# Patient Record
Sex: Female | Born: 1975 | Race: White | Hispanic: Refuse to answer | Marital: Single | Smoking: Former smoker
Health system: Southern US, Community
[De-identification: ages and names within clinical notes are randomized; demographics above are authoritative.]

## PROBLEM LIST (undated history)

## (undated) DIAGNOSIS — Z9109 Other allergy status, other than to drugs and biological substances: Secondary | ICD-10-CM

## (undated) DIAGNOSIS — E7800 Pure hypercholesterolemia, unspecified: Secondary | ICD-10-CM

## (undated) DIAGNOSIS — R002 Palpitations: Secondary | ICD-10-CM

## (undated) DIAGNOSIS — C801 Malignant (primary) neoplasm, unspecified: Secondary | ICD-10-CM

## (undated) DIAGNOSIS — F411 Generalized anxiety disorder: Secondary | ICD-10-CM

## (undated) DIAGNOSIS — N926 Irregular menstruation, unspecified: Secondary | ICD-10-CM

## (undated) DIAGNOSIS — D72829 Elevated white blood cell count, unspecified: Secondary | ICD-10-CM

## (undated) DIAGNOSIS — I10 Essential (primary) hypertension: Secondary | ICD-10-CM

## (undated) DIAGNOSIS — N2889 Other specified disorders of kidney and ureter: Secondary | ICD-10-CM

## (undated) DIAGNOSIS — E119 Type 2 diabetes mellitus without complications: Secondary | ICD-10-CM

## (undated) DIAGNOSIS — D75839 Thrombocytosis, unspecified: Secondary | ICD-10-CM

## (undated) DIAGNOSIS — K219 Gastro-esophageal reflux disease without esophagitis: Secondary | ICD-10-CM

## (undated) DIAGNOSIS — N8501 Benign endometrial hyperplasia: Secondary | ICD-10-CM

## (undated) DIAGNOSIS — D689 Coagulation defect, unspecified: Secondary | ICD-10-CM

## (undated) DIAGNOSIS — G4700 Insomnia, unspecified: Secondary | ICD-10-CM

## (undated) DIAGNOSIS — T7840XA Allergy, unspecified, initial encounter: Secondary | ICD-10-CM

## (undated) DIAGNOSIS — R197 Diarrhea, unspecified: Secondary | ICD-10-CM

## (undated) DIAGNOSIS — F329 Major depressive disorder, single episode, unspecified: Secondary | ICD-10-CM

## (undated) DIAGNOSIS — E559 Vitamin D deficiency, unspecified: Secondary | ICD-10-CM

## (undated) DIAGNOSIS — G709 Myoneural disorder, unspecified: Secondary | ICD-10-CM

## (undated) DIAGNOSIS — D509 Iron deficiency anemia, unspecified: Secondary | ICD-10-CM

## (undated) DIAGNOSIS — R200 Anesthesia of skin: Secondary | ICD-10-CM

## (undated) DIAGNOSIS — C649 Malignant neoplasm of unspecified kidney, except renal pelvis: Secondary | ICD-10-CM

## (undated) DIAGNOSIS — D649 Anemia, unspecified: Secondary | ICD-10-CM

## (undated) DIAGNOSIS — N8502 Endometrial intraepithelial neoplasia [EIN]: Secondary | ICD-10-CM

## (undated) DIAGNOSIS — R202 Paresthesia of skin: Secondary | ICD-10-CM

## (undated) HISTORY — DX: Allergy, unspecified, initial encounter: T78.40XA

## (undated) HISTORY — DX: Endometrial intraepithelial neoplasia (EIN): N85.02

## (undated) HISTORY — DX: Myoneural disorder, unspecified: G70.9

## (undated) HISTORY — PX: COLONOSCOPY: SHX174

## (undated) HISTORY — DX: Coagulation defect, unspecified: D68.9

## (undated) HISTORY — DX: Generalized anxiety disorder: F41.1

## (undated) HISTORY — DX: Type 2 diabetes mellitus without complications: E11.9

## (undated) HISTORY — DX: Essential (primary) hypertension: I10

## (undated) HISTORY — DX: Gastro-esophageal reflux disease without esophagitis: K21.9

## (undated) HISTORY — DX: Pure hypercholesterolemia, unspecified: E78.00

## (undated) HISTORY — PX: DILATION AND CURETTAGE OF UTERUS: SHX78

## (undated) HISTORY — DX: Other allergy status, other than to drugs and biological substances: Z91.09

## (undated) HISTORY — DX: Major depressive disorder, single episode, unspecified: F32.9

## (undated) HISTORY — DX: Insomnia, unspecified: G47.00

## (undated) HISTORY — PX: HYSTEROSCOPY WITH D & C: SHX1775

---

## 2005-03-16 ENCOUNTER — Emergency Department: Payer: Self-pay | Admitting: Emergency Medicine

## 2005-03-16 ENCOUNTER — Other Ambulatory Visit: Payer: Self-pay

## 2005-08-25 LAB — HM COLONOSCOPY

## 2006-01-30 ENCOUNTER — Ambulatory Visit: Payer: Self-pay | Admitting: Emergency Medicine

## 2006-01-30 ENCOUNTER — Emergency Department: Payer: Self-pay | Admitting: Emergency Medicine

## 2006-02-04 ENCOUNTER — Ambulatory Visit: Payer: Self-pay | Admitting: Urology

## 2006-03-21 ENCOUNTER — Ambulatory Visit: Payer: Self-pay | Admitting: Gastroenterology

## 2007-01-21 IMAGING — CT CT ABD-PELV W/O CM
1 of 2 series · 14 of 32 positions shown, 18 images · non-contrast
Comparison: none

REASON FOR EXAM: kidney stone
COMMENTS:

[Series 2: soft tissue · axial · 0.79mm/px · z∈[-503,-68]mm · 14 of 165 slices shown, 18 images]
[im 13/165  soft-tissue]
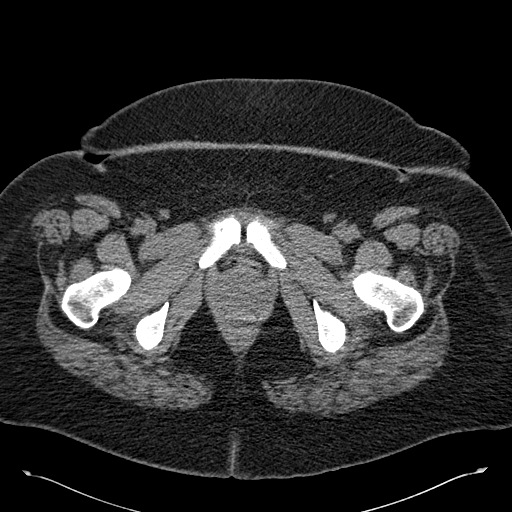
[im 13/165  bone]
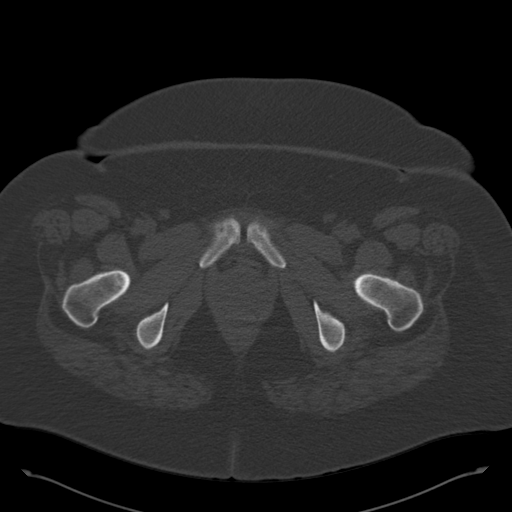
[im 25/165  soft-tissue]
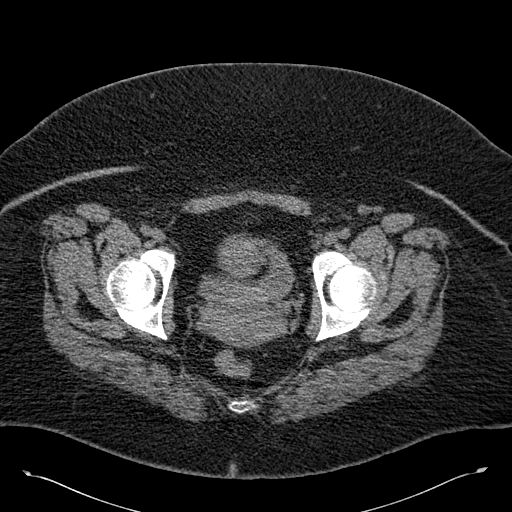
[im 37/165  soft-tissue]
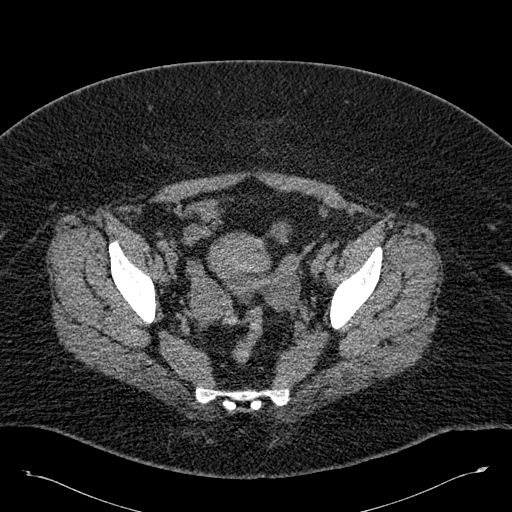
[im 49/165  soft-tissue]
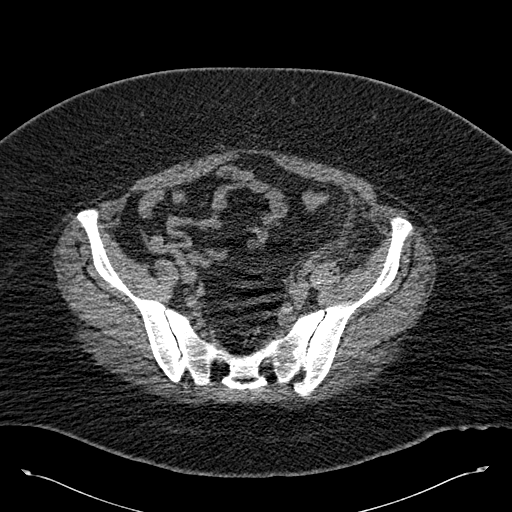
[im 61/165  soft-tissue]
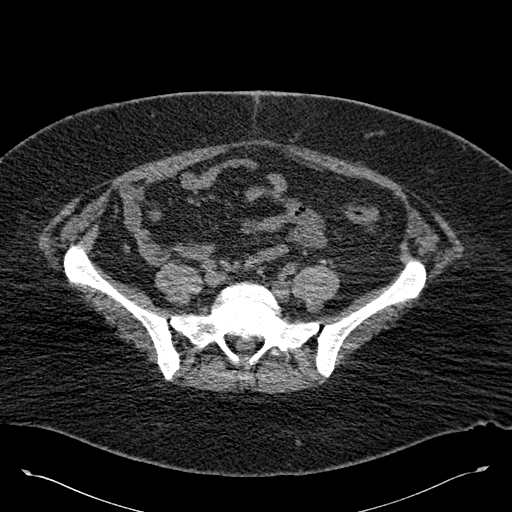
[im 73/165  soft-tissue]
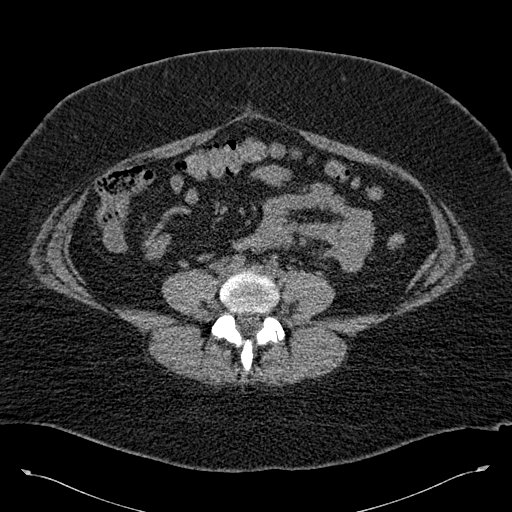
[im 92/165  soft-tissue]
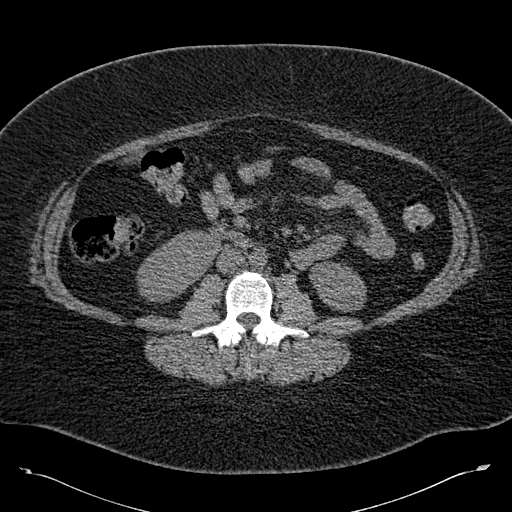
[im 104/165  soft-tissue]
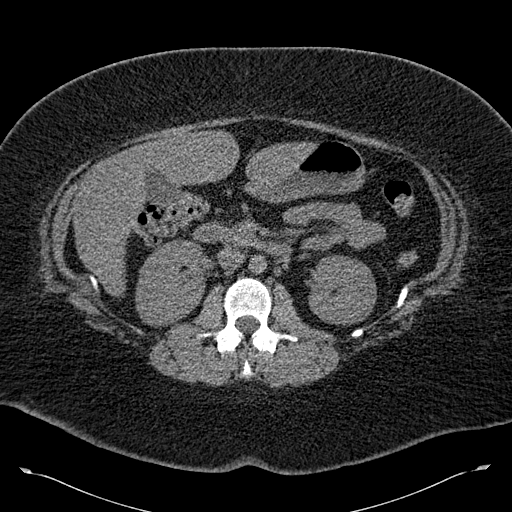
[im 116/165  soft-tissue]
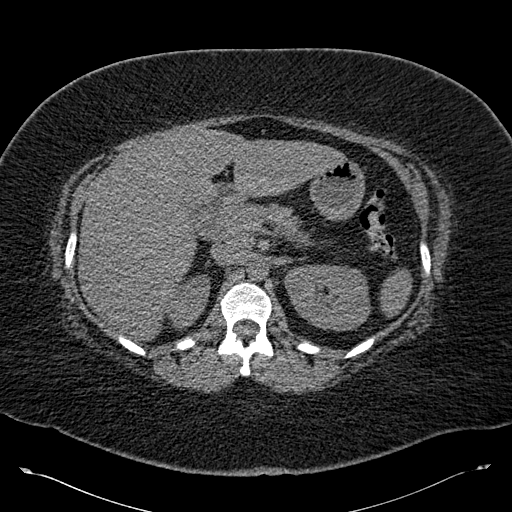
[im 116/165  bone]
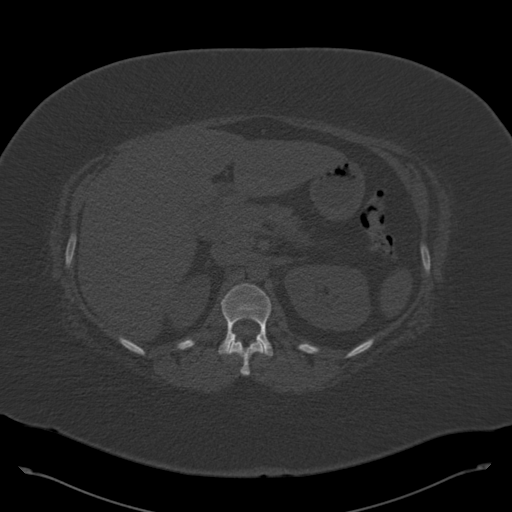
[im 128/165  soft-tissue]
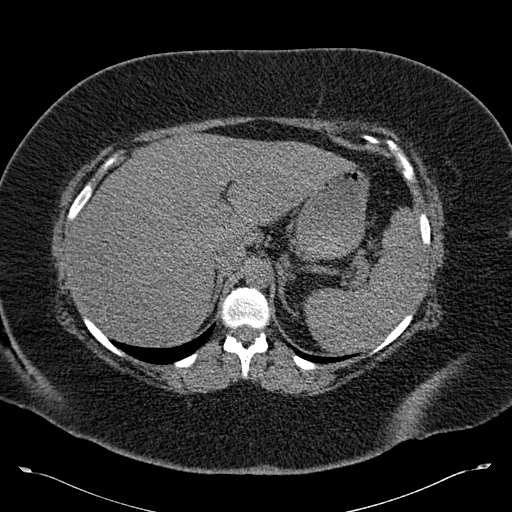
[im 140/165  soft-tissue]
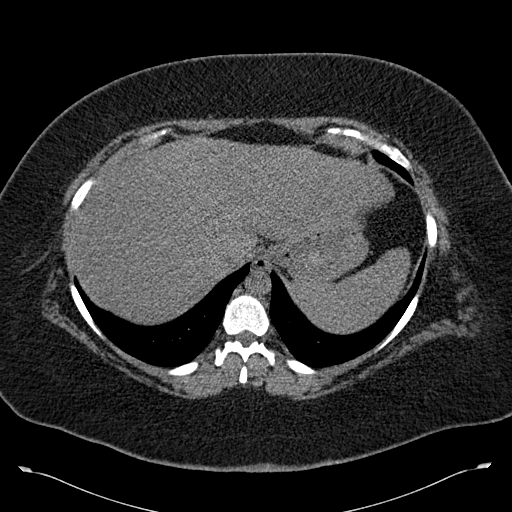
[im 140/165  lung]
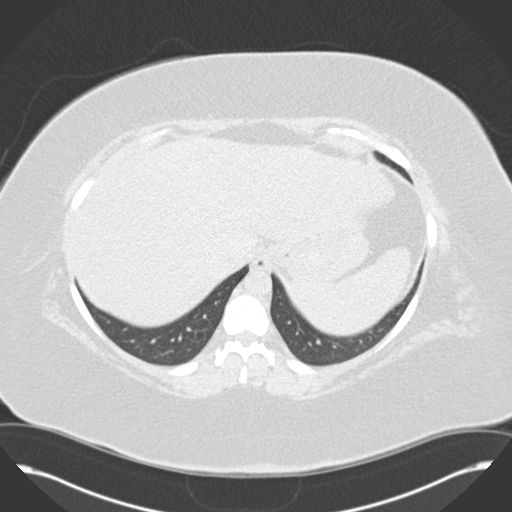
[im 146/165  lung]
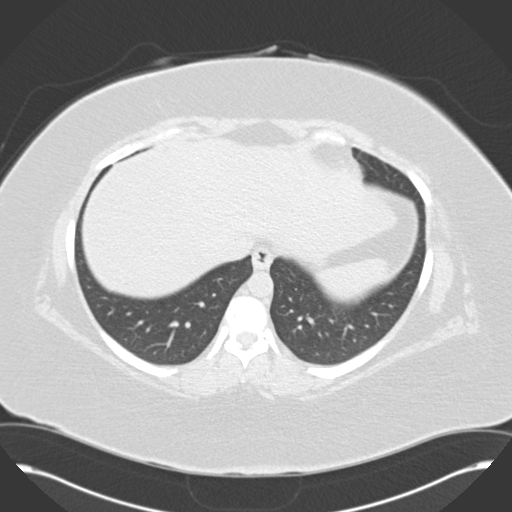
[im 152/165  soft-tissue]
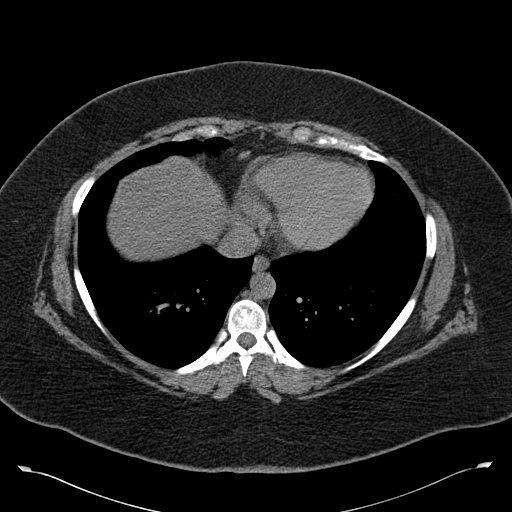
[im 152/165  lung]
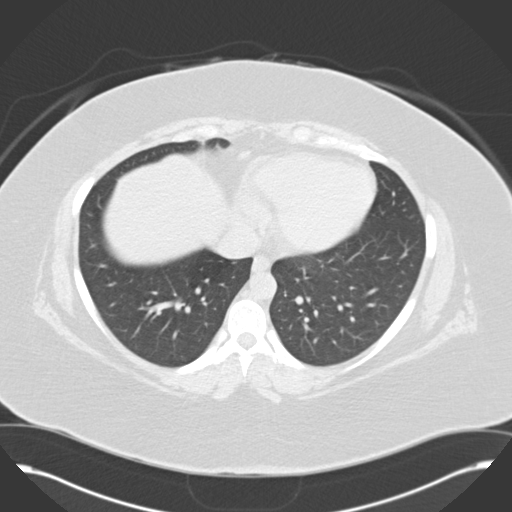
[im 158/165  lung]
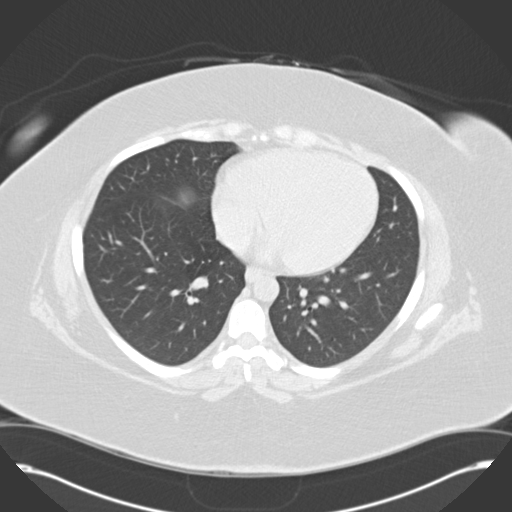

[14 of 32 positions shown; findings below may reference images not displayed]

PROCEDURE:     CT  - CT ABDOMEN AND PELVIS W[DATE] [DATE]

RESULT:     Spiral 3 mm sections were obtained from the lung bases through
the pubic symphysis without the administration of oral or intravenous
contrast.

This study is degraded secondary to the patient's body habitus. The patient
is morbidly obese.

Evaluation of the lung bases demonstrates no gross abnormalities.

Evaluation of the LEFT kidney demonstrates no evidence of hydronephrosis nor
gross evidence of renal masses nor calculi.  The ureter is fully identified
secondary to the decreased spatial resolution of this study due to the
patient's morbid obesity.  Within the region of the distal LEFT ureter at
the level of the pelvic inlet a small focal area of high-density material
projects measuring approximately .4 mm in diameter. The findings are
suspicious for a non-obstructing distal LEFT ureteral calculus.  There is no
evidence of hydroureter nor further regions of LEFT ureterolithiasis.
Evaluation of the RIGHT kidney demonstrates no evidence of hydronephrosis,
masses or calculi.  There is no evidence of RIGHT ureterolithiasis nor
hydronephrosis.

Non-contrast evaluation of the liver, spleen, adrenals, pancreas is
unremarkable.  The LEFT lower quadrant/upper pelvis region demonstrates
minimal diverticulosis involving the distal descending/upper sigmoid colon.
There is minimal inflammatory change within the surrounding mesenteric fat
as well as a small amount of fluid.  This finding may represent the sequela
of early or mild diverticulitis.  No drainable loculated fluid collections
are identified and clinical correlation is recommended.
IMPRESSION: 1)Findings suspicious for a distal LEFT ureteral calculus as described
above. There is no evidence of significant obstructive associated
obstructive uropathy.

2)Findings which raise the suspicion of early or mild diverticulitis
involving the proximal sigmoid colon.  As noted above this study is degraded
secondary to body habitus.  No drainable loculated fluid collections are
associated with the region of mild/early changes within the LEFT lower
quadrant. Alternatively, this region may represent an area of resolving
diverticulitis particularly if the patient has received antibiotic therapy.

## 2012-06-06 ENCOUNTER — Telehealth: Payer: Self-pay | Admitting: Internal Medicine

## 2012-06-06 NOTE — Telephone Encounter (Signed)
Ok x 2  

## 2012-06-06 NOTE — Telephone Encounter (Signed)
Patient needing refills until she can be seen. Losartan 50 mg 1 time a day.

## 2012-06-12 ENCOUNTER — Other Ambulatory Visit: Payer: Self-pay | Admitting: *Deleted

## 2012-06-12 NOTE — Telephone Encounter (Signed)
Left message with patient requesting pharmacy name

## 2012-06-16 NOTE — Telephone Encounter (Signed)
Dr. Lorin Picket took care of this per note she gave me.

## 2012-06-18 ENCOUNTER — Telehealth: Payer: Self-pay | Admitting: *Deleted

## 2012-06-18 ENCOUNTER — Encounter: Payer: Self-pay | Admitting: *Deleted

## 2012-06-18 NOTE — Telephone Encounter (Signed)
No answer from patient home phone in regards to pharmacy name for Losartan script

## 2012-06-26 ENCOUNTER — Other Ambulatory Visit: Payer: Self-pay | Admitting: Internal Medicine

## 2012-06-26 NOTE — Telephone Encounter (Signed)
Pt is needing a refill on Metformin. She uses [address]

## 2012-06-26 NOTE — Telephone Encounter (Signed)
Ok to refill x 3.  Will need to clarify the directions with the pt or [facility].

## 2012-06-27 ENCOUNTER — Telehealth: Payer: Self-pay | Admitting: Internal Medicine

## 2012-06-27 NOTE — Telephone Encounter (Signed)
Pt called back and says if we could check with the pharmacy on the directions to clarify and she will be out of pills tonight.

## 2012-06-27 NOTE — Telephone Encounter (Signed)
Please call [facility] and confirm dose of med she is taking and ok to refill x 3.  Pt will be out tonight.

## 2012-06-27 NOTE — Telephone Encounter (Signed)
Called into pharmacy

## 2012-06-27 NOTE — Telephone Encounter (Signed)
error 

## 2012-08-05 ENCOUNTER — Other Ambulatory Visit: Payer: Self-pay | Admitting: Internal Medicine

## 2012-08-05 ENCOUNTER — Other Ambulatory Visit: Payer: Self-pay | Admitting: *Deleted

## 2012-08-05 MED ORDER — AMLODIPINE BESYLATE 5 MG PO TABS: 5.0000 mg | ORAL_TABLET | Freq: Two times a day (BID) | ORAL | Status: DC

## 2012-08-05 NOTE — Telephone Encounter (Signed)
Filled script for med until appoint.with Dr. Lorin Picket

## 2012-08-05 NOTE — Telephone Encounter (Signed)
Ok to refill until her appt.  Will need to clarify with pharmacy how she takes - I assume she is taking 5mg  q day, but need to confirm

## 2012-08-05 NOTE — Telephone Encounter (Signed)
Pt is needing refill on Amlodapine 5 mg. She uses [facility] on [address]

## 2012-08-22 ENCOUNTER — Encounter: Payer: Self-pay | Admitting: Internal Medicine

## 2012-08-25 ENCOUNTER — Encounter: Payer: Self-pay | Admitting: Internal Medicine

## 2012-08-25 ENCOUNTER — Ambulatory Visit (INDEPENDENT_AMBULATORY_CARE_PROVIDER_SITE_OTHER): Payer: BC Managed Care – PPO | Admitting: Internal Medicine

## 2012-08-25 VITALS — BP 140/70 | HR 105 | Temp 99.0°F | Ht 67.5 in | Wt 301.8 lb

## 2012-08-25 DIAGNOSIS — I10 Essential (primary) hypertension: Secondary | ICD-10-CM | POA: Insufficient documentation

## 2012-08-25 DIAGNOSIS — K219 Gastro-esophageal reflux disease without esophagitis: Secondary | ICD-10-CM | POA: Insufficient documentation

## 2012-08-25 DIAGNOSIS — R5383 Other fatigue: Secondary | ICD-10-CM

## 2012-08-25 DIAGNOSIS — E119 Type 2 diabetes mellitus without complications: Secondary | ICD-10-CM

## 2012-08-25 DIAGNOSIS — E7800 Pure hypercholesterolemia, unspecified: Secondary | ICD-10-CM | POA: Insufficient documentation

## 2012-08-25 MED ORDER — FLUTICASONE PROPIONATE 50 MCG/ACT NA SUSP: 2.0000 | Freq: Every day | NASAL | Status: DC

## 2012-08-25 MED ORDER — AMLODIPINE BESYLATE 5 MG PO TABS: 5.0000 mg | ORAL_TABLET | Freq: Two times a day (BID) | ORAL | Status: DC

## 2012-08-26 ENCOUNTER — Telehealth: Payer: Self-pay | Admitting: Internal Medicine

## 2012-08-26 MED ORDER — METOPROLOL SUCCINATE ER 100 MG PO TB24: 100.0000 mg | ORAL_TABLET | Freq: Every day | ORAL | Status: DC

## 2012-08-26 NOTE — Telephone Encounter (Signed)
Sent in to pharmacy.  

## 2012-08-26 NOTE — Telephone Encounter (Signed)
metoprolol succinate (TOPROL-XL) 100 MG 24 hr tablet   Patient is out of he medication needing refill called in today.

## 2012-09-04 ENCOUNTER — Encounter: Payer: Self-pay | Admitting: Internal Medicine

## 2012-09-04 NOTE — Assessment & Plan Note (Signed)
Low cholesterol diet and exercise.  Check lipid panel.   

## 2012-09-04 NOTE — Progress Notes (Signed)
Subjective:    Patient ID: Lindsey Strickland, female    DOB: October 25, 1975, 37 y.o.   MRN: [id number]  HPI 37 year old female with past history of hypertension, hypercholesterolemia, GERD, diabetes and reoccurring allergy and sinus issues.  She comes in today for a scheduled follow up.  Declined to have her physical today.  States she will do her physical - next visit.  Does report some increased drainage.  No significant sinus pressure.  Request refill on her Flonase.  No chest congestion.  No increased cough or sob.  No significant acid reflux reported.  On Zantac.  Bowels stable.  No watching what she eats.  Has gained some of her weight back.  No urine change.  She does report some tingling in her toes.  Also will notice a sharp pain in the bottom of her feet.  Just occurs intermittently.  No precipitating factors.  Some fatigue, but overall she feels she is doing relatively well.  Plans to get more serious about her diet and exercise.    Past Medical History  Diagnosis Date  . Hypertension   . Hypercholesterolemia   . GERD (gastroesophageal reflux disease)   . Environmental allergies     Current Outpatient Prescriptions on File Prior to Visit  Medication Sig Dispense Refill  . amLODipine (NORVASC) 5 MG tablet Take 1 tablet (5 mg total) by mouth 2 (two) times daily.  60 tablet  4  . glucose blood test strip 1 each by Other route 2 (two) times daily. Use as instructed, use one strip twice a day      . Lancets MISC by Does not apply route. Use 1 lancets three times a day      . losartan (COZAAR) 50 MG tablet Take 50 mg by mouth daily.      . metFORMIN (GLUCOPHAGE) 500 MG tablet Take 500 mg by mouth 2 (two) times daily with a meal.       . ranitidine (ZANTAC) 150 MG tablet Take 150 mg by mouth once. Take 1 tablet by mouth once a day      . fluticasone (FLONASE) 50 MCG/ACT nasal spray Place 2 sprays into the nose daily.  16 g  3  . metoprolol succinate (TOPROL-XL) 100 MG 24 hr tablet 1/2 tablet  bid        Review of Systems Patient denies any headache, lightheadedness or dizziness. Some postnasal drainage.  No sinus pressure of fever/chills.  No chest pain, tightness or palpitations.  No increased shortness of breath, cough or congestion.  No nausea or vomiting.  No abdominal pain or cramping.  No bowel change, such as diarrhea, constipation, BRBPR or melana.  No urine change.        Objective:   Physical Exam Filed Vitals:   08/25/12 0809  BP: 140/70  Pulse: 105  Temp: 99 F (37.2 C)   Blood pressure recheck:  140/82, pulse 49  37 year old female in no acute distress.   HEENT:  Nares- clear.  Oropharynx - without lesions. NECK:  Supple.  Nontender.  No audible bruit.  HEART:  Appears to be regular. LUNGS:  No crackles or wheezing audible.  Respirations even and unlabored.  RADIAL PULSE:  Equal bilaterally.   ABDOMEN:  Soft, nontender.  Bowel sounds present and normal.  No audible abdominal bruit.    EXTREMITIES:  No increased edema present.  DP pulses palpable and equal bilaterally.  No lesions.  Non tender to palpation.  NEURO:  Sensation  intact to pinprick and light touch.            Assessment & Plan:  ALLERGIES.  Flonase as directed.  Follow.    GI.  Stable.  FATIGUE.  Check cbc, met c and tsh.     INCREASED PSYCHOSOCIAL STRESSORS.  Doing well.  Feels she is handling things relatively well.  Follow.    GYN.  Schedule pelvic and pap next visit.    FOOT PAIN.  No pain on exam today.  Some tingling.  Exam as outlined.  Support shoes.  Follow.    HEALTH MAINTENANCE.  Schedule her for a physical next visit.

## 2012-09-04 NOTE — Assessment & Plan Note (Signed)
Blood pressure a little elevated today.  Have her spot check her pressure.  Check metabolic panel.  Get her back in soon to reassess.  Make adjustments in her medication regimen - if needed.

## 2012-09-04 NOTE — Assessment & Plan Note (Signed)
Symptoms controlled.  On zantac.  Follow.    

## 2012-09-04 NOTE — Assessment & Plan Note (Signed)
On metformin.  Brought in no recorded sugar readings.  No watching her diet and not exercising.  Discussed diet, exercise and weight loss.  Check sugars.  Will check metabolic panel and a1c.

## 2012-09-05 ENCOUNTER — Telehealth: Payer: Self-pay | Admitting: Internal Medicine

## 2012-09-05 MED ORDER — LOSARTAN POTASSIUM 50 MG PO TABS: 50.0000 mg | ORAL_TABLET | Freq: Every day | ORAL | Status: DC

## 2012-09-05 NOTE — Telephone Encounter (Signed)
Msg. left on patient's voicemail that script has been called in per Dr. Lorin Picket to [facility]

## 2012-09-05 NOTE — Telephone Encounter (Signed)
Pt needing refill on Losartin 50 mg and she uses [facility] on [address] Pt on last pill

## 2012-09-05 NOTE — Telephone Encounter (Signed)
rx sent [facility] to [facility].  Can notify pt.

## 2012-09-29 ENCOUNTER — Ambulatory Visit: Payer: BC Managed Care – PPO | Admitting: Adult Health

## 2012-09-29 ENCOUNTER — Ambulatory Visit (INDEPENDENT_AMBULATORY_CARE_PROVIDER_SITE_OTHER): Payer: BC Managed Care – PPO | Admitting: Adult Health

## 2012-09-29 ENCOUNTER — Telehealth: Payer: Self-pay | Admitting: Internal Medicine

## 2012-09-29 ENCOUNTER — Encounter: Payer: Self-pay | Admitting: Adult Health

## 2012-09-29 VITALS — BP 154/78 | HR 92 | Temp 99.8°F | Resp 16 | Wt 299.0 lb

## 2012-09-29 DIAGNOSIS — J22 Unspecified acute lower respiratory infection: Secondary | ICD-10-CM

## 2012-09-29 MED ORDER — ALBUTEROL SULFATE HFA 108 (90 BASE) MCG/ACT IN AERS: 2.0000 | INHALATION_SPRAY | Freq: Four times a day (QID) | RESPIRATORY_TRACT | Status: DC | PRN

## 2012-09-29 MED ORDER — LEVOFLOXACIN 750 MG PO TABS: 750.0000 mg | ORAL_TABLET | Freq: Every day | ORAL | Status: DC

## 2012-09-29 NOTE — Patient Instructions (Addendum)
  Please stop the omnicef and start Levaquin. You will take this daily for 5 days.  Use the albuterol inhaler for the next 3 days. Use every 6 hours. Then use inhaler as needed for shortness of breath or wheezing.  Continue to drink fluids.  Please stay out of work until Thursday. If you are still running a fever, stay home.  If you are not better by Thursday, please call the office.

## 2012-09-29 NOTE — Telephone Encounter (Signed)
Patient Information:  Caller Name: Ashlen  Phone: [phone]  Patient: Lindsey Strickland, Lindsey Strickland  Gender: Female  DOB: Oct 22, 1975  Age: 37 Years  PCP: Dale Duncombe  Pregnant: No  Office Follow Up:  Does the office need to follow up with this patient?: No  Instructions For The Office: N/A  RN Note:  Pt diagnosed w/ Bronchitis on 2-21, Pt feels she is having SOB and Wheezing.  No audible wheezing heard over the phone, Pt speaking full sentences, seem anxious.  Pt has appt at 1545 on 2-24, Pt would like to come to office now.  No same day appts remaining.  Advised Pt to have herbal tea w/ honey and to keep 1545 appt on 2-24.  Pt verbalized understanding.   Symptoms  Reason For Call & Symptoms: ER CALL. Bronchitis follow up and Wheezing  Reviewed Health History In EMR: N/A  Reviewed Medications In EMR: N/A  Reviewed Allergies In EMR: N/A  Reviewed Surgeries / Procedures: N/A  Date of Onset of Symptoms: 09/26/2012  Any Fever: Yes  Fever Taken: Oral  Fever Time Of Reading: 14:00:08  Fever Last Reading: 100.5 OB / GYN:  LMP: Unknown  Guideline(s) Used:  Breathing Difficulty  Disposition Per Guideline:   Go to Office Now  Reason For Disposition Reached:   Mild difficulty breathing (e.g., minimal/no SOB at rest, SOB with walking, pulse < 100) of new onset or worse than normal  Advice Given:  N/A

## 2012-09-29 NOTE — Progress Notes (Signed)
  Subjective:    Patient ID: Lindsey Strickland, female    DOB: 06-Jul-1976, 37 y.o.   MRN: [id number]  HPI  Patient is a 37 y/o female with hx of DM, HTN, HLD who presents to office today following a visit to urgent care for cough, congestion, fever, chills this past Friday. She was diagnosed with bronchitis and was started on Omnicef 300 mg bid. She reports that her fever has actually been higher over the weekend (101.0). She has been using flonase and taking mucinex. Her sputum is green. She is not feeling any better. She is very concerned so she is here for f/u evaluation.   Current Outpatient Prescriptions on File Prior to Visit  Medication Sig Dispense Refill  . amLODipine (NORVASC) 5 MG tablet Take 1 tablet (5 mg total) by mouth 2 (two) times daily.  60 tablet  4  . fluticasone (FLONASE) 50 MCG/ACT nasal spray Place 2 sprays into the nose daily.  16 g  3  . glucose blood test strip 1 each by Other route 2 (two) times daily. Use as instructed, use one strip twice a day      . Lancets MISC by Does not apply route. Use 1 lancets three times a day      . losartan (COZAAR) 50 MG tablet Take 1 tablet (50 mg total) by mouth daily.  30 tablet  5  . metFORMIN (GLUCOPHAGE) 500 MG tablet Take 500 mg by mouth 2 (two) times daily with a meal.       . metoprolol succinate (TOPROL-XL) 100 MG 24 hr tablet 1/2 tablet bid      . ranitidine (ZANTAC) 150 MG tablet Take 150 mg by mouth once. Take 1 tablet by mouth once a day      . fluticasone (FLONASE) 50 MCG/ACT nasal spray Place 2 sprays into the nose daily.       No current facility-administered medications on file prior to visit.     Review of Systems  Constitutional: Positive for fever, chills and fatigue.  HENT: Positive for congestion. Negative for sore throat, rhinorrhea and sinus pressure.   Respiratory: Positive for cough, chest tightness and shortness of breath. Negative for wheezing.   Gastrointestinal: Negative for nausea and vomiting.     BP 154/78  Pulse 92  Temp(Src) 99.8 F (37.7 C) (Oral)  Resp 16  Wt 299 lb (135.626 kg)  BMI 46.11 kg/m2  SpO2 96%  LMP 09/25/2012     Objective:   Physical Exam  Constitutional: She is oriented to person, place, and time. No distress.  Overweight. Pleasant  Cardiovascular: Normal rate, regular rhythm and normal heart sounds.   Pulmonary/Chest: Effort normal. No respiratory distress.  Rhonchi RLL posteriorly. Does not clear with coughing.  Neurological: She is alert and oriented to person, place, and time.  Skin: Skin is warm and dry.  Psychiatric: She has a normal mood and affect. Her behavior is normal. Judgment and thought content normal.        Assessment & Plan:

## 2012-09-29 NOTE — Assessment & Plan Note (Addendum)
Rhonchi right lower lobe. Does not clear with coughing. Ordered albuterol nebulizer treatment while in clinic today. Stop Omnicef. Start Levaquin 500 mg x5 days. If no improvement by Thursday will send for chest x-ray.

## 2012-11-03 ENCOUNTER — Encounter: Payer: Self-pay | Admitting: Internal Medicine

## 2012-11-03 ENCOUNTER — Ambulatory Visit (INDEPENDENT_AMBULATORY_CARE_PROVIDER_SITE_OTHER): Payer: BC Managed Care – PPO | Admitting: Internal Medicine

## 2012-11-03 ENCOUNTER — Telehealth: Payer: Self-pay | Admitting: *Deleted

## 2012-11-03 ENCOUNTER — Other Ambulatory Visit (HOSPITAL_COMMUNITY)
Admission: RE | Admit: 2012-11-03 | Discharge: 2012-11-03 | Disposition: A | Discharge status: Home / Self Care | Payer: BC Managed Care – PPO | Source: Ambulatory Visit | Attending: Internal Medicine | Admitting: Internal Medicine

## 2012-11-03 VITALS — BP 140/80 | HR 94 | Temp 98.1°F | Ht 67.5 in | Wt 298.0 lb

## 2012-11-03 DIAGNOSIS — Z01419 Encounter for gynecological examination (general) (routine) without abnormal findings: Secondary | ICD-10-CM | POA: Insufficient documentation

## 2012-11-03 DIAGNOSIS — I10 Essential (primary) hypertension: Secondary | ICD-10-CM

## 2012-11-03 DIAGNOSIS — R079 Chest pain, unspecified: Secondary | ICD-10-CM

## 2012-11-03 DIAGNOSIS — K219 Gastro-esophageal reflux disease without esophagitis: Secondary | ICD-10-CM

## 2012-11-03 DIAGNOSIS — Z124 Encounter for screening for malignant neoplasm of cervix: Secondary | ICD-10-CM

## 2012-11-03 DIAGNOSIS — R5381 Other malaise: Secondary | ICD-10-CM

## 2012-11-03 DIAGNOSIS — E119 Type 2 diabetes mellitus without complications: Secondary | ICD-10-CM

## 2012-11-03 DIAGNOSIS — E7800 Pure hypercholesterolemia, unspecified: Secondary | ICD-10-CM

## 2012-11-03 DIAGNOSIS — R5383 Other fatigue: Secondary | ICD-10-CM

## 2012-11-03 DIAGNOSIS — Z1151 Encounter for screening for human papillomavirus (HPV): Secondary | ICD-10-CM | POA: Insufficient documentation

## 2012-11-03 LAB — CBC WITH DIFFERENTIAL/PLATELET
Basophils Relative: 0.3 % (ref 0.0–3.0)
Eosinophils Relative: 0.7 % (ref 0.0–5.0)
HCT: 39.3 % (ref 36.0–46.0)
Hemoglobin: 13.1 g/dL (ref 12.0–15.0)
Lymphs Abs: 2.5 10*3/uL (ref 0.7–4.0)
MCV: 81.1 fl (ref 78.0–100.0)
Monocytes Absolute: 0.4 10*3/uL (ref 0.1–1.0)
Monocytes Relative: 4 % (ref 3.0–12.0)
Neutro Abs: 8 10*3/uL — ABNORMAL HIGH (ref 1.4–7.7)
Platelets: 431 10*3/uL — ABNORMAL HIGH (ref 150.0–400.0)
WBC: 11 10*3/uL — ABNORMAL HIGH (ref 4.5–10.5)

## 2012-11-03 LAB — COMPREHENSIVE METABOLIC PANEL
ALT: 19 U/L (ref 0–35)
AST: 21 U/L (ref 0–37)
Albumin: 4.2 g/dL (ref 3.5–5.2)
Calcium: 9.6 mg/dL (ref 8.4–10.5)
Chloride: 98 mEq/L (ref 96–112)
Potassium: 4.5 mEq/L (ref 3.5–5.1)
Sodium: 136 mEq/L (ref 135–145)
Total Protein: 8.2 g/dL (ref 6.0–8.3)

## 2012-11-03 LAB — LDL CHOLESTEROL, DIRECT: Direct LDL: 183.1 mg/dL

## 2012-11-03 LAB — URINALYSIS, ROUTINE W REFLEX MICROSCOPIC
Ketones, ur: NEGATIVE
Leukocytes, UA: NEGATIVE
Specific Gravity, Urine: 1.02 (ref 1.000–1.030)
Urobilinogen, UA: 0.2 (ref 0.0–1.0)

## 2012-11-03 LAB — LIPID PANEL
Total CHOL/HDL Ratio: 7
Triglycerides: 296 mg/dL — ABNORMAL HIGH (ref 0.0–149.0)

## 2012-11-03 LAB — HEMOGLOBIN A1C: Hgb A1c MFr Bld: 6.7 % — ABNORMAL HIGH (ref 4.6–6.5)

## 2012-11-03 MED ORDER — LOSARTAN POTASSIUM 100 MG PO TABS: 100.0000 mg | ORAL_TABLET | Freq: Every day | ORAL | Status: DC

## 2012-11-03 NOTE — Telephone Encounter (Signed)
error 

## 2012-11-04 ENCOUNTER — Encounter: Payer: Self-pay | Admitting: Internal Medicine

## 2012-11-04 ENCOUNTER — Telehealth: Payer: Self-pay | Admitting: Internal Medicine

## 2012-11-04 NOTE — Assessment & Plan Note (Signed)
Blood pressure a little elevated today.  Will increase Losartan to 100mg  q day.  Check metabolic panel.  Get her back in soon to reassess.  Have her spot check her pressures.

## 2012-11-04 NOTE — Telephone Encounter (Signed)
Pt notified of lab results via my chart messaging.

## 2012-11-04 NOTE — Assessment & Plan Note (Signed)
On metformin.  Brought in no recorded sugar readings.  Not watching her diet.  Has started exercising.   Discussed diet, exercise and weight loss.  Check sugars.  Will check metabolic panel and a1c.

## 2012-11-04 NOTE — Assessment & Plan Note (Signed)
Low cholesterol diet and exercise.  Check lipid panel.   

## 2012-11-04 NOTE — Assessment & Plan Note (Signed)
Symptoms controlled.  On zantac.  Follow.    

## 2012-11-04 NOTE — Progress Notes (Signed)
Subjective:    Patient ID: Lindsey Strickland, female    DOB: [birth year], 37 y.o.   MRN: [id number]  HPI 37 year old female with past history of hypertension, hypercholesterolemia, GERD, diabetes and reoccurring allergy and sinus issues.  She comes in today to follow up on these issues as well as for a complete physical exam.  No chest congestion.  No increased cough or sob.  Recently saw Ra[facility]. Treated for a respiratory infection.  Symptoms have improved.  Still uses her inhaler occasionally.  No significant acid reflux reported.  On Zantac.  Bowels stable.  Not watching what she eats.  Plans to get more serious about her diet.  Has joined a gym.  Exercising now. No urine change.  She has noticed some chest pain and soreness.  Started a few days ago.  States she did do a lot of heavy lifting, etc.   Movement - does appear to aggravate the pain.  No pain with deep breathing.     Past Medical History  Diagnosis Date  . Hypertension   . Hypercholesterolemia   . GERD (gastroesophageal reflux disease)   . Environmental allergies     Current Outpatient Prescriptions on File Prior to Visit  Medication Sig Dispense Refill  . albuterol (PROVENTIL HFA;VENTOLIN HFA) 108 (90 BASE) MCG/ACT inhaler Inhale 2 puffs into the lungs every 6 (six) hours as needed for wheezing.  1 Inhaler  2  . amLODipine (NORVASC) 5 MG tablet Take 1 tablet (5 mg total) by mouth 2 (two) times daily.  60 tablet  4  . fluticasone (FLONASE) 50 MCG/ACT nasal spray Place 2 sprays into the nose daily.      . fluticasone (FLONASE) 50 MCG/ACT nasal spray Place 2 sprays into the nose daily.  16 g  3  . glucose blood test strip 1 each by Other route 2 (two) times daily. Use as instructed, use one strip twice a day      . Lancets MISC by Does not apply route. Use 1 lancets three times a day      . metFORMIN (GLUCOPHAGE) 500 MG tablet Take 500 mg by mouth 2 (two) times daily with a meal.       . metoprolol succinate (TOPROL-XL) 100 MG 24 hr  tablet 1/2 tablet bid      . ranitidine (ZANTAC) 150 MG tablet Take 150 mg by mouth once. Take 1 tablet by mouth once a day      . guaiFENesin (MUCINEX) 600 MG 12 hr tablet Take 1,200 mg by mouth 2 (two) times daily.      Marland Kitchen levofloxacin (LEVAQUIN) 750 MG tablet Take 1 tablet (750 mg total) by mouth daily.  5 tablet  0   No current facility-administered medications on file prior to visit.    Review of Systems Patient denies any headache, lightheadedness or dizziness.  No sinus pressure or fever/chills.  No  tightness or palpitations.  Does report the chest discomfort as outlined.  No increased shortness of breath, cough or congestion. Does still use the inhaler prn.  No nausea or vomiting.  No increased acid reflux.  No abdominal pain or cramping.  No bowel change, such as diarrhea, constipation, BRBPR or melana.  No urine change.        Objective:   Physical Exam  Filed Vitals:   11/03/12 0807  BP: 140/80  Pulse: 94  Temp: 98.1 F (36.7 C)   Blood pressure recheck:  146/82, pulse 40  37 year old female  in no acute distress.   HEENT:  Nares- clear.  Oropharynx - without lesions. NECK:  Supple.  Nontender.  No audible bruit.  HEART:  Appears to be regular. LUNGS:  No crackles or wheezing audible.  Respirations even and unlabored.  CHEST WALL:  No significant tenderness to palpation.  Some reproducible soreness with rotation of her upper body.   RADIAL PULSE:  Equal bilaterally.    BREASTS:  No nipple discharge or nipple retraction present.  Could not appreciate any distinct nodules or axillary adenopathy.  ABDOMEN:  Soft, nontender.  Bowel sounds present and normal.  No audible abdominal bruit.  GU:  Normal external genitalia.  Vaginal vault without lesions.  Cervix identified.  Pap performed. Could not appreciate any adnexal masses or tenderness.   RECTAL:  No performed.     EXTREMITIES:  No increased edema present.  DP pulses palpable and equal bilaterally.           Assessment  & Plan:  ALLERGIES.  Flonase as directed.  Follow.  Previous congestion - resolved.    PULMONARY.  Uses the inhaler prn.  Follow.  If persistent issues, may need PFTs.  Appears to be doing well now.  Follow.    GI.  Stable.  INCREASED PSYCHOSOCIAL STRESSORS.  Doing well.  Feels she is handling things relatively well.  Follow.    GYN.  Pelvic/pap today.   HEALTH MAINTENANCE.  Physical today.

## 2012-11-07 ENCOUNTER — Telehealth: Payer: Self-pay | Admitting: Internal Medicine

## 2012-11-07 NOTE — Telephone Encounter (Signed)
Notified of normal pap via my chart message.

## 2012-11-11 ENCOUNTER — Telehealth: Payer: Self-pay | Admitting: Internal Medicine

## 2012-11-11 NOTE — Telephone Encounter (Signed)
I reviewed my chart message from pt.  She had questions regarding starting a cholesterol medication.  Need to know what questions she has.  I can also see her 11/12/12 at 12:00 to discuss cholesterol and starting medication.

## 2012-11-11 NOTE — Telephone Encounter (Signed)
Left message on patient cell phone voicemail for the patient to return phone call.

## 2012-11-14 ENCOUNTER — Telehealth: Payer: Self-pay | Admitting: General Practice

## 2012-11-14 DIAGNOSIS — E7800 Pure hypercholesterolemia, unspecified: Secondary | ICD-10-CM

## 2012-11-14 MED ORDER — PRAVASTATIN SODIUM 10 MG PO TABS: 10.0000 mg | ORAL_TABLET | Freq: Every day | ORAL | Status: DC

## 2012-11-14 NOTE — Telephone Encounter (Signed)
Noted.  If she does not call back then I would send her a letter to call office.

## 2012-11-14 NOTE — Telephone Encounter (Signed)
I sent a prescription for pravastatin 10mg  q day.  She will need to come in for a blood test in 6 weeks.  This is a non fasting lab.  Please schedule lab appt.

## 2012-11-14 NOTE — Addendum Note (Signed)
Addended by: Charm Barges on: 11/14/2012 01:07 PM   Modules accepted: Orders

## 2012-11-14 NOTE — Telephone Encounter (Signed)
Called pt back to inform her that she will need her non fasting lab in 6 weeks. She did not want me to set up the appointment she said she would schedule it herself.

## 2012-11-14 NOTE — Telephone Encounter (Signed)
Left a voicemail message for the 2nd time for patient to return call to office about her question about cholesterol medication.

## 2012-11-14 NOTE — Telephone Encounter (Signed)
Pt called back had been concerned. That if she started a cholesterol medication that she would be on it for life. Pt was advised that with proper diet and exercise she may be able to come off the medication. Stated she was ok to begin the cholesterol medication.

## 2012-11-24 ENCOUNTER — Telehealth: Payer: Self-pay | Admitting: Internal Medicine

## 2012-11-24 MED ORDER — METOPROLOL SUCCINATE ER 100 MG PO TB24: 100.0000 mg | ORAL_TABLET | Freq: Two times a day (BID) | ORAL | Status: DC

## 2012-11-24 NOTE — Telephone Encounter (Signed)
Metoprolol.  Pt is completely out of refills and needs this called in today.  [facility] [address]

## 2012-11-24 NOTE — Telephone Encounter (Signed)
Refilled pt medication [facility] to [facility] [address]

## 2012-11-26 ENCOUNTER — Telehealth: Payer: Self-pay | Admitting: *Deleted

## 2012-11-26 NOTE — Telephone Encounter (Signed)
Phone call from pt, stating [facility] has not filled her Metoprolol due to a question about the directions. No calls have come in from [place]. Called and spoke with [facility], and verified the directions Metoprolol 100 mg 1/2 tab BID. Advised pt that pharmacy was called and directions were clarified.

## 2012-12-19 ENCOUNTER — Ambulatory Visit: Payer: BC Managed Care – PPO | Admitting: Internal Medicine

## 2013-01-02 ENCOUNTER — Encounter: Payer: Self-pay | Admitting: Internal Medicine

## 2013-01-02 ENCOUNTER — Ambulatory Visit (INDEPENDENT_AMBULATORY_CARE_PROVIDER_SITE_OTHER): Payer: BC Managed Care – PPO | Admitting: Internal Medicine

## 2013-01-02 VITALS — BP 122/82 | HR 94 | Temp 98.6°F | Ht 67.5 in | Wt 301.5 lb

## 2013-01-02 DIAGNOSIS — D72829 Elevated white blood cell count, unspecified: Secondary | ICD-10-CM

## 2013-01-02 DIAGNOSIS — D473 Essential (hemorrhagic) thrombocythemia: Secondary | ICD-10-CM

## 2013-01-02 DIAGNOSIS — J22 Unspecified acute lower respiratory infection: Secondary | ICD-10-CM

## 2013-01-02 DIAGNOSIS — D75839 Thrombocytosis, unspecified: Secondary | ICD-10-CM

## 2013-01-02 DIAGNOSIS — J988 Other specified respiratory disorders: Secondary | ICD-10-CM

## 2013-01-02 DIAGNOSIS — I10 Essential (primary) hypertension: Secondary | ICD-10-CM

## 2013-01-02 DIAGNOSIS — E119 Type 2 diabetes mellitus without complications: Secondary | ICD-10-CM

## 2013-01-02 DIAGNOSIS — K219 Gastro-esophageal reflux disease without esophagitis: Secondary | ICD-10-CM

## 2013-01-02 DIAGNOSIS — E7800 Pure hypercholesterolemia, unspecified: Secondary | ICD-10-CM

## 2013-01-02 LAB — CBC WITH DIFFERENTIAL/PLATELET
Eosinophils Relative: 0.9 % (ref 0.0–5.0)
HCT: 37.4 % (ref 36.0–46.0)
Lymphocytes Relative: 28.2 % (ref 12.0–46.0)
Lymphs Abs: 3.5 10*3/uL (ref 0.7–4.0)
Monocytes Relative: 5.4 % (ref 3.0–12.0)
Platelets: 429 10*3/uL — ABNORMAL HIGH (ref 150.0–400.0)
WBC: 12.5 10*3/uL — ABNORMAL HIGH (ref 4.5–10.5)

## 2013-01-04 ENCOUNTER — Encounter: Payer: Self-pay | Admitting: Internal Medicine

## 2013-01-04 ENCOUNTER — Telehealth: Payer: Self-pay | Admitting: Internal Medicine

## 2013-01-04 DIAGNOSIS — D72829 Elevated white blood cell count, unspecified: Secondary | ICD-10-CM

## 2013-01-04 DIAGNOSIS — D473 Essential (hemorrhagic) thrombocythemia: Secondary | ICD-10-CM | POA: Insufficient documentation

## 2013-01-04 NOTE — Assessment & Plan Note (Signed)
Symptoms controlled.  On zantac.  Follow.    

## 2013-01-04 NOTE — Progress Notes (Signed)
Subjective:    Patient ID: Lindsey Strickland, female    DOB: Oct 27, 1975, 37 y.o.   MRN: [id number]  HPI 37 year old female with past history of hypertension, hypercholesterolemia, GERD, diabetes and reoccurring allergy and sinus issues.  She comes in today for a scheduled follow up.  No chest congestion.  No increased cough or sob.  No significant acid reflux reported.  On Zantac.  Bowels stable.  Doing some better - watching what she eats.  Plans to get more serious about her diet.  Has joined a gym.  Exercising now.  Plans to get more regular with this.  No urine change.  Bowels stable.  Taking the 100mg  of the Losartan.  Tolerating.  Has not started the pravastatin.      Past Medical History  Diagnosis Date  . Hypertension   . Hypercholesterolemia   . GERD (gastroesophageal reflux disease)   . Environmental allergies     Current Outpatient Prescriptions on File Prior to Visit  Medication Sig Dispense Refill  . albuterol (PROVENTIL HFA;VENTOLIN HFA) 108 (90 BASE) MCG/ACT inhaler Inhale 2 puffs into the lungs every 6 (six) hours as needed for wheezing.  1 Inhaler  2  . amLODipine (NORVASC) 5 MG tablet Take 1 tablet (5 mg total) by mouth 2 (two) times daily.  60 tablet  4  . fluticasone (FLONASE) 50 MCG/ACT nasal spray Place 2 sprays into the nose daily.  16 g  3  . glucose blood test strip 1 each by Other route 2 (two) times daily. Use as instructed, use one strip twice a day      . guaiFENesin (MUCINEX) 600 MG 12 hr tablet Take 1,200 mg by mouth 2 (two) times daily as needed.       . Lancets MISC by Does not apply route. Use 1 lancets three times a day      . losartan (COZAAR) 100 MG tablet Take 1 tablet (100 mg total) by mouth daily.  30 tablet  3  . metFORMIN (GLUCOPHAGE) 500 MG tablet Take 500 mg by mouth 2 (two) times daily with a meal.       . metoprolol succinate (TOPROL-XL) 100 MG 24 hr tablet Take 50 mg by mouth 2 (two) times daily.      . pravastatin (PRAVACHOL) 10 MG tablet Take  1 tablet (10 mg total) by mouth daily.  30 tablet  1  . ranitidine (ZANTAC) 150 MG tablet Take 150 mg by mouth once. Take 1 tablet by mouth once a day       No current facility-administered medications on file prior to visit.    Review of Systems Patient denies any headache, lightheadedness or dizziness.  No sinus pressure or fever/chills.  No  tightness or palpitations.  No chest pain.   No increased shortness of breath, cough or congestion.  No nausea or vomiting.  No increased acid reflux.  No abdominal pain or cramping.  No bowel change, such as diarrhea, constipation, BRBPR or melana.  No urine change.  Not checking her sugars.  Taking her medication.  Not taking the pravastatin.  Has not started.        Objective:   Physical Exam  Filed Vitals:   01/02/13 1100  BP: 122/82  Pulse: 94  Temp: 98.6 F (37 C)   37 year old female in no acute distress.   HEENT:  Nares- clear.  Oropharynx - without lesions. NECK:  Supple.  Nontender.  No audible bruit.  HEART:  Appears to be regular. LUNGS:  No crackles or wheezing audible.  Respirations even and unlabored.   RADIAL PULSE:  Equal bilaterally.  ABDOMEN:  Soft, nontender.  Bowel sounds present and normal.  No audible abdominal bruit.    EXTREMITIES:  No increased edema present.  DP pulses palpable and equal bilaterally.           Assessment & Plan:  ALLERGIES.  Flonase as directed.  Follow.  Previous congestion - resolved.    PULMONARY. Breathing is doing well.  Follow.    GI.  Stable.  INCREASED PSYCHOSOCIAL STRESSORS.  Doing well.  Feels she is handling things relatively well.  Follow.   HEALTH MAINTENANCE.  Physical 11/03/12.

## 2013-01-04 NOTE — Assessment & Plan Note (Signed)
Platelet count slightly elevated.  Will recheck today.

## 2013-01-04 NOTE — Assessment & Plan Note (Signed)
On metformin.  Brought in no recorded sugar readings.  Doing some  Better -  watching her diet.  Has started exercising some. Plans to get more in a routine.   Discussed diet, exercise and weight loss.  Check sugars.

## 2013-01-04 NOTE — Assessment & Plan Note (Signed)
Low cholesterol diet and exercise.  Have instructed her to start pravastatin.  She has not started at this point.  Plans to start.  Follow.

## 2013-01-04 NOTE — Assessment & Plan Note (Signed)
Resolved

## 2013-01-04 NOTE — Assessment & Plan Note (Signed)
On Losartan 100mg q day.  Follow metabolic panel.  Blood pressure better.   

## 2013-01-04 NOTE — Telephone Encounter (Signed)
Pt notified of labs via my chart.   Needs a f/u cbc in 3 weeks.  Please schedule her for a non fasting lab appt in 3 weeks and call her with appt date and time.  Thanks.

## 2013-01-04 NOTE — Assessment & Plan Note (Signed)
No evidence of infection.  Recheck cbc today.

## 2013-01-05 ENCOUNTER — Encounter: Payer: Self-pay | Admitting: Internal Medicine

## 2013-01-05 NOTE — Telephone Encounter (Signed)
Sent my chart message letting pt know about appointment °

## 2013-01-26 ENCOUNTER — Encounter: Payer: Self-pay | Admitting: Internal Medicine

## 2013-01-26 ENCOUNTER — Other Ambulatory Visit (INDEPENDENT_AMBULATORY_CARE_PROVIDER_SITE_OTHER): Payer: BC Managed Care – PPO

## 2013-01-26 ENCOUNTER — Other Ambulatory Visit: Payer: BC Managed Care – PPO

## 2013-01-26 DIAGNOSIS — E7800 Pure hypercholesterolemia, unspecified: Secondary | ICD-10-CM

## 2013-01-26 DIAGNOSIS — D72829 Elevated white blood cell count, unspecified: Secondary | ICD-10-CM

## 2013-01-26 LAB — CBC WITH DIFFERENTIAL/PLATELET
Basophils Absolute: 0 10*3/uL (ref 0.0–0.1)
Eosinophils Absolute: 0.1 10*3/uL (ref 0.0–0.7)
Lymphs Abs: 2.3 10*3/uL (ref 0.7–4.0)
MCHC: 33 g/dL (ref 30.0–36.0)
MCV: 82.2 fl (ref 78.0–100.0)
Monocytes Absolute: 0.5 10*3/uL (ref 0.1–1.0)
Neutrophils Relative %: 68.1 % (ref 43.0–77.0)
Platelets: 423 10*3/uL — ABNORMAL HIGH (ref 150.0–400.0)
RDW: 14.2 % (ref 11.5–14.6)
WBC: 9.6 10*3/uL (ref 4.5–10.5)

## 2013-01-26 LAB — HEPATIC FUNCTION PANEL
ALT: 16 U/L (ref 0–35)
AST: 16 U/L (ref 0–37)
Alkaline Phosphatase: 62 U/L (ref 39–117)
Bilirubin, Direct: 0 mg/dL (ref 0.0–0.3)
Total Bilirubin: 0.3 mg/dL (ref 0.3–1.2)

## 2013-01-27 ENCOUNTER — Other Ambulatory Visit: Payer: Self-pay | Admitting: Internal Medicine

## 2013-01-27 ENCOUNTER — Other Ambulatory Visit: Payer: BC Managed Care – PPO

## 2013-01-27 ENCOUNTER — Encounter: Payer: Self-pay | Admitting: Internal Medicine

## 2013-01-27 DIAGNOSIS — E7800 Pure hypercholesterolemia, unspecified: Secondary | ICD-10-CM

## 2013-01-27 NOTE — Progress Notes (Signed)
Order placed for f/u liver panel.  

## 2013-02-02 ENCOUNTER — Other Ambulatory Visit: Payer: Self-pay | Admitting: Internal Medicine

## 2013-02-02 NOTE — Telephone Encounter (Signed)
Patient completley out of her medication . Needing her prescription called to the pharmacy today.  amLODipine (NORVASC) 5 MG tablet

## 2013-03-06 ENCOUNTER — Encounter: Payer: Self-pay | Admitting: Internal Medicine

## 2013-03-11 ENCOUNTER — Other Ambulatory Visit (INDEPENDENT_AMBULATORY_CARE_PROVIDER_SITE_OTHER): Payer: BC Managed Care – PPO

## 2013-03-11 ENCOUNTER — Encounter: Payer: Self-pay | Admitting: *Deleted

## 2013-03-11 ENCOUNTER — Other Ambulatory Visit: Payer: BC Managed Care – PPO

## 2013-03-11 DIAGNOSIS — E7800 Pure hypercholesterolemia, unspecified: Secondary | ICD-10-CM

## 2013-03-11 LAB — HEPATIC FUNCTION PANEL
ALT: 15 U/L (ref 0–35)
AST: 14 U/L (ref 0–37)
Albumin: 3.8 g/dL (ref 3.5–5.2)
Total Protein: 7.5 g/dL (ref 6.0–8.3)

## 2013-03-12 ENCOUNTER — Encounter: Payer: Self-pay | Admitting: Internal Medicine

## 2013-03-18 ENCOUNTER — Other Ambulatory Visit: Payer: Self-pay | Admitting: Internal Medicine

## 2013-03-23 ENCOUNTER — Other Ambulatory Visit: Payer: Self-pay | Admitting: *Deleted

## 2013-03-23 ENCOUNTER — Other Ambulatory Visit: Payer: Self-pay | Admitting: Internal Medicine

## 2013-03-23 MED ORDER — METFORMIN HCL 500 MG PO TABS: 500.0000 mg | ORAL_TABLET | Freq: Two times a day (BID) | ORAL | Status: DC

## 2013-04-07 ENCOUNTER — Ambulatory Visit: Payer: BC Managed Care – PPO | Admitting: Internal Medicine

## 2013-05-26 ENCOUNTER — Other Ambulatory Visit: Payer: Self-pay | Admitting: Internal Medicine

## 2013-05-26 MED ORDER — METOPROLOL SUCCINATE ER 100 MG PO TB24: ORAL_TABLET | ORAL | Status: DC

## 2013-05-28 ENCOUNTER — Ambulatory Visit: Payer: BC Managed Care – PPO | Admitting: Internal Medicine

## 2013-06-05 ENCOUNTER — Ambulatory Visit: Payer: BC Managed Care – PPO | Admitting: Internal Medicine

## 2013-06-11 ENCOUNTER — Other Ambulatory Visit: Payer: Self-pay

## 2013-07-03 ENCOUNTER — Encounter: Payer: Self-pay | Admitting: Internal Medicine

## 2013-07-03 ENCOUNTER — Ambulatory Visit (INDEPENDENT_AMBULATORY_CARE_PROVIDER_SITE_OTHER): Payer: BC Managed Care – PPO | Admitting: Internal Medicine

## 2013-07-03 ENCOUNTER — Ambulatory Visit (INDEPENDENT_AMBULATORY_CARE_PROVIDER_SITE_OTHER)
Admission: RE | Admit: 2013-07-03 | Discharge: 2013-07-03 | Disposition: A | Discharge status: Home / Self Care | Payer: BC Managed Care – PPO | Source: Ambulatory Visit | Attending: Internal Medicine | Admitting: Internal Medicine

## 2013-07-03 VITALS — BP 138/78 | HR 87 | Temp 97.8°F | Ht 67.5 in | Wt 304.5 lb

## 2013-07-03 DIAGNOSIS — R0602 Shortness of breath: Secondary | ICD-10-CM

## 2013-07-03 DIAGNOSIS — D473 Essential (hemorrhagic) thrombocythemia: Secondary | ICD-10-CM

## 2013-07-03 DIAGNOSIS — I10 Essential (primary) hypertension: Secondary | ICD-10-CM

## 2013-07-03 DIAGNOSIS — E7800 Pure hypercholesterolemia, unspecified: Secondary | ICD-10-CM

## 2013-07-03 DIAGNOSIS — K219 Gastro-esophageal reflux disease without esophagitis: Secondary | ICD-10-CM

## 2013-07-03 DIAGNOSIS — E119 Type 2 diabetes mellitus without complications: Secondary | ICD-10-CM

## 2013-07-03 DIAGNOSIS — D72829 Elevated white blood cell count, unspecified: Secondary | ICD-10-CM

## 2013-07-03 DIAGNOSIS — R0789 Other chest pain: Secondary | ICD-10-CM

## 2013-07-03 NOTE — Progress Notes (Signed)
Pre-visit discussion using our clinic review tool. No additional management support is needed unless otherwise documented below in the visit note.  

## 2013-07-03 NOTE — Progress Notes (Signed)
Subjective:    Patient ID: Lindsey Strickland, female    DOB: [birth year], 37 y.o.   MRN: [id number]  HPI 37 year old female with past history of hypertension, hypercholesterolemia, GERD, diabetes and reoccurring allergy and sinus issues.  She comes in today for a scheduled follow up.  No chest congestion currently.   No increased cough or sob.  She had an episode over the summer where she developed chest pain and some tension in her chest.  Used her inhaler and her symptoms resolved.  May need to use the inhaler 1-2x/month.   No significant acid reflux reported.  On Zantac.  Bowels stable.  Uses allover.  Has Flonase.  Not really watching what she eats.  Plans to get more serious about her diet.  Has joined a gym.  Plans to get more regular with exercise.   No urine change.  Bowels stable.  Taking the100mg  of the Losartan.  Tolerating.  Also taking the pravastatin now.  Tolerating.  Denies possibility of being pregnant.  Husband has had a vasectomy.  LMP 06/24/13.       Past Medical History  Diagnosis Date  . Hypertension   . Hypercholesterolemia   . GERD (gastroesophageal reflux disease)   . Environmental allergies     Current Outpatient Prescriptions on File Prior to Visit  Medication Sig Dispense Refill  . albuterol (PROVENTIL HFA;VENTOLIN HFA) 108 (90 BASE) MCG/ACT inhaler Inhale 2 puffs into the lungs every 6 (six) hours as needed for wheezing.  1 Inhaler  2  . amLODipine (NORVASC) 5 MG tablet TAKE ONE TABLET BY MOUTH TWICE DAILY  60 tablet  5  . fluticasone (FLONASE) 50 MCG/ACT nasal spray Place 2 sprays into the nose daily.  16 g  3  . glucose blood test strip 1 each by Other route 2 (two) times daily. Use as instructed, use one strip twice a day      . guaiFENesin (MUCINEX) 600 MG 12 hr tablet Take 1,200 mg by mouth 2 (two) times daily as needed.       . Lancets MISC by Does not apply route. Use 1 lancets three times a day      . losartan (COZAAR) 100 MG tablet TAKE ONE TABLET BY MOUTH  ONCE DAILY  30 tablet  5  . metFORMIN (GLUCOPHAGE) 500 MG tablet Take 1 tablet (500 mg total) by mouth 2 (two) times daily with a meal.  180 tablet  1  . metoprolol succinate (TOPROL-XL) 100 MG 24 hr tablet Take 50 mg by mouth twice daily  30 tablet  5  . pravastatin (PRAVACHOL) 10 MG tablet TAKE ONE TABLET BY MOUTH ONCE DAILY  30 tablet  5  . ranitidine (ZANTAC) 150 MG tablet Take 150 mg by mouth once. Take 1 tablet by mouth once a day       No current facility-administered medications on file prior to visit.    Review of Systems Patient denies any headache, lightheadedness or dizziness.  No sinus pressure or fever/chills.  No  tightness or palpitations.  No chest pain.   No increased shortness of breath, cough or congestion currently.  Some chest tightness and pain on a previous occasion.  Resolved with using her inhaler.  No nausea or vomiting.  No increased acid reflux.  No abdominal pain or cramping.  No bowel change, such as diarrhea, constipation, BRBPR or melana.  No urine change.  Not checking her sugars.  Taking her medication.  Taking the pravastatin regularly.  Objective:   Physical Exam  Filed Vitals:   07/03/13 0937  BP: 138/78  Pulse: 87  Temp: 97.8 F (36.6 C)   Blood pressure recheck:  132/78 pulse 41  37 year old female in no acute distress.   HEENT:  Nares- clear.  Oropharynx - without lesions. NECK:  Supple.  Nontender.  No audible bruit.  HEART:  Appears to be regular. LUNGS:  No crackles or wheezing audible.  Respirations even and unlabored.   RADIAL PULSE:  Equal bilaterally.  ABDOMEN:  Soft, nontender.  Bowel sounds present and normal.  No audible abdominal bruit.    EXTREMITIES:  No increased edema present.  DP pulses palpable and equal bilaterally.   FEET:  No lesions.           Assessment & Plan:  ALLERGIES.  Continue allavert.  Use Flonase daily.  Saline nasal flushes as directed.    PULMONARY. Breathing is doing well.  Follow.    GI.   Stable.  INCREASED PSYCHOSOCIAL STRESSORS.  Doing well.  Feels she is handling things relatively well.  Follow.   HEALTH MAINTENANCE.  Physical 11/03/12.   Pap at physical negative with negative HPV.

## 2013-07-05 ENCOUNTER — Encounter: Payer: Self-pay | Admitting: Internal Medicine

## 2013-07-05 NOTE — Assessment & Plan Note (Signed)
Platelet count slightly elevated.  Will recheck with next labs.

## 2013-07-05 NOTE — Assessment & Plan Note (Signed)
On metformin.  Brought in no recorded sugar readings.  Discussed watching her diet and need for exercise.   Discussed the need for weight loss.  Needs to check and record sugars.  Keep up to date with eye checks.  Follow.       

## 2013-07-05 NOTE — Assessment & Plan Note (Signed)
Intermittent chest tightness.  Improved with inhalers.  Discussed further w/up.  She declines further cardiac w/up.  Discussed stress testing.  She declines.  Did agree to cxr.  Further w/up pending.  Use Flonase and saline nasal spray as directed.  Continue allovert.  Follow.

## 2013-07-05 NOTE — Assessment & Plan Note (Signed)
No evidence of infection.  Recheck cbc with next labs.

## 2013-07-05 NOTE — Assessment & Plan Note (Signed)
Low cholesterol diet and exercise.  Taking pravastatin.  Tolerating.  Check lipid panel and liver function.    

## 2013-07-05 NOTE — Assessment & Plan Note (Signed)
On Losartan 100mg q day.  Follow metabolic panel.  Blood pressure better.   

## 2013-07-05 NOTE — Assessment & Plan Note (Signed)
Symptoms controlled.  On zantac.  Follow.    

## 2013-07-10 ENCOUNTER — Other Ambulatory Visit (INDEPENDENT_AMBULATORY_CARE_PROVIDER_SITE_OTHER): Payer: BC Managed Care – PPO

## 2013-07-10 DIAGNOSIS — D473 Essential (hemorrhagic) thrombocythemia: Secondary | ICD-10-CM

## 2013-07-10 DIAGNOSIS — E7800 Pure hypercholesterolemia, unspecified: Secondary | ICD-10-CM

## 2013-07-10 DIAGNOSIS — E119 Type 2 diabetes mellitus without complications: Secondary | ICD-10-CM

## 2013-07-10 LAB — CBC WITH DIFFERENTIAL/PLATELET
Basophils Absolute: 0 10*3/uL (ref 0.0–0.1)
Basophils Relative: 0.3 % (ref 0.0–3.0)
Eosinophils Absolute: 0.1 10*3/uL (ref 0.0–0.7)
Lymphocytes Relative: 24.4 % (ref 12.0–46.0)
MCHC: 32.9 g/dL (ref 30.0–36.0)
MCV: 80.5 fl (ref 78.0–100.0)
Monocytes Absolute: 0.6 10*3/uL (ref 0.1–1.0)
Monocytes Relative: 6.1 % (ref 3.0–12.0)
Neutrophils Relative %: 68.1 % (ref 43.0–77.0)
Platelets: 363 10*3/uL (ref 150.0–400.0)
RDW: 14.3 % (ref 11.5–14.6)

## 2013-07-10 LAB — LIPID PANEL
Cholesterol: 201 mg/dL — ABNORMAL HIGH (ref 0–200)
Total CHOL/HDL Ratio: 5
Triglycerides: 278 mg/dL — ABNORMAL HIGH (ref 0.0–149.0)
VLDL: 55.6 mg/dL — ABNORMAL HIGH (ref 0.0–40.0)

## 2013-07-10 LAB — BASIC METABOLIC PANEL
BUN: 12 mg/dL (ref 6–23)
Chloride: 102 mEq/L (ref 96–112)
GFR: 164.35 mL/min (ref >60.00)
Glucose, Bld: 119 mg/dL — ABNORMAL HIGH (ref 70–99)
Potassium: 4.5 mEq/L (ref 3.5–5.1)
Sodium: 136 mEq/L (ref 135–145)

## 2013-07-10 LAB — HEPATIC FUNCTION PANEL
ALT: 18 U/L (ref 0–35)
Albumin: 3.6 g/dL (ref 3.5–5.2)
Alkaline Phosphatase: 59 U/L (ref 39–117)
Bilirubin, Direct: 0.1 mg/dL (ref 0.0–0.3)
Total Protein: 7.2 g/dL (ref 6.0–8.3)

## 2013-07-12 ENCOUNTER — Telehealth: Payer: Self-pay | Admitting: Internal Medicine

## 2013-07-12 ENCOUNTER — Encounter: Payer: Self-pay | Admitting: Internal Medicine

## 2013-07-12 MED ORDER — PRAVASTATIN SODIUM 20 MG PO TABS: 20.0000 mg | ORAL_TABLET | Freq: Every day | ORAL | Status: DC

## 2013-07-12 NOTE — Telephone Encounter (Signed)
Pt notified of labs.  She needs a f/u appt with me in a few weeks.  Please schedule her for an appt on 08/10/13 at 11:45.  Please schedule her for the appt and contact her with an appt date and time.  Thanks.

## 2013-07-13 ENCOUNTER — Encounter: Payer: Self-pay | Admitting: Internal Medicine

## 2013-07-13 NOTE — Telephone Encounter (Signed)
Sent mychart message

## 2013-07-13 NOTE — Telephone Encounter (Signed)
Left message for pt to call office

## 2013-07-31 ENCOUNTER — Telehealth: Payer: Self-pay | Admitting: Internal Medicine

## 2013-07-31 ENCOUNTER — Other Ambulatory Visit: Payer: Self-pay | Admitting: Internal Medicine

## 2013-07-31 NOTE — Telephone Encounter (Signed)
Already done

## 2013-07-31 NOTE — Telephone Encounter (Signed)
Pt called to make sure you received her refill request from [facility] [place] for amlodipine Pt is completely Please advise when this is called in

## 2013-08-10 ENCOUNTER — Ambulatory Visit: Payer: BC Managed Care – PPO | Admitting: Internal Medicine

## 2013-08-24 ENCOUNTER — Encounter: Payer: Self-pay | Admitting: Internal Medicine

## 2013-08-24 ENCOUNTER — Ambulatory Visit: Payer: BC Managed Care – PPO | Admitting: Internal Medicine

## 2013-08-24 ENCOUNTER — Ambulatory Visit (INDEPENDENT_AMBULATORY_CARE_PROVIDER_SITE_OTHER): Payer: BC Managed Care – PPO | Admitting: Internal Medicine

## 2013-08-24 VITALS — BP 130/90 | HR 88 | Temp 98.2°F | Ht 67.5 in | Wt 301.0 lb

## 2013-08-24 DIAGNOSIS — I10 Essential (primary) hypertension: Secondary | ICD-10-CM

## 2013-08-24 DIAGNOSIS — E7800 Pure hypercholesterolemia, unspecified: Secondary | ICD-10-CM

## 2013-08-24 DIAGNOSIS — K219 Gastro-esophageal reflux disease without esophagitis: Secondary | ICD-10-CM

## 2013-08-24 DIAGNOSIS — D72829 Elevated white blood cell count, unspecified: Secondary | ICD-10-CM

## 2013-08-24 DIAGNOSIS — R0789 Other chest pain: Secondary | ICD-10-CM

## 2013-08-24 DIAGNOSIS — D75839 Thrombocytosis, unspecified: Secondary | ICD-10-CM

## 2013-08-24 DIAGNOSIS — D473 Essential (hemorrhagic) thrombocythemia: Secondary | ICD-10-CM

## 2013-08-24 DIAGNOSIS — E119 Type 2 diabetes mellitus without complications: Secondary | ICD-10-CM

## 2013-08-24 NOTE — Assessment & Plan Note (Signed)
Last platelet count wnl.   

## 2013-08-24 NOTE — Assessment & Plan Note (Signed)
On Losartan 100mg  q day.  Follow metabolic panel.  Blood pressure better.

## 2013-08-24 NOTE — Assessment & Plan Note (Signed)
Low cholesterol diet and exercise.  Taking pravastatin.  Tolerating.  Check lipid panel and liver function.

## 2013-08-24 NOTE — Assessment & Plan Note (Signed)
Last white count wnl.

## 2013-08-24 NOTE — Assessment & Plan Note (Signed)
Not reported as an issue today.  

## 2013-08-24 NOTE — Assessment & Plan Note (Signed)
On metformin.  Brought in no recorded sugar readings.  Discussed watching her diet and need for exercise.   Discussed the need for weight loss.  Needs to check and record sugars.  Keep up to date with eye checks.  Follow.

## 2013-08-24 NOTE — Progress Notes (Signed)
Subjective:    Patient ID: Lindsey Strickland, female    DOB: 1976-01-21, 38 y.o.   MRN: [id number]  HPI 38 year old female with past history of hypertension, hypercholesterolemia, GERD, diabetes and reoccurring allergy and sinus issues.  She comes in today for a scheduled follow up.  No cough or sob.   No significant acid reflux reported.  On Zantac.  Bowels stable.  Has Flonase.  Not really watching what she eats.  Plans to get more serious about her diet.  Has joined a gym.  Not going.  Plans to get more regular with exercise.   No urine change.  Bowels stable.  Taking the100mg  of the Losartan.  Tolerating.  Also taking the pravastatin now.  Tolerating.  Not checking her sugars.  Brought in no recorded sugar readings.       Past Medical History  Diagnosis Date  . Hypertension   . Hypercholesterolemia   . GERD (gastroesophageal reflux disease)   . Environmental allergies     Current Outpatient Prescriptions on File Prior to Visit  Medication Sig Dispense Refill  . albuterol (PROVENTIL HFA;VENTOLIN HFA) 108 (90 BASE) MCG/ACT inhaler Inhale 2 puffs into the lungs every 6 (six) hours as needed for wheezing.  1 Inhaler  2  . amLODipine (NORVASC) 5 MG tablet TAKE ONE TABLET BY MOUTH TWICE DAILY  60 tablet  5  . fluticasone (FLONASE) 50 MCG/ACT nasal spray Place 2 sprays into the nose daily.  16 g  3  . glucose blood test strip 1 each by Other route 2 (two) times daily. Use as instructed, use one strip twice a day      . guaiFENesin (MUCINEX) 600 MG 12 hr tablet Take 1,200 mg by mouth 2 (two) times daily as needed.       . Lancets MISC by Does not apply route. Use 1 lancets three times a day      . metFORMIN (GLUCOPHAGE) 500 MG tablet Take 1 tablet (500 mg total) by mouth 2 (two) times daily with a meal.  180 tablet  1  . metoprolol succinate (TOPROL-XL) 100 MG 24 hr tablet Take 50 mg by mouth twice daily  30 tablet  5  . pravastatin (PRAVACHOL) 20 MG tablet Take 1 tablet (20 mg total) by mouth  daily.  30 tablet  3  . Probiotic Product (ALIGN PO) Take by mouth daily.      . ranitidine (ZANTAC) 150 MG tablet Take 150 mg by mouth once. Take 1 tablet by mouth once a day      . losartan (COZAAR) 100 MG tablet TAKE ONE TABLET BY MOUTH ONCE DAILY  30 tablet  5   No current facility-administered medications on file prior to visit.    Review of Systems Patient denies any headache, lightheadedness or dizziness.  No sinus pressure or fever/chills.  No  tightness or palpitations.  No chest pain.   No increased shortness of breath, cough or congestion.   No nausea or vomiting.  No increased acid reflux.  No abdominal pain or cramping.  No bowel change, such as diarrhea, constipation, BRBPR or melana.  No urine change.  Not checking her sugars.  Taking her medication.  Taking the pravastatin regularly.        Objective:   Physical Exam  Filed Vitals:   08/24/13 1021  BP: 130/90  Pulse: 88  Temp: 98.2 F (36.8 C)   Blood pressure recheck:  132/84, pulse 14  38 year old female in  no acute distress.   HEENT:  Nares- clear.  Oropharynx - without lesions. NECK:  Supple.  Nontender.  No audible bruit.  HEART:  Appears to be regular. LUNGS:  No crackles or wheezing audible.  Respirations even and unlabored.   RADIAL PULSE:  Equal bilaterally.  ABDOMEN:  Soft, nontender.  Bowel sounds present and normal.  No audible abdominal bruit.    EXTREMITIES:  No increased edema present.  DP pulses palpable and equal bilaterally.   FEET:  No lesions.           Assessment & Plan:  ALLERGIES.  Continue allavert.  Use Flonase daily.  Saline nasal flushes as directed.    PULMONARY. Breathing is doing well.  Follow.    GI.  Stable.  INCREASED PSYCHOSOCIAL STRESSORS.  Doing well.  Feels she is handling things relatively well.  Follow.   HEALTH MAINTENANCE.  Physical 11/03/12.   Pap at physical negative with negative HPV.

## 2013-08-24 NOTE — Assessment & Plan Note (Signed)
Symptoms controlled.  On zantac.  Follow.    

## 2013-09-02 ENCOUNTER — Telehealth: Payer: Self-pay | Admitting: Internal Medicine

## 2013-09-02 ENCOUNTER — Ambulatory Visit: Payer: BC Managed Care – PPO | Admitting: Family Medicine

## 2013-09-02 ENCOUNTER — Encounter: Payer: Self-pay | Admitting: Internal Medicine

## 2013-09-02 ENCOUNTER — Ambulatory Visit (INDEPENDENT_AMBULATORY_CARE_PROVIDER_SITE_OTHER): Payer: BC Managed Care – PPO | Admitting: Internal Medicine

## 2013-09-02 VITALS — BP 144/80 | HR 112 | Temp 98.4°F | Wt 297.5 lb

## 2013-09-02 DIAGNOSIS — N3090 Cystitis, unspecified without hematuria: Secondary | ICD-10-CM

## 2013-09-02 DIAGNOSIS — R3915 Urgency of urination: Secondary | ICD-10-CM

## 2013-09-02 DIAGNOSIS — J069 Acute upper respiratory infection, unspecified: Secondary | ICD-10-CM

## 2013-09-02 LAB — POCT URINALYSIS DIPSTICK
Bilirubin, UA: NEGATIVE
GLUCOSE UA: NEGATIVE
LEUKOCYTES UA: NEGATIVE
NITRITE UA: NEGATIVE
SPEC GRAV UA: 1.025
UROBILINOGEN UA: NEGATIVE
pH, UA: 6.5

## 2013-09-02 MED ORDER — AZITHROMYCIN 250 MG PO TABS: ORAL_TABLET | ORAL | Status: DC

## 2013-09-02 NOTE — Progress Notes (Signed)
Subjective:    Patient ID: Lindsey Strickland, female    DOB: 07-30-1976, 38 y.o.   MRN: [id number]  HPI  Pt presents to the clinic today with c/o sore throat nasal congestion and cough. This started 4 days ago. She denies difficulty or pain with swallowing. The cough is unproductive. She feels like she has been running fevers but has not actually taken her temperature. She has been taking OTC Mucinex without any relief. She has also been using an albuterol inhaler. She has a history of allergies but not asthma. She is concerned that she has pneumonia. Additionally, she c/o urgency. This started 3 days ago. She denies abdominal pain or flank pain. She denies vaginal complaints such as discharge or odor.  Review of Systems      Past Medical History  Diagnosis Date  . Hypertension   . Hypercholesterolemia   . GERD (gastroesophageal reflux disease)   . Environmental allergies     Current Outpatient Prescriptions  Medication Sig Dispense Refill  . albuterol (PROVENTIL HFA;VENTOLIN HFA) 108 (90 BASE) MCG/ACT inhaler Inhale 2 puffs into the lungs every 6 (six) hours as needed for wheezing.  1 Inhaler  2  . amLODipine (NORVASC) 5 MG tablet TAKE ONE TABLET BY MOUTH TWICE DAILY  60 tablet  5  . fluticasone (FLONASE) 50 MCG/ACT nasal spray Place 2 sprays into the nose daily.  16 g  3  . glucose blood test strip 1 each by Other route 2 (two) times daily. Use as instructed, use one strip twice a day      . guaiFENesin (MUCINEX) 600 MG 12 hr tablet Take 1,200 mg by mouth 2 (two) times daily as needed.       . Lancets MISC by Does not apply route. Use 1 lancets three times a day      . losartan (COZAAR) 100 MG tablet TAKE ONE TABLET BY MOUTH ONCE DAILY  30 tablet  5  . metFORMIN (GLUCOPHAGE) 500 MG tablet Take 1 tablet (500 mg total) by mouth 2 (two) times daily with a meal.  180 tablet  1  . metoprolol succinate (TOPROL-XL) 100 MG 24 hr tablet Take 50 mg by mouth twice daily  30 tablet  5  .  pravastatin (PRAVACHOL) 20 MG tablet Take 1 tablet (20 mg total) by mouth daily.  30 tablet  3  . Probiotic Product (ALIGN PO) Take by mouth daily.      . ranitidine (ZANTAC) 150 MG tablet Take 150 mg by mouth once. Take 1 tablet by mouth once a day       No current facility-administered medications for this visit.    Allergies  Allergen Reactions  . Augmentin [Amoxicillin-Pot Clavulanate]     GI intolerance   . Hctz [Hydrochlorothiazide]   . Other     Mycins    Family History  Problem Relation Age of Onset  . Hypertension Mother   . Diabetes Mother   . Diverticulosis Mother   . Hypercholesterolemia Father   . Hypertension Father   . Heart disease Maternal Grandfather   . Diverticulosis Paternal Grandmother   . Hyperlipidemia Paternal Grandmother   . Heart failure Paternal Grandmother     History   Social History  . Marital Status: Single    Spouse Name: N/A    Number of Children: 2  . Years of Education: N/A   Occupational History  .     Social History Main Topics  . Smoking status: Former Research scientist (life sciences)  .  Smokeless tobacco: Never Used  . Alcohol Use: Yes     Comment: rare  . Drug Use: No  . Sexual Activity: Not on file   Other Topics Concern  . Not on file   Social History Narrative  . No narrative on file     Constitutional: Denies fever, malaise, fatigue, headache or abrupt weight changes.  HEENT: Pt reports nasal congestion, sore throat. Denies eye pain, eye redness, ear pain, ringing in the ears, wax buildup, runny nose, bloody nose. Respiratory: Pt reports cough. Denies difficulty breathing, shortness of breath, or sputum production.   Cardiovascular: Denies chest pain, chest tightness, palpitations or swelling in the hands or feet.   GU: Pt reports urgency. Denies  frequency, pain with urination, burning sensation, blood in urine, odor or discharge.   No other specific complaints in a complete review of systems (except as listed in HPI  above).  Objective:   Physical Exam   BP 144/80  Pulse 112  Temp(Src) 98.4 F (36.9 C) (Oral)  Wt 297 lb 8 oz (134.945 kg)  SpO2 98% Wt Readings from Last 3 Encounters:  09/02/13 297 lb 8 oz (134.945 kg)  08/24/13 301 lb (136.533 kg)  07/03/13 304 lb 8 oz (138.12 kg)    General: Appears her stated age, well developed, well nourished in NAD. HEENT: Head: normal shape and size; Eyes: sclera white, no icterus, conjunctiva pink, PERRLA and EOMs intact; Ears: Tm's gray and intact, normal light reflex; Nose: mucosa pink and moist, septum midline; Throat/Mouth: Teeth present, mucosa pink and moist, no exudate, lesions or ulcerations noted.  Neck: Normal range of motion. Neck supple, trachea midline. No massses, lumps or thyromegaly present.  Cardiovascular: Normal rate and rhythm. S1,S2 noted.  No murmur, rubs or gallops noted. No JVD or BLE edema. No carotid bruits noted. Pulmonary/Chest: Normal effort and positive vesicular breath sounds. No respiratory distress. No wheezes, rales or ronchi noted.  Abdomen: Soft and nontender. Normal bowel sounds, no bruits noted. No distention or masses noted. Liver, spleen and kidneys non palpable. No CVA tenderness.   BMET    Component Value Date/Time   NA 136 07/10/2013 0816   K 4.5 07/10/2013 0816   CL 102 07/10/2013 0816   CO2 25 07/10/2013 0816   GLUCOSE 119* 07/10/2013 0816   BUN 12 07/10/2013 0816   CREATININE 0.5 07/10/2013 0816   CALCIUM 9.2 07/10/2013 0816    Lipid Panel     Component Value Date/Time   CHOL 201* 07/10/2013 0816   TRIG 278.0* 07/10/2013 0816   HDL 39.90 07/10/2013 0816   CHOLHDL 5 07/10/2013 0816   VLDL 55.6* 07/10/2013 0816    CBC    Component Value Date/Time   WBC 9.4 07/10/2013 0816   RBC 4.60 07/10/2013 0816   HGB 12.2 07/10/2013 0816   HCT 37.0 07/10/2013 0816   PLT 363.0 07/10/2013 0816   MCV 80.5 07/10/2013 0816   MCHC 32.9 07/10/2013 0816   RDW 14.3 07/10/2013 0816   LYMPHSABS 2.3 07/10/2013 0816   MONOABS 0.6  07/10/2013 0816   EOSABS 0.1 07/10/2013 0816   BASOSABS 0.0 07/10/2013 0816    Hgb A1C Lab Results  Component Value Date   HGBA1C 7.1* 07/10/2013        Assessment & Plan:   Acute URI:  ? Viral at this point Discussed use of supportive measures: rest, ibuprofen, fluids If no improvement by Friday go ahead and start the zpack OK to use OTC cough suppressant if needed  Urgency secondary to cystitis:  Urinalysis: small blood, trace ketones Increase your fluid intake Can take Cystex OTC

## 2013-09-02 NOTE — Telephone Encounter (Signed)
Patient Information:  Caller Name: Aren  Phone: [phone]  Patient: Lindsey Strickland, Lindsey Strickland  Gender: Female  DOB: July 21, 1976  Age: 38 Years  PCP: Einar Pheasant  Pregnant: No  Office Follow Up:  Does the office need to follow up with this patient?: No  Instructions For The Office: N/A  RN Note:  Albuterol used once 08/31/13 and 09/01/13. Instructed to begin using it every 4 hours for chest tightness, mild shortness of breath and dry cough.   Symptoms  Reason For Call & Symptoms: Chest congestion with severe sore throat. Concerned about possible pneumonia. Did not test FBS 1/27 or 09/02/13.  Reviewed Health History In EMR: Yes  Reviewed Medications In EMR: Yes  Reviewed Allergies In EMR: Yes  Reviewed Surgeries / Procedures: Yes  Date of Onset of Symptoms: 08/30/2013  Treatments Tried: Mucinex, Flonase, Albuterol MDI  Treatments Tried Worked: No  Any Fever: Yes  Fever Taken: Oral  Fever Time Of Reading: 00:00:00  Fever Last Reading: 100.0 OB / GYN:  LMP: 08/21/2013  Guideline(s) Used:  Asthma Attack  Disposition Per Guideline:   See Today in Office  Reason For Disposition Reached:   Patient wants to be seen  Advice Given:  Quick-Relief Asthma Medicine:   Start your quick-relief medicine (e.g., albuterol, salbutamol) at the first sign of any coughing or shortness of breath (don't wait for wheezing). Use your inhaler (2 puffs each time) or nebulizer every 4 hours. Continue the quick-relief medicine until you have not wheezed or coughed for 48 hours.  The best "cough medicine" for an adult with asthma is always the asthma medicine (Note: Don't use cough suppressants, but cough drops may help a tickly cough).  Drinking Liquids:  Try to drink normal amount of liquids (e.g., water). Being adequately hydrated makes it easier to cough up the sticky lung mucus.  Humidifier:   If the air is dry, use a cool mist humidifier to prevent drying of the upper airway.  Expected Course:  If  treatment is started early, most asthma attacks are quickly brought under control. All wheezing should be gone by 5 days.  Call Back If:  Inhaled asthma medicine (nebulizer or inhaler) is needed more often than every 4 hours  You become worse.  RN Overrode Recommendation:  Patient Already Has Appt, Document Patient  Already scheduld for 1530 09/02/13 with Dr Glori Bickers at [facility]

## 2013-09-02 NOTE — Progress Notes (Signed)
HPI: Pt presents to the office today wit concerns of sore throat and congestion. Symptoms started four days ago. She endorses cough, congestion, nasal congestion/discharge, and fatigue. She denies difficulty swallowing, shortness of breath, fever, or chills. She has tried OTC Mucinex, which did not help. She also endorses concern for UTI. She endorses urgency and frequency without blood, pain, or burning. She has not tried anything and symptoms just started this am.   Past Medical History  Diagnosis Date  . Hypertension   . Hypercholesterolemia   . GERD (gastroesophageal reflux disease)   . Environmental allergies     Current Outpatient Prescriptions  Medication Sig Dispense Refill  . albuterol (PROVENTIL HFA;VENTOLIN HFA) 108 (90 BASE) MCG/ACT inhaler Inhale 2 puffs into the lungs every 6 (six) hours as needed for wheezing.  1 Inhaler  2  . amLODipine (NORVASC) 5 MG tablet TAKE ONE TABLET BY MOUTH TWICE DAILY  60 tablet  5  . fluticasone (FLONASE) 50 MCG/ACT nasal spray Place 2 sprays into the nose daily.  16 g  3  . glucose blood test strip 1 each by Other route 2 (two) times daily. Use as instructed, use one strip twice a day      . guaiFENesin (MUCINEX) 600 MG 12 hr tablet Take 1,200 mg by mouth 2 (two) times daily as needed.       . Lancets MISC by Does not apply route. Use 1 lancets three times a day      . losartan (COZAAR) 100 MG tablet TAKE ONE TABLET BY MOUTH ONCE DAILY  30 tablet  5  . metFORMIN (GLUCOPHAGE) 500 MG tablet Take 1 tablet (500 mg total) by mouth 2 (two) times daily with a meal.  180 tablet  1  . metoprolol succinate (TOPROL-XL) 100 MG 24 hr tablet Take 50 mg by mouth twice daily  30 tablet  5  . pravastatin (PRAVACHOL) 20 MG tablet Take 1 tablet (20 mg total) by mouth daily.  30 tablet  3  . Probiotic Product (ALIGN PO) Take by mouth daily.      . ranitidine (ZANTAC) 150 MG tablet Take 150 mg by mouth once. Take 1 tablet by mouth once a day       No current  facility-administered medications for this visit.    Allergies  Allergen Reactions  . Augmentin [Amoxicillin-Pot Clavulanate]     GI intolerance   . Hctz [Hydrochlorothiazide]   . Other     Mycins    Family History  Problem Relation Age of Onset  . Hypertension Mother   . Diabetes Mother   . Diverticulosis Mother   . Hypercholesterolemia Father   . Hypertension Father   . Heart disease Maternal Grandfather   . Diverticulosis Paternal Grandmother   . Hyperlipidemia Paternal Grandmother   . Heart failure Paternal Grandmother     History   Social History  . Marital Status: Single    Spouse Name: N/A    Number of Children: 2  . Years of Education: N/A   Occupational History  .     Social History Main Topics  . Smoking status: Former Research scientist (life sciences)  . Smokeless tobacco: Never Used  . Alcohol Use: Yes     Comment: rare  . Drug Use: No  . Sexual Activity: Not on file   Other Topics Concern  . Not on file   Social History Narrative  . No narrative on file    ROS:  Constitutional: Denies fever, malaise, fatigue, headache or  abrupt weight changes.  HEENT:Endorses sore throat, nasal congestion, and nasal discharge.  Denies eye pain, eye redness, ear pain, ringing in the ears, wax buildup, Respiratory:Endorses cough with sputum production.  Denies difficulty breathing, shortness of breath   Cardiovascular: Denies chest pain, chest tightness, palpitations or swelling in the hands or feet.  .   No other specific complaints in a complete review of systems (except as listed in HPI above).  PE:  Wt 297 lb 8 oz (134.945 kg) Wt Readings from Last 3 Encounters:  09/02/13 297 lb 8 oz (134.945 kg)  08/24/13 301 lb (136.533 kg)  07/03/13 304 lb 8 oz (138.12 kg)    General: Appears their stated age, well developed, well nourished in NAD. HEENT: Head: normal shape and size; Eyes: sclera white, no icterus, conjunctiva pink, PERRLA and EOMs intact; Ears: Tm's gray and intact,  normal light reflex; Nose: mucosa pink and moist, septum midline; Throat/Mouth: Teeth present, mucosa pink and moist, no lesions or ulcerations noted.  Neck: Normal range of motion. Neck supple, trachea midline. No massses, lumps or thyromegaly present.  Cardiovascular: Normal rate and rhythm. S1,S2 noted.  No murmur, rubs or gallops noted. No JVD or BLE edema. No carotid bruits noted. Pulmonary/Chest: Normal effort and positive vesicular breath sounds. No respiratory distress. No wheezes, rales or ronchi noted.  Psychiatric: Mood and affect normal. Behavior is normal. Judgment and thought content normal.    Assessment and Plan: URI Probably viral, will treat with Rx for Zpack for 5 days per patient request Supportive care, rest and fluids Follow up if no improvements in 3-5 days  Urinary urgency: Urinalysis clear, did have ketones, spoke with patient about checking her sugars, probably from possible infection Increase fluids Recommended OTC AZO or cranberry tablets  Jermany Sundell S, Student-NP

## 2013-09-02 NOTE — Addendum Note (Signed)
Addended by: Lurlean Nanny on: 09/02/2013 02:01 PM   Modules accepted: Orders

## 2013-09-02 NOTE — Progress Notes (Signed)
Pre-visit discussion using our clinic review tool. No additional management support is needed unless otherwise documented below in the visit note.  

## 2013-09-02 NOTE — Telephone Encounter (Signed)
FYI

## 2013-09-02 NOTE — Patient Instructions (Signed)

## 2013-09-10 ENCOUNTER — Encounter: Payer: Self-pay | Admitting: Internal Medicine

## 2013-09-14 ENCOUNTER — Other Ambulatory Visit: Payer: Self-pay | Admitting: Internal Medicine

## 2013-09-26 ENCOUNTER — Other Ambulatory Visit: Payer: Self-pay | Admitting: Internal Medicine

## 2013-09-26 ENCOUNTER — Encounter: Payer: Self-pay | Admitting: Internal Medicine

## 2013-10-28 ENCOUNTER — Other Ambulatory Visit (INDEPENDENT_AMBULATORY_CARE_PROVIDER_SITE_OTHER): Payer: BC Managed Care – PPO

## 2013-10-28 DIAGNOSIS — I10 Essential (primary) hypertension: Secondary | ICD-10-CM

## 2013-10-28 DIAGNOSIS — E7800 Pure hypercholesterolemia, unspecified: Secondary | ICD-10-CM

## 2013-10-28 DIAGNOSIS — E119 Type 2 diabetes mellitus without complications: Secondary | ICD-10-CM

## 2013-10-28 LAB — BASIC METABOLIC PANEL
BUN: 13 mg/dL (ref 6–23)
CHLORIDE: 103 meq/L (ref 96–112)
CO2: 24 mEq/L (ref 19–32)
CREATININE: 0.5 mg/dL (ref 0.4–1.2)
Calcium: 9.4 mg/dL (ref 8.4–10.5)
GFR: 156.2 mL/min (ref >60.00)
Glucose, Bld: 135 mg/dL — ABNORMAL HIGH (ref 70–99)
Potassium: 4.6 mEq/L (ref 3.5–5.1)
Sodium: 138 mEq/L (ref 135–145)

## 2013-10-28 LAB — TSH: TSH: 3.99 u[IU]/mL (ref 0.35–5.50)

## 2013-10-28 LAB — LIPID PANEL
Cholesterol: 206 mg/dL — ABNORMAL HIGH (ref 0–200)
HDL: 40.7 mg/dL (ref >39.00)
LDL CALC: 114 mg/dL — AB (ref 0–99)
Total CHOL/HDL Ratio: 5
Triglycerides: 255 mg/dL — ABNORMAL HIGH (ref 0.0–149.0)
VLDL: 51 mg/dL — ABNORMAL HIGH (ref 0.0–40.0)

## 2013-10-28 LAB — HEPATIC FUNCTION PANEL
ALK PHOS: 64 U/L (ref 39–117)
ALT: 18 U/L (ref 0–35)
AST: 15 U/L (ref 0–37)
Albumin: 3.9 g/dL (ref 3.5–5.2)
BILIRUBIN DIRECT: 0.1 mg/dL (ref 0.0–0.3)
TOTAL PROTEIN: 7.4 g/dL (ref 6.0–8.3)
Total Bilirubin: 0.4 mg/dL (ref 0.3–1.2)

## 2013-10-28 LAB — HEMOGLOBIN A1C: Hgb A1c MFr Bld: 7.1 % — ABNORMAL HIGH (ref 4.6–6.5)

## 2013-10-29 ENCOUNTER — Encounter: Payer: Self-pay | Admitting: Internal Medicine

## 2013-10-30 ENCOUNTER — Encounter: Payer: Self-pay | Admitting: Internal Medicine

## 2013-10-30 ENCOUNTER — Ambulatory Visit (INDEPENDENT_AMBULATORY_CARE_PROVIDER_SITE_OTHER): Payer: BC Managed Care – PPO | Admitting: Internal Medicine

## 2013-10-30 VITALS — BP 150/80 | HR 105 | Temp 98.3°F | Ht 67.0 in | Wt 303.5 lb

## 2013-10-30 DIAGNOSIS — E7800 Pure hypercholesterolemia, unspecified: Secondary | ICD-10-CM

## 2013-10-30 DIAGNOSIS — E119 Type 2 diabetes mellitus without complications: Secondary | ICD-10-CM

## 2013-10-30 DIAGNOSIS — K219 Gastro-esophageal reflux disease without esophagitis: Secondary | ICD-10-CM

## 2013-10-30 DIAGNOSIS — I10 Essential (primary) hypertension: Secondary | ICD-10-CM

## 2013-10-30 DIAGNOSIS — D473 Essential (hemorrhagic) thrombocythemia: Secondary | ICD-10-CM

## 2013-10-30 DIAGNOSIS — R82998 Other abnormal findings in urine: Secondary | ICD-10-CM

## 2013-10-30 DIAGNOSIS — D75839 Thrombocytosis, unspecified: Secondary | ICD-10-CM

## 2013-10-30 DIAGNOSIS — R8290 Unspecified abnormal findings in urine: Secondary | ICD-10-CM

## 2013-10-30 LAB — URINALYSIS, ROUTINE W REFLEX MICROSCOPIC
BILIRUBIN URINE: NEGATIVE
Ketones, ur: NEGATIVE
Leukocytes, UA: NEGATIVE
Nitrite: NEGATIVE
PH: 6 (ref 5.0–8.0)
SPECIFIC GRAVITY, URINE: 1.025 (ref 1.000–1.030)
TOTAL PROTEIN, URINE-UPE24: NEGATIVE
Urine Glucose: NEGATIVE
Urobilinogen, UA: 0.2 (ref 0.0–1.0)

## 2013-10-30 LAB — MICROALBUMIN / CREATININE URINE RATIO
Creatinine,U: 82.5 mg/dL
Microalb Creat Ratio: 3 mg/g (ref 0.0–30.0)
Microalb, Ur: 2.5 mg/dL — ABNORMAL HIGH (ref 0.0–1.9)

## 2013-10-30 MED ORDER — METFORMIN HCL 1000 MG PO TABS: 1000.0000 mg | ORAL_TABLET | Freq: Two times a day (BID) | ORAL | Status: DC

## 2013-10-30 NOTE — Progress Notes (Signed)
Pre-visit discussion using our clinic review tool. No additional management support is needed unless otherwise documented below in the visit note.  

## 2013-10-30 NOTE — Telephone Encounter (Signed)
Noted  

## 2013-10-31 ENCOUNTER — Encounter: Payer: Self-pay | Admitting: Internal Medicine

## 2013-11-01 ENCOUNTER — Encounter: Payer: Self-pay | Admitting: Internal Medicine

## 2013-11-01 DIAGNOSIS — R8290 Unspecified abnormal findings in urine: Secondary | ICD-10-CM | POA: Insufficient documentation

## 2013-11-01 NOTE — Assessment & Plan Note (Signed)
On metformin.  Brought in no recorded sugar readings.  Discussed watching her diet and need for exercise.   Discussed the need for weight loss.  Needs to check and record sugars regularly.  Keep up to date with eye checks.  Will increase metformin to 1000mg  bid.   Follow.

## 2013-11-01 NOTE — Assessment & Plan Note (Signed)
On Losartan 100mg  q day.  Follow metabolic panel.  Blood pressure as outlined.  Follow.  Hold on adjusting medication.

## 2013-11-01 NOTE — Assessment & Plan Note (Signed)
Check urinalysis and urine culture to confirm no infection.    

## 2013-11-01 NOTE — Assessment & Plan Note (Signed)
Low cholesterol diet and exercise.  Taking pravastatin.  Tolerating.  Follow lipid panel and liver function.    

## 2013-11-01 NOTE — Progress Notes (Signed)
Subjective:    Patient ID: Lindsey Strickland, female    DOB: [birth year], 38 y.o.   MRN: [id number]  HPI 38 year old female with past history of hypertension, hypercholesterolemia, GERD, diabetes and reoccurring allergy and sinus issues.  She comes in today to follow up on these issues as well as for a complete physical exam.  No cough or sob.   No significant acid reflux reported.  On Zantac.  Bowels stable.  Has Flonase.  Not really watching what she eats.  Plans to get more serious about her diet.  Plans to get more regular with exercise.   Bowels stable.  Taking the100mg  of the Losartan.  Tolerating.  Also taking the pravastatin now.  Tolerating.  Not checking her sugars regularly.  Brought in no recorded sugar readings.  States am sugars are averaging 97-130-140.  Cholesterol improved.  Increased stress with her father's health issues.       Past Medical History  Diagnosis Date  . Hypertension   . Hypercholesterolemia   . GERD (gastroesophageal reflux disease)   . Environmental allergies     Current Outpatient Prescriptions on File Prior to Visit  Medication Sig Dispense Refill  . albuterol (PROVENTIL HFA;VENTOLIN HFA) 108 (90 BASE) MCG/ACT inhaler Inhale 2 puffs into the lungs every 6 (six) hours as needed for wheezing.  1 Inhaler  2  . amLODipine (NORVASC) 5 MG tablet TAKE ONE TABLET BY MOUTH TWICE DAILY  60 tablet  5  . fluticasone (FLONASE) 50 MCG/ACT nasal spray Place 2 sprays into the nose daily.  16 g  3  . glucose blood test strip 1 each by Other route 2 (two) times daily. Use as instructed, use one strip twice a day      . Lancets MISC by Does not apply route. Use 1 lancets three times a day      . losartan (COZAAR) 100 MG tablet TAKE ONE TABLET BY MOUTH ONCE DAILY  30 tablet  5  . metoprolol succinate (TOPROL-XL) 100 MG 24 hr tablet Take 50 mg by mouth twice daily  30 tablet  5  . pravastatin (PRAVACHOL) 20 MG tablet Take 1 tablet (20 mg total) by mouth daily.  30 tablet  3  .  Probiotic Product (ALIGN PO) Take by mouth daily.      . ranitidine (ZANTAC) 150 MG tablet Take 150 mg by mouth once. Take 1 tablet by mouth once a day      . guaiFENesin (MUCINEX) 600 MG 12 hr tablet Take 1,200 mg by mouth 2 (two) times daily as needed.        No current facility-administered medications on file prior to visit.    Review of Systems Patient denies any headache, lightheadedness or dizziness.  No sinus pressure or fever/chills.  No  tightness or palpitations.  No chest pain.   No increased shortness of breath, cough or congestion.   No nausea or vomiting.  No increased acid reflux.  No abdominal pain or cramping.  No bowel change, such as diarrhea, constipation, BRBPR or melana.  Some urinary odor.  Wants urine checked for infection.   Not checking her sugars regularly.  Taking her medication.  Taking the pravastatin regularly.        Objective:   Physical Exam  Filed Vitals:   10/30/13 1335  BP: 150/80  Pulse: 105  Temp: 98.3 F (36.8 C)   Blood pressure recheck:  138-67/71-34  38 year old female in no acute distress.  HEENT:  Nares- clear.  Oropharynx - without lesions. NECK:  Supple.  Nontender.  No audible bruit.  HEART:  Appears to be regular. LUNGS:  No crackles or wheezing audible.  Respirations even and unlabored.  RADIAL PULSE:  Equal bilaterally.    BREASTS:  No nipple discharge or nipple retraction present.  Could not appreciate any distinct nodules or axillary adenopathy.  ABDOMEN:  Soft, nontender.  Bowel sounds present and normal.  No audible abdominal bruit.  GU:  Not performed.     EXTREMITIES:  No increased edema present.  DP pulses palpable and equal bilaterally.      FEET:  No lesions.        Assessment & Plan:  ALLERGIES.  Continue allavert.  Use Flonase daily.  Saline nasal flushes as directed.    PULMONARY. Breathing is doing well.  Follow.    GI.  Stable.  INCREASED PSYCHOSOCIAL STRESSORS.  Doing well.  Feels she is handling things  relatively well.  Follow.   HEALTH MAINTENANCE.  Physical today.   Pap 11/03/12 negative with negative HPV.

## 2013-11-01 NOTE — Assessment & Plan Note (Signed)
Most recent cbc revealed normal white blood cell count and platelet count.

## 2013-11-01 NOTE — Assessment & Plan Note (Signed)
Symptoms controlled.  On zantac.  Follow.    

## 2013-11-02 LAB — CULTURE, URINE COMPREHENSIVE

## 2013-11-03 ENCOUNTER — Other Ambulatory Visit: Payer: Self-pay | Admitting: *Deleted

## 2013-11-03 MED ORDER — CIPROFLOXACIN HCL 500 MG PO TABS: 500.0000 mg | ORAL_TABLET | Freq: Two times a day (BID) | ORAL | Status: DC

## 2013-11-17 ENCOUNTER — Other Ambulatory Visit: Payer: Self-pay | Admitting: Internal Medicine

## 2013-11-18 ENCOUNTER — Other Ambulatory Visit: Payer: Self-pay | Admitting: *Deleted

## 2013-11-18 MED ORDER — PRAVASTATIN SODIUM 20 MG PO TABS: 20.0000 mg | ORAL_TABLET | Freq: Every day | ORAL | Status: DC

## 2014-01-26 ENCOUNTER — Other Ambulatory Visit: Payer: Self-pay | Admitting: Internal Medicine

## 2014-01-27 ENCOUNTER — Encounter: Payer: Self-pay | Admitting: Internal Medicine

## 2014-01-27 ENCOUNTER — Ambulatory Visit (INDEPENDENT_AMBULATORY_CARE_PROVIDER_SITE_OTHER): Payer: BC Managed Care – PPO | Admitting: Internal Medicine

## 2014-01-27 VITALS — BP 124/64 | HR 88 | Temp 98.3°F | Ht 67.0 in | Wt 305.2 lb

## 2014-01-27 DIAGNOSIS — E119 Type 2 diabetes mellitus without complications: Secondary | ICD-10-CM

## 2014-01-27 DIAGNOSIS — E7800 Pure hypercholesterolemia, unspecified: Secondary | ICD-10-CM

## 2014-01-27 DIAGNOSIS — I10 Essential (primary) hypertension: Secondary | ICD-10-CM

## 2014-01-27 DIAGNOSIS — K219 Gastro-esophageal reflux disease without esophagitis: Secondary | ICD-10-CM

## 2014-01-27 NOTE — Progress Notes (Signed)
Pre visit review using our clinic review tool, if applicable. No additional management support is needed unless otherwise documented below in the visit note. 

## 2014-01-28 LAB — HM DIABETES EYE EXAM

## 2014-01-28 LAB — HM DIABETES FOOT EXAM

## 2014-01-29 ENCOUNTER — Ambulatory Visit: Payer: BC Managed Care – PPO | Admitting: Internal Medicine

## 2014-01-29 ENCOUNTER — Encounter: Payer: Self-pay | Admitting: Internal Medicine

## 2014-01-31 ENCOUNTER — Encounter: Payer: Self-pay | Admitting: Internal Medicine

## 2014-01-31 NOTE — Assessment & Plan Note (Signed)
Blood pressure better on current regimen.  Follow.

## 2014-01-31 NOTE — Assessment & Plan Note (Signed)
On metformin.  Brought in no recorded sugar readings.  Discussed watching her diet and need for exercise.   Discussed the need for weight loss.  Needs to check and record sugars regularly.  Keep up to date with eye checks. Sees Dr Tempie Donning at [facility].  States everything checked out fine.   She never increased metformin to 1000mg  bid.  Only taking 500mg  bid.  Plans to increase.

## 2014-01-31 NOTE — Progress Notes (Signed)
Subjective:    Patient ID: Lindsey Strickland, female    DOB: [birth year], 38 y.o.   MRN: [id number]  HPI 38 year old female with past history of hypertension, hypercholesterolemia, GERD, diabetes and reoccurring allergy and sinus issues.  She comes in today for a scheduled follow up.  No cough or sob.   No significant acid reflux reported.  On Zantac.  Bowels stable.  Has Flonase.  Not really watching what she eats.  Plans to get more serious about her diet.  Plans to get more regular with exercise.  She plans to go back to the gym.  Taking the100mg  of the Losartan.  Tolerating.  Also taking the pravastatin now.  Tolerating.  Blood pressure better.  Not checking her sugars regularly.  Brought in no recorded sugar readings.   Increased stress with her father's health issues.       Past Medical History  Diagnosis Date  . Hypertension   . Hypercholesterolemia   . GERD (gastroesophageal reflux disease)   . Environmental allergies     Current Outpatient Prescriptions on File Prior to Visit  Medication Sig Dispense Refill  . albuterol (PROVENTIL HFA;VENTOLIN HFA) 108 (90 BASE) MCG/ACT inhaler Inhale 2 puffs into the lungs every 6 (six) hours as needed for wheezing.  1 Inhaler  2  . amLODipine (NORVASC) 5 MG tablet TAKE ONE TABLET BY MOUTH TWICE DAILY  60 tablet  0  . fluticasone (FLONASE) 50 MCG/ACT nasal spray Place 2 sprays into the nose daily.  16 g  3  . glucose blood test strip 1 each by Other route 2 (two) times daily. Use as instructed, use one strip twice a day      . guaiFENesin (MUCINEX) 600 MG 12 hr tablet Take 1,200 mg by mouth 2 (two) times daily as needed.       . Lancets MISC by Does not apply route. Use 1 lancets three times a day      . loratadine (ALAVERT) 10 MG tablet Take 10 mg by mouth daily.      Marland Kitchen losartan (COZAAR) 100 MG tablet TAKE ONE TABLET BY MOUTH ONCE DAILY  30 tablet  5  . metFORMIN (GLUCOPHAGE) 1000 MG tablet Take 1 tablet (1,000 mg total) by mouth 2 (two) times  daily with a meal.  60 tablet  3  . metoprolol succinate (TOPROL-XL) 100 MG 24 hr tablet TAKE ONE-HALF TABLET BY MOUTH TWICE DAILY  30 tablet  5  . pravastatin (PRAVACHOL) 20 MG tablet Take 1 tablet (20 mg total) by mouth daily.  30 tablet  2  . Probiotic Product (ALIGN PO) Take by mouth daily.      . ranitidine (ZANTAC) 150 MG tablet Take 150 mg by mouth once. Take 1 tablet by mouth once a day       No current facility-administered medications on file prior to visit.    Review of Systems Patient denies any headache, lightheadedness or dizziness.  No sinus pressure or fever/chills.  No  tightness or palpitations.  No chest pain.   No increased shortness of breath, cough or congestion.  She has been having to go up hill to work.  This is getting easier.   No nausea or vomiting.  No increased acid reflux.  No abdominal pain or cramping.  No bowel change, such as diarrhea, constipation, BRBPR or melana.   Not checking her sugars regularly.  Taking her medication.  Taking the pravastatin regularly.   Blood pressure better.  Objective:   Physical Exam  Filed Vitals:   01/27/14 0939  BP: 124/64  Pulse: 88  Temp: 98.3 F (36.8 C)   Blood pressure recheck:  126/72, pulse 46  38 year old female in no acute distress.   HEENT:  Nares- clear.  Oropharynx - without lesions. NECK:  Supple.  Nontender.  No audible bruit.  HEART:  Appears to be regular. LUNGS:  No crackles or wheezing audible.  Respirations even and unlabored.  RADIAL PULSE:  Equal bilaterally.  ABDOMEN:  Soft, nontender.  Bowel sounds present and normal.  No audible abdominal bruit.     EXTREMITIES:  No increased edema present.  DP pulses palpable and equal bilaterally.      FEET:  No lesions.        Assessment & Plan:  ALLERGIES.  Continue allavert.  Use Flonase daily.  Saline nasal flushes as directed.    PULMONARY. Breathing is doing well.  Follow.    GI.  Stable.  INCREASED PSYCHOSOCIAL STRESSORS.  Doing well.   Feels she is handling things relatively well.  Follow.   HEALTH MAINTENANCE.  Physical 10/30/13.   Pap 11/03/12 negative with negative HPV.

## 2014-01-31 NOTE — Assessment & Plan Note (Signed)
Low cholesterol diet and exercise.  Taking pravastatin.  Tolerating.  Follow lipid panel and liver function.

## 2014-01-31 NOTE — Assessment & Plan Note (Signed)
Symptoms controlled.  On zantac.  Follow.

## 2014-02-11 ENCOUNTER — Other Ambulatory Visit: Payer: BC Managed Care – PPO

## 2014-02-15 ENCOUNTER — Encounter: Payer: Self-pay | Admitting: Internal Medicine

## 2014-02-16 ENCOUNTER — Other Ambulatory Visit: Payer: Self-pay | Admitting: Internal Medicine

## 2014-02-24 ENCOUNTER — Other Ambulatory Visit: Payer: Self-pay | Admitting: Internal Medicine

## 2014-03-04 ENCOUNTER — Other Ambulatory Visit (INDEPENDENT_AMBULATORY_CARE_PROVIDER_SITE_OTHER): Payer: BC Managed Care – PPO

## 2014-03-04 DIAGNOSIS — I10 Essential (primary) hypertension: Secondary | ICD-10-CM

## 2014-03-04 DIAGNOSIS — E7800 Pure hypercholesterolemia, unspecified: Secondary | ICD-10-CM

## 2014-03-04 DIAGNOSIS — E119 Type 2 diabetes mellitus without complications: Secondary | ICD-10-CM

## 2014-03-04 LAB — BASIC METABOLIC PANEL
BUN: 12 mg/dL (ref 6–23)
CALCIUM: 9.2 mg/dL (ref 8.4–10.5)
CO2: 25 mEq/L (ref 19–32)
Chloride: 104 mEq/L (ref 96–112)
Creatinine, Ser: 0.6 mg/dL (ref 0.4–1.2)
GFR: 130.49 mL/min (ref >60.00)
GLUCOSE: 142 mg/dL — AB (ref 70–99)
Potassium: 4.5 mEq/L (ref 3.5–5.1)
SODIUM: 137 meq/L (ref 135–145)

## 2014-03-04 LAB — LIPID PANEL
Cholesterol: 217 mg/dL — ABNORMAL HIGH (ref 0–200)
HDL: 41 mg/dL (ref >39.00)
NONHDL: 176
TRIGLYCERIDES: 270 mg/dL — AB (ref 0.0–149.0)
Total CHOL/HDL Ratio: 5
VLDL: 54 mg/dL — ABNORMAL HIGH (ref 0.0–40.0)

## 2014-03-04 LAB — HEPATIC FUNCTION PANEL
ALK PHOS: 63 U/L (ref 39–117)
ALT: 15 U/L (ref 0–35)
AST: 19 U/L (ref 0–37)
Albumin: 3.7 g/dL (ref 3.5–5.2)
BILIRUBIN TOTAL: 0.3 mg/dL (ref 0.2–1.2)
Bilirubin, Direct: 0 mg/dL (ref 0.0–0.3)
Total Protein: 7.5 g/dL (ref 6.0–8.3)

## 2014-03-04 LAB — LDL CHOLESTEROL, DIRECT: LDL DIRECT: 145.4 mg/dL

## 2014-03-04 LAB — HEMOGLOBIN A1C: Hgb A1c MFr Bld: 7.1 % — ABNORMAL HIGH (ref 4.6–6.5)

## 2014-03-04 LAB — TSH: TSH: 1.05 u[IU]/mL (ref 0.35–4.50)

## 2014-03-05 ENCOUNTER — Encounter: Payer: Self-pay | Admitting: Internal Medicine

## 2014-03-13 ENCOUNTER — Other Ambulatory Visit: Payer: Self-pay | Admitting: Internal Medicine

## 2014-03-15 MED ORDER — PRAVASTATIN SODIUM 40 MG PO TABS: 40.0000 mg | ORAL_TABLET | Freq: Every day | ORAL | Status: DC

## 2014-03-15 NOTE — Telephone Encounter (Signed)
Sent in rx for pravastatin 40mg  #30 with 3 refills (changed from 20mg ).  See lab.

## 2014-04-05 ENCOUNTER — Encounter: Payer: Self-pay | Admitting: Internal Medicine

## 2014-04-05 MED ORDER — ALBUTEROL SULFATE HFA 108 (90 BASE) MCG/ACT IN AERS: 2.0000 | INHALATION_SPRAY | Freq: Four times a day (QID) | RESPIRATORY_TRACT | Status: DC | PRN

## 2014-04-12 ENCOUNTER — Other Ambulatory Visit: Payer: Self-pay | Admitting: Internal Medicine

## 2014-04-14 ENCOUNTER — Other Ambulatory Visit: Payer: Self-pay | Admitting: Internal Medicine

## 2014-04-14 MED ORDER — LOSARTAN POTASSIUM 100 MG PO TABS: ORAL_TABLET | ORAL | Status: DC

## 2014-05-17 ENCOUNTER — Other Ambulatory Visit: Payer: Self-pay | Admitting: Internal Medicine

## 2014-05-18 ENCOUNTER — Encounter: Payer: Self-pay | Admitting: Internal Medicine

## 2014-06-04 ENCOUNTER — Ambulatory Visit: Payer: BC Managed Care – PPO | Admitting: Internal Medicine

## 2014-06-17 ENCOUNTER — Other Ambulatory Visit: Payer: Self-pay | Admitting: Internal Medicine

## 2014-06-19 IMAGING — CR DG CHEST 2V
2 series · 2 of 2 positions shown · non-contrast
Comparison: None.

CLINICAL DATA: Intermittent dyspnea

EXAM:
CHEST  2 VIEW

[view not recorded (1 of 2)]
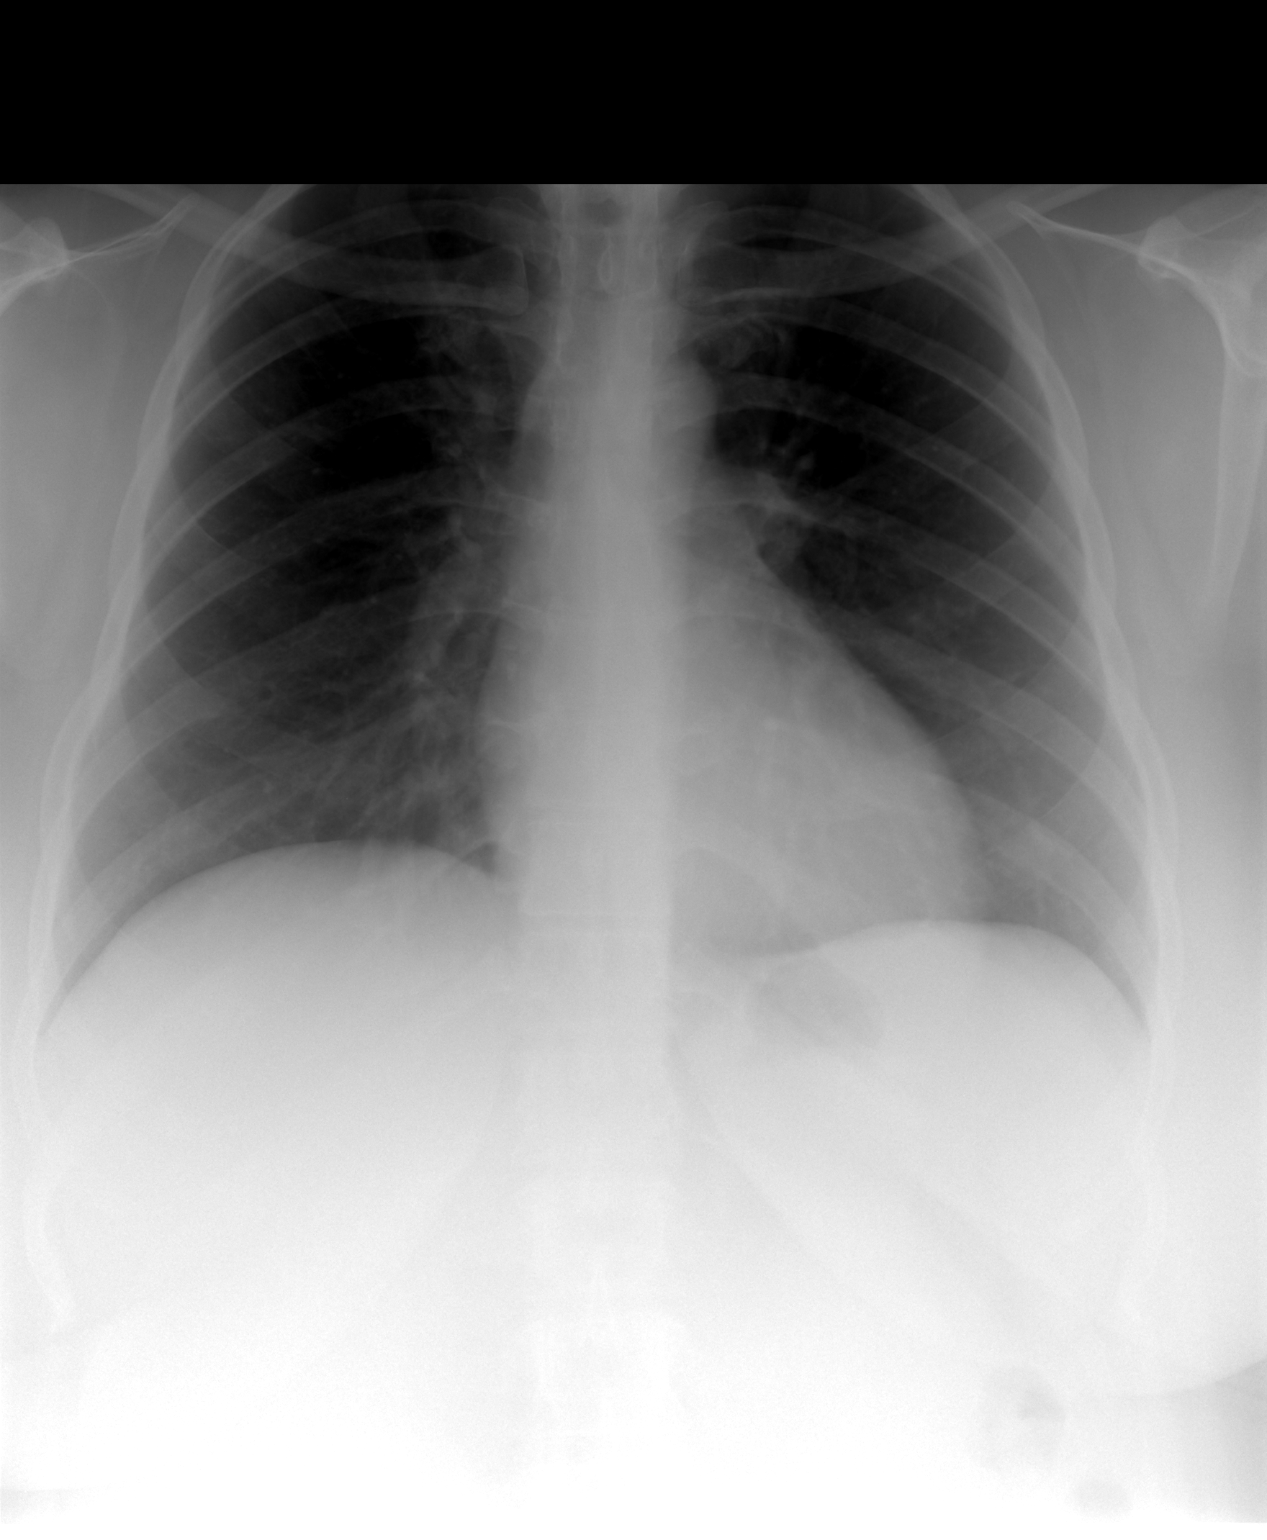

[view not recorded (2 of 2)]
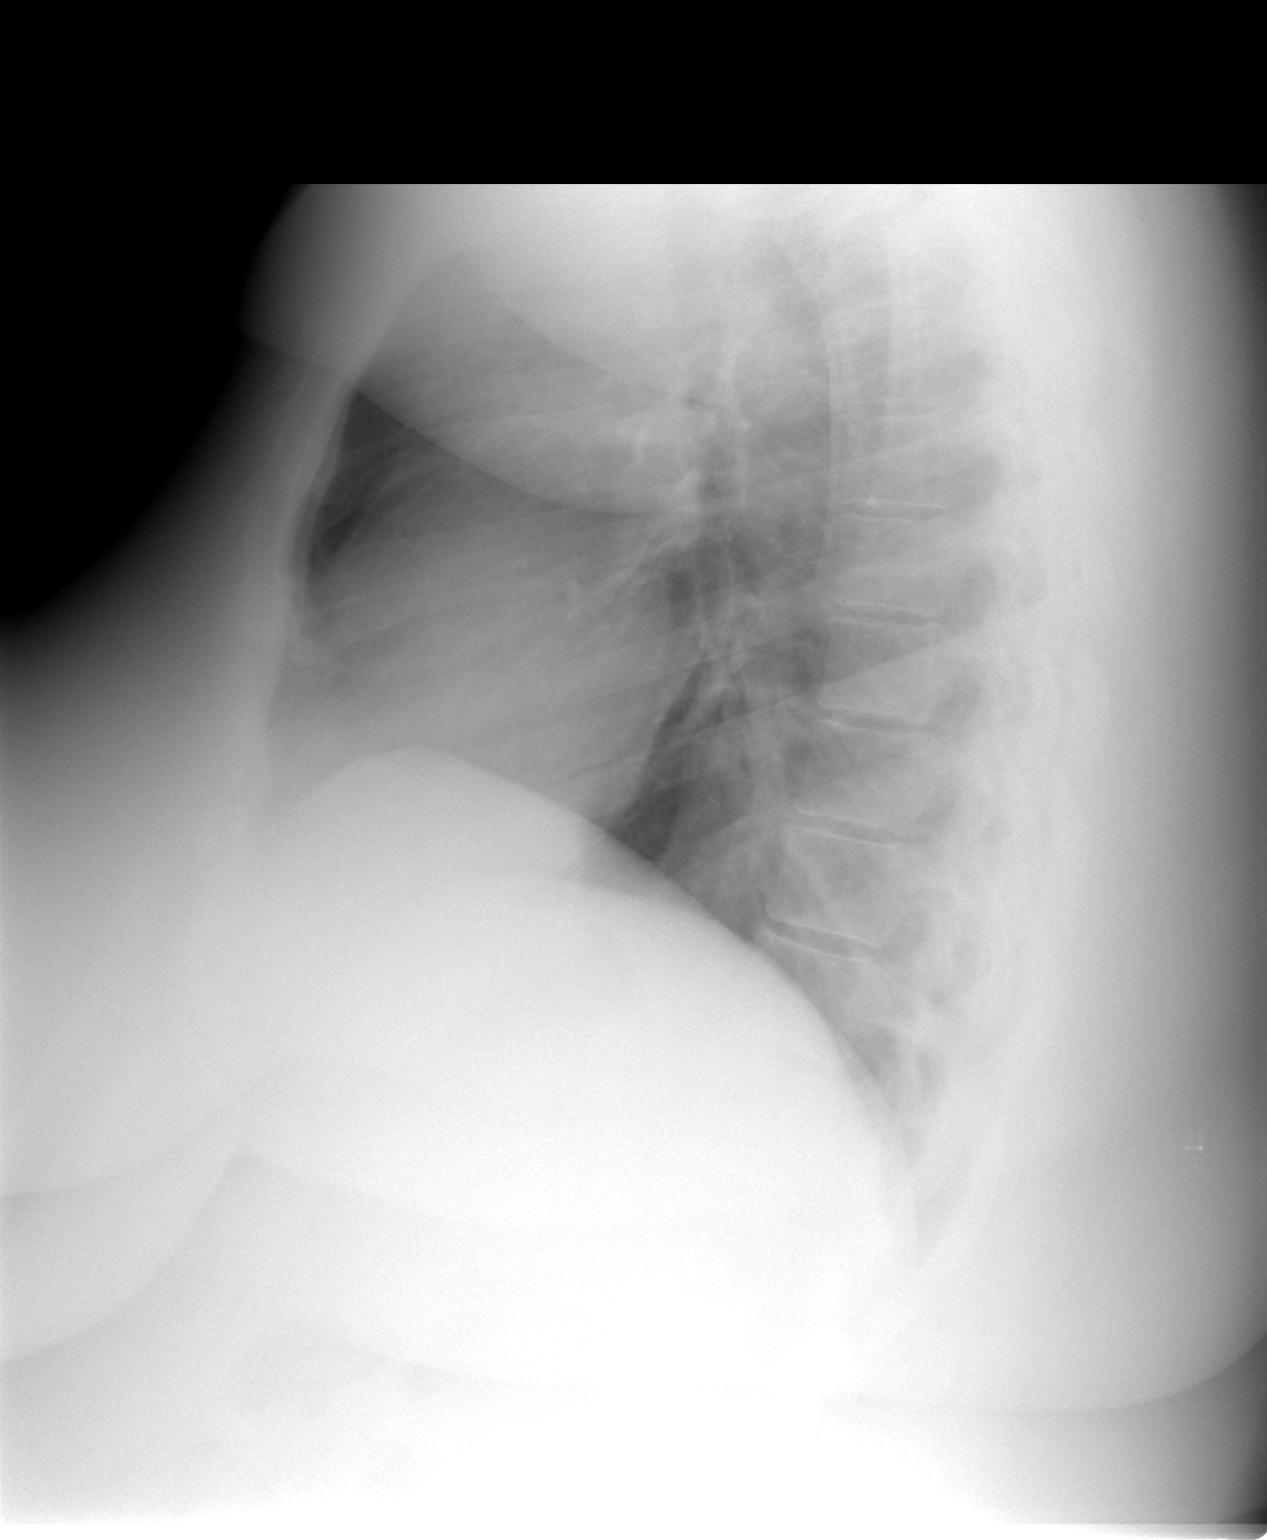

[2 of 2 positions shown; findings below may reference images not displayed]

FINDINGS: The lungs are adequately inflated and clear. There is no
pneumothorax or pleural effusion or pneumomediastinum. The cardiac
silhouette is normal in size. The pulmonary vascularity is not
engorged. The mediastinum is normal in width. The observed portions
of the bony thorax are normal.
IMPRESSION: There is no evidence of active cardiopulmonary disease.

## 2014-06-23 ENCOUNTER — Encounter: Payer: Self-pay | Admitting: Internal Medicine

## 2014-06-23 DIAGNOSIS — H9209 Otalgia, unspecified ear: Secondary | ICD-10-CM

## 2014-06-24 NOTE — Telephone Encounter (Signed)
Order placed for ENT referral.   

## 2014-07-17 ENCOUNTER — Other Ambulatory Visit: Payer: Self-pay | Admitting: Internal Medicine

## 2014-07-19 ENCOUNTER — Other Ambulatory Visit: Payer: Self-pay | Admitting: Internal Medicine

## 2014-07-24 ENCOUNTER — Other Ambulatory Visit: Payer: Self-pay | Admitting: Internal Medicine

## 2014-08-05 ENCOUNTER — Ambulatory Visit (INDEPENDENT_AMBULATORY_CARE_PROVIDER_SITE_OTHER): Payer: BC Managed Care – PPO | Admitting: Internal Medicine

## 2014-08-05 ENCOUNTER — Encounter: Payer: Self-pay | Admitting: Internal Medicine

## 2014-08-05 DIAGNOSIS — K219 Gastro-esophageal reflux disease without esophagitis: Secondary | ICD-10-CM

## 2014-08-05 DIAGNOSIS — I10 Essential (primary) hypertension: Secondary | ICD-10-CM

## 2014-08-05 DIAGNOSIS — E7800 Pure hypercholesterolemia, unspecified: Secondary | ICD-10-CM

## 2014-08-05 DIAGNOSIS — E119 Type 2 diabetes mellitus without complications: Secondary | ICD-10-CM

## 2014-08-05 DIAGNOSIS — J0191 Acute recurrent sinusitis, unspecified: Secondary | ICD-10-CM

## 2014-08-05 MED ORDER — CEFDINIR 300 MG PO CAPS: 300.0000 mg | ORAL_CAPSULE | Freq: Two times a day (BID) | ORAL | Status: DC

## 2014-08-05 NOTE — Progress Notes (Signed)
Pre visit review using our clinic review tool, if applicable. No additional management support is needed unless otherwise documented below in the visit note. 

## 2014-08-08 ENCOUNTER — Encounter: Payer: Self-pay | Admitting: Internal Medicine

## 2014-08-08 DIAGNOSIS — J329 Chronic sinusitis, unspecified: Secondary | ICD-10-CM | POA: Insufficient documentation

## 2014-08-08 NOTE — Progress Notes (Signed)
Subjective:    Patient ID: Lindsey Strickland, female    DOB: [birth year], 39 y.o.   MRN: [id number]  HPI 39 year old female with past history of hypertension, hypercholesterolemia, GERD, diabetes and reoccurring allergy and sinus issues.  She comes in today for a scheduled follow up.  She reports she was seen in acute care 06/14/14.  Diagnosed with ear infection.  Treated with levaquin and prednisone.   Had f/u at acute care.  No infection.  Persistent problems.  Referred to ENT for evaluation.  Symptoms improved. Now with increased congestion and thick mucus.  (colored - green).   No cough or sob.   No significant acid reflux reported.  On Zantac.  Bowels stable.  Has Flonase.  Not watching what she eats.  Plans to get more serious about her diet.  Plans to get more regular exercise.   Not checking her sugars regularly.  Brought in no recorded sugar readings.          Past Medical History  Diagnosis Date  . Hypertension   . Hypercholesterolemia   . GERD (gastroesophageal reflux disease)   . Environmental allergies     Current Outpatient Prescriptions on File Prior to Visit  Medication Sig Dispense Refill  . albuterol (PROVENTIL HFA;VENTOLIN HFA) 108 (90 BASE) MCG/ACT inhaler Inhale 2 puffs into the lungs every 6 (six) hours as needed for wheezing. 1 Inhaler 1  . amLODipine (NORVASC) 5 MG tablet TAKE ONE TABLET BY MOUTH TWICE DAILY 60 tablet 5  . fluticasone (FLONASE) 50 MCG/ACT nasal spray Place 2 sprays into the nose daily. 16 g 3  . glucose blood test strip 1 each by Other route 2 (two) times daily. Use as instructed, use one strip twice a day    . guaiFENesin (MUCINEX) 600 MG 12 hr tablet Take 1,200 mg by mouth 2 (two) times daily as needed.     . Lancets MISC by Does not apply route. Use 1 lancets three times a day    . loratadine (ALAVERT) 10 MG tablet Take 10 mg by mouth daily.    Marland Kitchen losartan (COZAAR) 100 MG tablet TAKE ONE TABLET BY MOUTH ONCE DAILY 30 tablet 5  . metFORMIN  (GLUCOPHAGE) 1000 MG tablet TAKE ONE TABLET BY MOUTH TWICE DAILY WITH A MEAL 60 tablet 0  . metoprolol succinate (TOPROL-XL) 100 MG 24 hr tablet TAKE ONE-HALF TABLET BY MOUTH TWICE DAILY 30 tablet 5  . pravastatin (PRAVACHOL) 40 MG tablet TAKE ONE TABLET BY MOUTH ONCE DAILY 30 tablet 0  . Probiotic Product (ALIGN PO) Take by mouth daily.    . ranitidine (ZANTAC) 150 MG tablet Take 150 mg by mouth once. Take 1 tablet by mouth once a day     No current facility-administered medications on file prior to visit.    Review of Systems Patient denies any headache, lightheadedness or dizziness.  Increased congestion as outlined.  No chest congestion.  No  tightness or palpitations.  No chest pain.   No increased shortness of breath.    No nausea or vomiting.  No increased acid reflux.  No abdominal pain or cramping.  No bowel change, such as diarrhea, constipation, BRBPR or melana.   Not checking her sugars regularly. Not watching her diet.  No exercising.   Taking her medication.  Taking the pravastatin regularly.        Objective:   Physical Exam  Filed Vitals:   08/05/14 1256  BP: 140/70  Pulse: 103  Temp: 98.4  F (66.31 C)   39 year old female in no acute distress.   HEENT:  Nares- erythematous turbinates.   Oropharynx - without lesions.  Question minimal fluid - left TM.  No erythema.   NECK:  Supple.  Nontender.  No audible bruit.  HEART:  Appears to be regular. LUNGS:  No crackles or wheezing audible.  Respirations even and unlabored.  RADIAL PULSE:  Equal bilaterally.  ABDOMEN:  Soft, nontender.  Bowel sounds present and normal.  No audible abdominal bruit.     EXTREMITIES:  No increased edema present.  DP pulses palpable and equal bilaterally.      FEET:  No lesions.        Assessment & Plan:  1. Severe obesity (BMI >= 40) Diet and exercise.    2. Essential hypertension Blood pressure slightly elevated today.  Treat sinus infection.  Follow pressures.  See if blood pressure  improves.    3. Gastroesophageal reflux disease without esophagitis Controlled on zantac.  Follow.    4. Type 2 diabetes mellitus without complication Brought in no recorded sugar readings.  Discussed importance of diet and exercise.  Discussed weight loss.  Keep up to date with eye exams.  Continue metformin.  Check met b and a1c.    5. Hypercholesterolemia Low cholesterol diet and exercise.  On pravastatin.  Check lipid panel and liver function tests.    6. Acute recurrent sinusitis, unspecified location Treat with omnicef as directed.  Saline nasal spray and nasacort nasal spray as directed.  Follow.   7. PULMONARY. Breathing is doing well.  Follow.    8. GI.  Stable.  9. INCREASED PSYCHOSOCIAL STRESSORS.  Doing well.  Feels she is handling things relatively well.  Follow.   HEALTH MAINTENANCE.  Physical 10/30/13.   Pap 11/03/12 negative with negative HPV.    I spent 25 minutes with the patient and more than 50% of the time was spent in consultation regarding the above.

## 2014-08-12 ENCOUNTER — Telehealth: Payer: Self-pay | Admitting: *Deleted

## 2014-08-12 DIAGNOSIS — E119 Type 2 diabetes mellitus without complications: Secondary | ICD-10-CM

## 2014-08-12 DIAGNOSIS — D72829 Elevated white blood cell count, unspecified: Secondary | ICD-10-CM

## 2014-08-12 DIAGNOSIS — E7800 Pure hypercholesterolemia, unspecified: Secondary | ICD-10-CM

## 2014-08-12 DIAGNOSIS — I10 Essential (primary) hypertension: Secondary | ICD-10-CM

## 2014-08-12 NOTE — Telephone Encounter (Signed)
Orders placed for labs

## 2014-08-12 NOTE — Telephone Encounter (Signed)
Pt is coming in tomorrow what labs and dx?  

## 2014-08-13 ENCOUNTER — Other Ambulatory Visit: Payer: BC Managed Care – PPO

## 2014-08-16 ENCOUNTER — Other Ambulatory Visit: Payer: Self-pay | Admitting: Internal Medicine

## 2014-08-17 ENCOUNTER — Encounter: Payer: Self-pay | Admitting: Internal Medicine

## 2014-08-18 ENCOUNTER — Other Ambulatory Visit (INDEPENDENT_AMBULATORY_CARE_PROVIDER_SITE_OTHER): Payer: BC Managed Care – PPO

## 2014-08-18 DIAGNOSIS — E7800 Pure hypercholesterolemia, unspecified: Secondary | ICD-10-CM

## 2014-08-18 DIAGNOSIS — E119 Type 2 diabetes mellitus without complications: Secondary | ICD-10-CM

## 2014-08-18 LAB — BASIC METABOLIC PANEL
BUN: 13 mg/dL (ref 6–23)
CHLORIDE: 102 meq/L (ref 96–112)
CO2: 26 meq/L (ref 19–32)
Calcium: 9.2 mg/dL (ref 8.4–10.5)
Creatinine, Ser: 0.44 mg/dL (ref 0.40–1.20)
GFR: 171.91 mL/min (ref >60.00)
Glucose, Bld: 137 mg/dL — ABNORMAL HIGH (ref 70–99)
Potassium: 4.4 mEq/L (ref 3.5–5.1)
SODIUM: 136 meq/L (ref 135–145)

## 2014-08-18 LAB — CBC WITH DIFFERENTIAL/PLATELET
BASOS PCT: 0.5 % (ref 0.0–3.0)
Basophils Absolute: 0 10*3/uL (ref 0.0–0.1)
EOS ABS: 0.1 10*3/uL (ref 0.0–0.7)
Eosinophils Relative: 1 % (ref 0.0–5.0)
HCT: 37.4 % (ref 36.0–46.0)
Hemoglobin: 11.9 g/dL — ABNORMAL LOW (ref 12.0–15.0)
Lymphocytes Relative: 25.4 % (ref 12.0–46.0)
Lymphs Abs: 2.4 10*3/uL (ref 0.7–4.0)
MCHC: 31.9 g/dL (ref 30.0–36.0)
MCV: 80.9 fl (ref 78.0–100.0)
MONO ABS: 0.6 10*3/uL (ref 0.1–1.0)
Monocytes Relative: 6.5 % (ref 3.0–12.0)
Neutro Abs: 6.2 10*3/uL (ref 1.4–7.7)
Neutrophils Relative %: 66.6 % (ref 43.0–77.0)
PLATELETS: 383 10*3/uL (ref 150.0–400.0)
RBC: 4.63 Mil/uL (ref 3.87–5.11)
RDW: 14.7 % (ref 11.5–15.5)
WBC: 9.3 10*3/uL (ref 4.0–10.5)

## 2014-08-18 LAB — HEPATIC FUNCTION PANEL
ALK PHOS: 62 U/L (ref 39–117)
ALT: 13 U/L (ref 0–35)
AST: 12 U/L (ref 0–37)
Albumin: 4 g/dL (ref 3.5–5.2)
Bilirubin, Direct: 0 mg/dL (ref 0.0–0.3)
TOTAL PROTEIN: 7.1 g/dL (ref 6.0–8.3)
Total Bilirubin: 0.3 mg/dL (ref 0.2–1.2)

## 2014-08-18 LAB — LIPID PANEL
Cholesterol: 163 mg/dL (ref 0–200)
HDL: 36.6 mg/dL — ABNORMAL LOW (ref >39.00)
LDL Cholesterol: 89 mg/dL (ref 0–99)
NonHDL: 126.4
TRIGLYCERIDES: 186 mg/dL — AB (ref 0.0–149.0)
Total CHOL/HDL Ratio: 4
VLDL: 37.2 mg/dL (ref 0.0–40.0)

## 2014-08-18 LAB — HEMOGLOBIN A1C: Hgb A1c MFr Bld: 7.2 % — ABNORMAL HIGH (ref 4.6–6.5)

## 2014-08-19 ENCOUNTER — Encounter: Payer: Self-pay | Admitting: *Deleted

## 2014-08-19 ENCOUNTER — Other Ambulatory Visit: Payer: Self-pay | Admitting: Internal Medicine

## 2014-08-19 DIAGNOSIS — D649 Anemia, unspecified: Secondary | ICD-10-CM

## 2014-08-19 NOTE — Progress Notes (Signed)
Order placed for f/u labs.  

## 2014-08-23 ENCOUNTER — Other Ambulatory Visit: Payer: Self-pay | Admitting: Internal Medicine

## 2014-08-24 ENCOUNTER — Other Ambulatory Visit: Payer: Self-pay | Admitting: Internal Medicine

## 2014-09-22 ENCOUNTER — Encounter: Payer: Self-pay | Admitting: Internal Medicine

## 2014-10-04 ENCOUNTER — Ambulatory Visit: Payer: BC Managed Care – PPO | Admitting: Internal Medicine

## 2014-11-07 ENCOUNTER — Other Ambulatory Visit: Payer: Self-pay | Admitting: Internal Medicine

## 2014-11-24 ENCOUNTER — Ambulatory Visit: Payer: BC Managed Care – PPO | Admitting: Internal Medicine

## 2014-11-25 ENCOUNTER — Ambulatory Visit: Payer: BC Managed Care – PPO | Admitting: Internal Medicine

## 2014-12-13 ENCOUNTER — Other Ambulatory Visit: Payer: Self-pay | Admitting: Internal Medicine

## 2014-12-13 NOTE — Telephone Encounter (Signed)
Last OV 12.31.15 w/3 cancellations.  Next OV 7.7.16.  Please advise refill

## 2014-12-13 NOTE — Telephone Encounter (Signed)
Pt advised 7.7.16 appoint must be kept.

## 2014-12-13 NOTE — Telephone Encounter (Signed)
Can refill x2 (until appt), but needs to be told has to keep next appt.

## 2015-02-10 ENCOUNTER — Encounter: Payer: Self-pay | Admitting: Internal Medicine

## 2015-02-10 ENCOUNTER — Ambulatory Visit (INDEPENDENT_AMBULATORY_CARE_PROVIDER_SITE_OTHER): Payer: BC Managed Care – PPO | Admitting: Internal Medicine

## 2015-02-10 VITALS — BP 137/84 | HR 86 | Temp 98.1°F | Resp 18 | Ht 67.0 in | Wt 294.5 lb

## 2015-02-10 DIAGNOSIS — K219 Gastro-esophageal reflux disease without esophagitis: Secondary | ICD-10-CM

## 2015-02-10 DIAGNOSIS — Z0000 Encounter for general adult medical examination without abnormal findings: Secondary | ICD-10-CM

## 2015-02-10 DIAGNOSIS — I10 Essential (primary) hypertension: Secondary | ICD-10-CM | POA: Diagnosis not present

## 2015-02-10 DIAGNOSIS — D649 Anemia, unspecified: Secondary | ICD-10-CM

## 2015-02-10 DIAGNOSIS — E119 Type 2 diabetes mellitus without complications: Secondary | ICD-10-CM | POA: Diagnosis not present

## 2015-02-10 DIAGNOSIS — N920 Excessive and frequent menstruation with regular cycle: Secondary | ICD-10-CM

## 2015-02-10 DIAGNOSIS — E7800 Pure hypercholesterolemia, unspecified: Secondary | ICD-10-CM

## 2015-02-10 DIAGNOSIS — R8290 Unspecified abnormal findings in urine: Secondary | ICD-10-CM

## 2015-02-10 DIAGNOSIS — R1032 Left lower quadrant pain: Secondary | ICD-10-CM

## 2015-02-10 LAB — CBC WITH DIFFERENTIAL/PLATELET
BASOS ABS: 0 10*3/uL (ref 0.0–0.1)
Basophils Relative: 0.3 % (ref 0.0–3.0)
Eosinophils Absolute: 0.1 10*3/uL (ref 0.0–0.7)
Eosinophils Relative: 1.2 % (ref 0.0–5.0)
HCT: 35.5 % — ABNORMAL LOW (ref 36.0–46.0)
HEMOGLOBIN: 11.5 g/dL — AB (ref 12.0–15.0)
LYMPHS PCT: 29.6 % (ref 12.0–46.0)
Lymphs Abs: 3.3 10*3/uL (ref 0.7–4.0)
MCHC: 32.5 g/dL (ref 30.0–36.0)
MCV: 79.2 fl (ref 78.0–100.0)
MONO ABS: 0.6 10*3/uL (ref 0.1–1.0)
MONOS PCT: 5.6 % (ref 3.0–12.0)
Neutro Abs: 7 10*3/uL (ref 1.4–7.7)
Neutrophils Relative %: 63.3 % (ref 43.0–77.0)
PLATELETS: 429 10*3/uL — AB (ref 150.0–400.0)
RBC: 4.48 Mil/uL (ref 3.87–5.11)
RDW: 15.4 % (ref 11.5–15.5)
WBC: 11.1 10*3/uL — ABNORMAL HIGH (ref 4.0–10.5)

## 2015-02-10 LAB — BASIC METABOLIC PANEL
BUN: 11 mg/dL (ref 6–23)
CHLORIDE: 102 meq/L (ref 96–112)
CO2: 27 mEq/L (ref 19–32)
Calcium: 9.7 mg/dL (ref 8.4–10.5)
Creatinine, Ser: 0.48 mg/dL (ref 0.40–1.20)
GFR: 155.07 mL/min (ref >60.00)
Glucose, Bld: 111 mg/dL — ABNORMAL HIGH (ref 70–99)
POTASSIUM: 4.6 meq/L (ref 3.5–5.1)
SODIUM: 138 meq/L (ref 135–145)

## 2015-02-10 LAB — URINALYSIS, ROUTINE W REFLEX MICROSCOPIC
Bilirubin Urine: NEGATIVE
KETONES UR: NEGATIVE
LEUKOCYTES UA: NEGATIVE
NITRITE: NEGATIVE
PH: 6 (ref 5.0–8.0)
Specific Gravity, Urine: 1.025 (ref 1.000–1.030)
Total Protein, Urine: NEGATIVE
UROBILINOGEN UA: 0.2 (ref 0.0–1.0)
Urine Glucose: NEGATIVE

## 2015-02-10 LAB — IBC PANEL
IRON: 48 ug/dL (ref 42–145)
SATURATION RATIOS: 10.3 % — AB (ref 20.0–50.0)
TRANSFERRIN: 334 mg/dL (ref 212.0–360.0)

## 2015-02-10 LAB — HEPATIC FUNCTION PANEL
ALK PHOS: 61 U/L (ref 39–117)
ALT: 12 U/L (ref 0–35)
AST: 11 U/L (ref 0–37)
Albumin: 4 g/dL (ref 3.5–5.2)
Bilirubin, Direct: 0 mg/dL (ref 0.0–0.3)
TOTAL PROTEIN: 6.9 g/dL (ref 6.0–8.3)
Total Bilirubin: 0.2 mg/dL (ref 0.2–1.2)

## 2015-02-10 LAB — MICROALBUMIN / CREATININE URINE RATIO
CREATININE, U: 105.1 mg/dL
Microalb Creat Ratio: 1 mg/g (ref 0.0–30.0)
Microalb, Ur: 1 mg/dL (ref 0.0–1.9)

## 2015-02-10 LAB — FERRITIN: Ferritin: 6.5 ng/mL — ABNORMAL LOW (ref 10.0–291.0)

## 2015-02-10 LAB — HEMOGLOBIN A1C: Hgb A1c MFr Bld: 6.6 % — ABNORMAL HIGH (ref 4.6–6.5)

## 2015-02-10 LAB — VITAMIN B12: Vitamin B-12: 337 pg/mL (ref 211–911)

## 2015-02-10 LAB — TSH: TSH: 3.06 u[IU]/mL (ref 0.35–4.50)

## 2015-02-10 NOTE — Progress Notes (Signed)
Pre visit review using our clinic review tool, if applicable. No additional management support is needed unless otherwise documented below in the visit note. 

## 2015-02-10 NOTE — Progress Notes (Signed)
Patient ID: Lindsey Strickland, female   DOB: 07-28-1976, 39 y.o.   MRN: [id number]   Subjective:    Patient ID: Lindsey Strickland, female    DOB: April 06, 1976, 39 y.o.   MRN: [id number]  HPI  Patient here for a scheduled follow up.  She has not been checking her sugars.  Is trying to watch what she eats.  Discussed staying active.  She reports heavy periods. Passing clots.  Some fatigue.  Not taking iron supplements.  Discussed further w/up.  Request referral to gyn.  Some pain - left lower quadrant.  Intermittent.  Last only a short amount of time.  (minutes),  Last episode was a few weeks ago.  No triggers.  Bowels stable.  Reports an odor to her urine.  Concerned about a possible infection.     Past Medical History  Diagnosis Date  . Hypertension   . Hypercholesterolemia   . GERD (gastroesophageal reflux disease)   . Environmental allergies   . Diabetes mellitus     Current Outpatient Prescriptions on File Prior to Visit  Medication Sig Dispense Refill  . albuterol (PROVENTIL HFA;VENTOLIN HFA) 108 (90 BASE) MCG/ACT inhaler Inhale 2 puffs into the lungs every 6 (six) hours as needed for wheezing. 1 Inhaler 1  . fluticasone (FLONASE) 50 MCG/ACT nasal spray Place 2 sprays into the nose daily. 16 g 3  . glucose blood test strip 1 each by Other route 2 (two) times daily. Use as instructed, use one strip twice a day    . guaiFENesin (MUCINEX) 600 MG 12 hr tablet Take 1,200 mg by mouth 2 (two) times daily as needed.     . Lancets MISC by Does not apply route. Use 1 lancets three times a day    . loratadine (ALAVERT) 10 MG tablet Take 10 mg by mouth daily.    Marland Kitchen losartan (COZAAR) 100 MG tablet TAKE ONE TABLET BY MOUTH ONCE DAILY 30 tablet 6  . metFORMIN (GLUCOPHAGE) 1000 MG tablet TAKE ONE TABLET BY MOUTH TWICE DAILY WITH A MEAL. 60 tablet 5  . metoprolol succinate (TOPROL-XL) 100 MG 24 hr tablet TAKE ONE-HALF TABLET BY MOUTH TWICE DAILY 30 tablet 2  . Probiotic Product (ALIGN PO) Take by mouth  daily.    . ranitidine (ZANTAC) 150 MG tablet Take 150 mg by mouth once. Take 1 tablet by mouth once a day     No current facility-administered medications on file prior to visit.    Review of Systems  Constitutional: Negative for appetite change and unexpected weight change.  HENT: Negative for congestion and sinus pressure.   Respiratory: Negative for cough, chest tightness and shortness of breath.   Cardiovascular: Negative for chest pain, palpitations and leg swelling.  Gastrointestinal: Positive for abdominal distention (left lower quadrant pain - intermittent as outlined. ). Negative for nausea, vomiting and diarrhea.  Genitourinary: Negative for difficulty urinating.       Bad odor to urine.    Musculoskeletal: Negative for back pain and joint swelling.  Skin: Negative for color change and rash.  Neurological: Negative for dizziness, light-headedness and headaches.  Psychiatric/Behavioral: Negative for dysphoric mood and agitation.       Objective:     Blood pressure recheck:  134/82, pulse 84  Physical Exam  Constitutional: She appears well-developed and well-nourished. No distress.  HENT:  Nose: Nose normal.  Mouth/Throat: Oropharynx is clear and moist.  Neck: Neck supple. No thyromegaly present.  Cardiovascular: Normal rate and regular rhythm.  Pulmonary/Chest: Breath sounds normal. No respiratory distress. She has no wheezes.  Abdominal: Soft. Bowel sounds are normal. There is no tenderness.  Musculoskeletal: She exhibits no edema or tenderness.  Lymphadenopathy:    She has no cervical adenopathy.  Skin: No rash noted. No erythema.  Psychiatric: She has a normal mood and affect. Her behavior is normal.    BP 137/84 mmHg  Pulse 86  Temp(Src) 98.1 F (36.7 C) (Oral)  Resp 18  Ht '5\' 7"'  (1.702 m)  Wt 294 lb 8 oz (133.584 kg)  BMI 46.11 kg/m2  SpO2 100% Wt Readings from Last 3 Encounters:  02/10/15 294 lb 8 oz (133.584 kg)  08/05/14 299 lb 8 oz (135.852 kg)    01/27/14 305 lb 4 oz (138.46 kg)     Lab Results  Component Value Date   WBC 11.1* 02/10/2015   HGB 11.5* 02/10/2015   HCT 35.5* 02/10/2015   PLT 429.0* 02/10/2015   GLUCOSE 111* 02/10/2015   CHOL 163 08/18/2014   TRIG 186.0* 08/18/2014   HDL 36.60* 08/18/2014   LDLDIRECT 145.4 03/04/2014   LDLCALC 89 08/18/2014   ALT 12 02/10/2015   AST 11 02/10/2015   NA 138 02/10/2015   K 4.6 02/10/2015   CL 102 02/10/2015   CREATININE 0.48 02/10/2015   BUN 11 02/10/2015   CO2 27 02/10/2015   TSH 3.06 02/10/2015   HGBA1C 6.6* 02/10/2015   MICROALBUR 1.0 02/10/2015       Assessment & Plan:   Problem List Items Addressed This Visit    Bad odor of urine    Check urinalysis and culture to confirm no infection.        Relevant Orders   Urinalysis, Routine w reflex microscopic (not at [facility]) (Completed)   CULTURE, URINE COMPREHENSIVE (Completed)   Diabetes mellitus    On metformin.  Brought in no recorded sugar readings.  Discussed watching her diet and exercise.  Needs to check and record sugars regularly.  Keep up to date with eye checks.  Follow met b and a1c.  Check today.       Relevant Orders   Hemoglobin A1c (Completed)   Microalbumin / creatinine urine ratio (Completed)   GERD (gastroesophageal reflux disease)    Controlled.  Takes zantac.        Health care maintenance    Schedule her for a physical.  PAP 10/17/12 - negative with negative HPV.        Hypercholesterolemia    On pravastatin.  Low cholesterol diet and exercise.  Follow lipid panel and liver function tests.        Relevant Orders   Hepatic function panel (Completed)   Hypertension - Primary    Blood pressure doing well.  Same medication regimen.  Follow pressures.  Follow metabolic panel.        Relevant Orders   Basic metabolic panel (Completed)   TSH (Completed)   LLQ abdominal pain    Intermittent as outlined.  No known triggers.  Discussed further w/up.  Pursue gyn evaluation.  Hold on  further testing and/or scanning.  Follow.  Get her back in soon to reassess.        Menorrhagia    Increased clotting with her periods.  Periods heavy.  Check cbc.  [facility] for further evaluation.        Relevant Orders   Ambulatory referral to Gynecology   Severe obesity (BMI >= 40)    Diet and exercise.  Other Visit Diagnoses    Anemia, unspecified anemia type          I spent 25 minutes with the patient and more than 50% of the time was spent in consultation regarding the above.     Einar Pheasant, MD

## 2015-02-11 ENCOUNTER — Telehealth: Payer: Self-pay | Admitting: Internal Medicine

## 2015-02-11 ENCOUNTER — Encounter: Payer: Self-pay | Admitting: Internal Medicine

## 2015-02-11 ENCOUNTER — Other Ambulatory Visit: Payer: Self-pay | Admitting: Internal Medicine

## 2015-02-11 DIAGNOSIS — E7800 Pure hypercholesterolemia, unspecified: Secondary | ICD-10-CM

## 2015-02-11 DIAGNOSIS — D509 Iron deficiency anemia, unspecified: Secondary | ICD-10-CM

## 2015-02-11 NOTE — Telephone Encounter (Signed)
Pt was notified of labs via my chart.  Needs a fasting lab appt in 3-4 weeks.  Please notify the patient of the appointment date and time.  Thanks.

## 2015-02-12 LAB — CULTURE, URINE COMPREHENSIVE: Colony Count: >=100000

## 2015-02-13 ENCOUNTER — Encounter: Payer: Self-pay | Admitting: Internal Medicine

## 2015-02-13 DIAGNOSIS — R1032 Left lower quadrant pain: Secondary | ICD-10-CM | POA: Insufficient documentation

## 2015-02-13 DIAGNOSIS — Z0000 Encounter for general adult medical examination without abnormal findings: Secondary | ICD-10-CM | POA: Insufficient documentation

## 2015-02-13 MED ORDER — CIPROFLOXACIN HCL 500 MG PO TABS: 500.0000 mg | ORAL_TABLET | Freq: Two times a day (BID) | ORAL | Status: DC

## 2015-02-13 NOTE — Telephone Encounter (Signed)
rx sent in for cipro.  Pt notified and notified to take probiotics.

## 2015-02-13 NOTE — Assessment & Plan Note (Signed)
Check urinalysis and culture to confirm no infection.    

## 2015-02-13 NOTE — Assessment & Plan Note (Signed)
On metformin.  Brought in no recorded sugar readings.  Discussed watching her diet and exercise.  Needs to check and record sugars regularly.  Keep up to date with eye checks.  Follow met b and a1c.  Check today.

## 2015-02-13 NOTE — Assessment & Plan Note (Signed)
On pravastatin.  Low cholesterol diet and exercise.  Follow lipid panel and liver function tests.   

## 2015-02-13 NOTE — Assessment & Plan Note (Signed)
Diet and exercise.   

## 2015-02-13 NOTE — Assessment & Plan Note (Signed)
Increased clotting with her periods.  Periods heavy.  Check cbc.  [facility] for further evaluation.

## 2015-02-13 NOTE — Assessment & Plan Note (Signed)
Blood pressure doing well.  Same medication regimen.  Follow pressures.  Follow metabolic panel.   

## 2015-02-13 NOTE — Assessment & Plan Note (Signed)
Intermittent as outlined.  No known triggers.  Discussed further w/up.  Pursue gyn evaluation.  Hold on further testing and/or scanning.  Follow.  Get her back in soon to reassess.

## 2015-02-13 NOTE — Assessment & Plan Note (Signed)
Controlled.  Takes zantac.

## 2015-02-13 NOTE — Assessment & Plan Note (Signed)
Schedule her for a physical.  PAP 10/17/12 - negative with negative HPV.

## 2015-02-15 ENCOUNTER — Encounter: Payer: Self-pay | Admitting: Internal Medicine

## 2015-02-15 MED ORDER — CEPHALEXIN 500 MG PO CAPS: 500.0000 mg | ORAL_CAPSULE | Freq: Three times a day (TID) | ORAL | Status: DC

## 2015-02-15 NOTE — Telephone Encounter (Signed)
rx sent in for keflex.  Pt notified via my chart.  Will stop cipro.  See her message.

## 2015-02-20 ENCOUNTER — Other Ambulatory Visit: Payer: Self-pay | Admitting: Internal Medicine

## 2015-03-01 ENCOUNTER — Encounter: Payer: Self-pay | Admitting: Obstetrics and Gynecology

## 2015-03-06 ENCOUNTER — Other Ambulatory Visit: Payer: Self-pay | Admitting: Internal Medicine

## 2015-03-11 ENCOUNTER — Other Ambulatory Visit: Payer: BC Managed Care – PPO

## 2015-03-22 ENCOUNTER — Other Ambulatory Visit: Payer: Self-pay | Admitting: Internal Medicine

## 2015-03-31 ENCOUNTER — Encounter: Payer: Self-pay | Admitting: Obstetrics and Gynecology

## 2015-03-31 ENCOUNTER — Other Ambulatory Visit: Payer: BC Managed Care – PPO

## 2015-04-12 ENCOUNTER — Ambulatory Visit: Payer: BC Managed Care – PPO | Admitting: Internal Medicine

## 2015-04-12 ENCOUNTER — Other Ambulatory Visit: Payer: Self-pay | Admitting: Internal Medicine

## 2015-04-13 NOTE — Telephone Encounter (Signed)
Last OV 7.7.16 with several no shows.  Please advise refill

## 2015-04-13 NOTE — Telephone Encounter (Signed)
Missed her appt this week.  Call pt and let reschedule 30 min f/u appt.  Ok to refill until appt.

## 2015-04-14 ENCOUNTER — Encounter: Payer: Self-pay | Admitting: Internal Medicine

## 2015-04-14 NOTE — Telephone Encounter (Signed)
I spoke with the pt.  She states she can not schedule an appoint at this time.  Pt further states she did not cancel the appoints that the office called her to cancel the appoints.  I refilled the medication for 30 days.

## 2015-04-20 ENCOUNTER — Other Ambulatory Visit: Payer: Self-pay | Admitting: Internal Medicine

## 2015-04-20 NOTE — Telephone Encounter (Signed)
Refilled metformin #60 with 1 refill.  Pt had labs in 02/2015.  Messaged her to make appt.

## 2015-04-20 NOTE — Telephone Encounter (Signed)
Patient was a no show for labs and an appointment, please advise refill.

## 2015-05-06 ENCOUNTER — Encounter: Payer: Self-pay | Admitting: Obstetrics and Gynecology

## 2015-05-10 ENCOUNTER — Other Ambulatory Visit: Payer: Self-pay | Admitting: Internal Medicine

## 2015-06-06 ENCOUNTER — Other Ambulatory Visit: Payer: Self-pay | Admitting: Internal Medicine

## 2015-06-09 ENCOUNTER — Other Ambulatory Visit: Payer: Self-pay | Admitting: Internal Medicine

## 2015-06-20 ENCOUNTER — Other Ambulatory Visit: Payer: Self-pay | Admitting: Internal Medicine

## 2015-07-06 ENCOUNTER — Other Ambulatory Visit: Payer: Self-pay | Admitting: Internal Medicine

## 2015-07-10 ENCOUNTER — Other Ambulatory Visit: Payer: Self-pay | Admitting: Internal Medicine

## 2015-07-15 ENCOUNTER — Encounter: Payer: Self-pay | Admitting: Internal Medicine

## 2015-07-15 MED ORDER — ALBUTEROL SULFATE HFA 108 (90 BASE) MCG/ACT IN AERS
2.0000 | INHALATION_SPRAY | Freq: Four times a day (QID) | RESPIRATORY_TRACT | Status: DC | PRN
Start: 2015-07-15 — End: 2017-09-07

## 2015-07-15 NOTE — Telephone Encounter (Signed)
Refilled albuterol inhaler (#1 with one refill)

## 2015-07-18 ENCOUNTER — Other Ambulatory Visit: Payer: Self-pay | Admitting: Internal Medicine

## 2015-08-05 ENCOUNTER — Other Ambulatory Visit: Payer: Self-pay | Admitting: Internal Medicine

## 2015-08-05 ENCOUNTER — Other Ambulatory Visit (INDEPENDENT_AMBULATORY_CARE_PROVIDER_SITE_OTHER): Payer: BC Managed Care – PPO

## 2015-08-05 DIAGNOSIS — D509 Iron deficiency anemia, unspecified: Secondary | ICD-10-CM

## 2015-08-05 DIAGNOSIS — E7800 Pure hypercholesterolemia, unspecified: Secondary | ICD-10-CM

## 2015-08-05 DIAGNOSIS — E119 Type 2 diabetes mellitus without complications: Secondary | ICD-10-CM

## 2015-08-05 DIAGNOSIS — I10 Essential (primary) hypertension: Secondary | ICD-10-CM

## 2015-08-05 LAB — CBC WITH DIFFERENTIAL/PLATELET
BASOS ABS: 0 10*3/uL (ref 0.0–0.1)
Basophils Relative: 0.4 % (ref 0.0–3.0)
Eosinophils Absolute: 0.1 10*3/uL (ref 0.0–0.7)
Eosinophils Relative: 1.5 % (ref 0.0–5.0)
HEMATOCRIT: 36.5 % (ref 36.0–46.0)
HEMOGLOBIN: 11.7 g/dL — AB (ref 12.0–15.0)
LYMPHS PCT: 24.3 % (ref 12.0–46.0)
Lymphs Abs: 2.4 10*3/uL (ref 0.7–4.0)
MCHC: 32 g/dL (ref 30.0–36.0)
MCV: 80.2 fl (ref 78.0–100.0)
MONOS PCT: 7 % (ref 3.0–12.0)
Monocytes Absolute: 0.7 10*3/uL (ref 0.1–1.0)
NEUTROS PCT: 66.8 % (ref 43.0–77.0)
Neutro Abs: 6.5 10*3/uL (ref 1.4–7.7)
Platelets: 389 10*3/uL (ref 150.0–400.0)
RBC: 4.55 Mil/uL (ref 3.87–5.11)
RDW: 14.9 % (ref 11.5–15.5)
WBC: 9.7 10*3/uL (ref 4.0–10.5)

## 2015-08-05 LAB — LIPID PANEL
Cholesterol: 198 mg/dL (ref 0–200)
HDL: 43.3 mg/dL (ref >39.00)
NONHDL: 154.66
TRIGLYCERIDES: 266 mg/dL — AB (ref 0.0–149.0)
Total CHOL/HDL Ratio: 5
VLDL: 53.2 mg/dL — ABNORMAL HIGH (ref 0.0–40.0)

## 2015-08-05 LAB — LDL CHOLESTEROL, DIRECT: Direct LDL: 120 mg/dL

## 2015-08-05 LAB — FERRITIN: Ferritin: 6.4 ng/mL — ABNORMAL LOW (ref 10.0–291.0)

## 2015-08-05 NOTE — Progress Notes (Signed)
Orders placed for add on labs.  

## 2015-08-08 ENCOUNTER — Other Ambulatory Visit: Payer: Self-pay | Admitting: Internal Medicine

## 2015-08-08 ENCOUNTER — Encounter: Payer: Self-pay | Admitting: Internal Medicine

## 2015-08-10 ENCOUNTER — Telehealth: Payer: Self-pay | Admitting: *Deleted

## 2015-08-10 NOTE — Telephone Encounter (Signed)
Left message to call office

## 2015-08-10 NOTE — Telephone Encounter (Signed)
-----  Message from Einar Pheasant, MD sent at 08/09/2015  1:11 PM EST ----- Regarding: RE: add on lab You can give pt the option.  She can come in before her appt so that we have the labs to discuss or we can draw at the appt.    Thanks   Dr Nicki Reaper ----- Message -----    From: Nanci Pina, LPN    Sent: 12/06/8411   8:01 AM      To: Einar Pheasant, MD Subject: RE: add on lab                                 Dr. Nicki Reaper I was so busy in the lab on Friday,I never had the chance to look back at in box that afternoon. I can call have her come back in? ----- Message -----    From: Einar Pheasant, MD    Sent: 08/05/2015   3:31 PM      To: Nanci Pina, LPN Subject: add on lab                                     Pt also needs a met b, a1c and liver panel added to blood drawn today.  I have placed the orders.  Please add on.  Let me know if a problem.    Happy New Year.  Dr Nicki Reaper

## 2015-08-12 ENCOUNTER — Encounter: Payer: Self-pay | Admitting: Internal Medicine

## 2015-08-12 ENCOUNTER — Ambulatory Visit (INDEPENDENT_AMBULATORY_CARE_PROVIDER_SITE_OTHER): Payer: BC Managed Care – PPO | Admitting: Internal Medicine

## 2015-08-12 VITALS — BP 136/82 | HR 105 | Temp 98.0°F | Resp 18 | Ht 67.0 in | Wt 300.0 lb

## 2015-08-12 DIAGNOSIS — I10 Essential (primary) hypertension: Secondary | ICD-10-CM | POA: Diagnosis not present

## 2015-08-12 DIAGNOSIS — K219 Gastro-esophageal reflux disease without esophagitis: Secondary | ICD-10-CM | POA: Diagnosis not present

## 2015-08-12 DIAGNOSIS — E119 Type 2 diabetes mellitus without complications: Secondary | ICD-10-CM | POA: Diagnosis not present

## 2015-08-12 DIAGNOSIS — E7800 Pure hypercholesterolemia, unspecified: Secondary | ICD-10-CM

## 2015-08-12 DIAGNOSIS — R194 Change in bowel habit: Secondary | ICD-10-CM

## 2015-08-12 DIAGNOSIS — D509 Iron deficiency anemia, unspecified: Secondary | ICD-10-CM

## 2015-08-12 DIAGNOSIS — R198 Other specified symptoms and signs involving the digestive system and abdomen: Secondary | ICD-10-CM

## 2015-08-12 LAB — BASIC METABOLIC PANEL
BUN: 12 mg/dL (ref 6–23)
CHLORIDE: 102 meq/L (ref 96–112)
CO2: 26 meq/L (ref 19–32)
Calcium: 9.4 mg/dL (ref 8.4–10.5)
Creatinine, Ser: 0.48 mg/dL (ref 0.40–1.20)
GFR: 154.64 mL/min (ref >60.00)
GLUCOSE: 123 mg/dL — AB (ref 70–99)
POTASSIUM: 4.3 meq/L (ref 3.5–5.1)
SODIUM: 137 meq/L (ref 135–145)

## 2015-08-12 LAB — HEPATIC FUNCTION PANEL
ALK PHOS: 62 U/L (ref 39–117)
ALT: 11 U/L (ref 0–35)
AST: 10 U/L (ref 0–37)
Albumin: 4.1 g/dL (ref 3.5–5.2)
BILIRUBIN DIRECT: 0 mg/dL (ref 0.0–0.3)
BILIRUBIN TOTAL: 0.3 mg/dL (ref 0.2–1.2)
TOTAL PROTEIN: 6.8 g/dL (ref 6.0–8.3)

## 2015-08-12 LAB — HEMOGLOBIN A1C: Hgb A1c MFr Bld: 6.9 % — ABNORMAL HIGH (ref 4.6–6.5)

## 2015-08-12 NOTE — Progress Notes (Signed)
Pre-visit discussion using our clinic review tool. No additional management support is needed unless otherwise documented below in the visit note.  

## 2015-08-12 NOTE — Progress Notes (Signed)
Patient ID: Lindsey Strickland, female   DOB: [birth year], 40 y.o.   MRN: [id number]   Subjective:    Patient ID: Lindsey Strickland, female    DOB: 01-11-1976, 40 y.o.   MRN: [id number]  HPI  Patient with past history of hypercholesterolemia, diabetes, hypertension and GERD.  She comes in today to follow up on these issues.  She has been eating a lot of carbs - biscuits, bagels and shrimp.  Not exercising.  We discussed diet and exercise.  Reports loose bowels - persistent.  May have 2-3 episodes per day.  This is a change in bowels since 03/2015.  No blood.  Does report some persistent occasional acid reflux.  Will break through occasionally - with her taking zantac.  She also reports having a piercing sharp pain in her abdomen - intermittent.  May occur several times per month.  Occurs center of abdomen to left.  No vomiting.  Breathing stable.     Past Medical History  Diagnosis Date  . Hypertension   . Hypercholesterolemia   . GERD (gastroesophageal reflux disease)   . Environmental allergies   . Diabetes mellitus ([place])    No past surgical history on file. Family History  Problem Relation Age of Onset  . Hypertension Mother   . Diabetes Mother   . Diverticulosis Mother   . Hypercholesterolemia Father   . Hypertension Father   . Heart disease Maternal Grandfather   . Diverticulosis Paternal Grandmother   . Hyperlipidemia Paternal Grandmother   . Heart failure Paternal Grandmother    Social History   Social History  . Marital Status: Single    Spouse Name: N/A  . Number of Children: 2  . Years of Education: N/A   Occupational History  .     Social History Main Topics  . Smoking status: Former Research scientist (life sciences)  . Smokeless tobacco: Never Used  . Alcohol Use: 0.0 oz/week    0 Standard drinks or equivalent per week     Comment: rare  . Drug Use: No  . Sexual Activity: Not Asked   Other Topics Concern  . None   Social History Narrative    Outpatient Encounter Prescriptions as  of 08/12/2015  Medication Sig  . albuterol (PROVENTIL HFA;VENTOLIN HFA) 108 (90 BASE) MCG/ACT inhaler Inhale 2 puffs into the lungs every 6 (six) hours as needed for wheezing.  Marland Kitchen amLODipine (NORVASC) 5 MG tablet TAKE ONE TABLET BY MOUTH TWICE DAILY  . fluticasone (FLONASE) 50 MCG/ACT nasal spray Place 2 sprays into the nose daily.  Marland Kitchen glucose blood test strip 1 each by Other route 2 (two) times daily. Use as instructed, use one strip twice a day  . Lancets MISC by Does not apply route. Use 1 lancets three times a day  . loratadine (ALAVERT) 10 MG tablet Take 10 mg by mouth daily.  Marland Kitchen losartan (COZAAR) 100 MG tablet TAKE ONE TABLET BY MOUTH ONCE DAILY  . metFORMIN (GLUCOPHAGE) 1000 MG tablet TAKE ONE TABLET BY MOUTH TWICE DAILY WITH A MEAL  . metoprolol succinate (TOPROL-XL) 100 MG 24 hr tablet TAKE ONE-HALF TABLET BY MOUTH TWICE DAILY  . pravastatin (PRAVACHOL) 40 MG tablet TAKE ONE TABLET BY MOUTH ONCE DAILY  . Probiotic Product (ALIGN PO) Take by mouth daily.  . ranitidine (ZANTAC) 150 MG tablet Take 150 mg by mouth once. Take 1 tablet by mouth once a day  . [DISCONTINUED] cephALEXin (KEFLEX) 500 MG capsule Take 1 capsule (500 mg total) by mouth 3 (three)  times daily.  . [DISCONTINUED] ciprofloxacin (CIPRO) 500 MG tablet Take 1 tablet (500 mg total) by mouth 2 (two) times daily.  . [DISCONTINUED] guaiFENesin (MUCINEX) 600 MG 12 hr tablet Take 1,200 mg by mouth 2 (two) times daily as needed.   . [DISCONTINUED] metFORMIN (GLUCOPHAGE) 1000 MG tablet TAKE ONE TABLET BY MOUTH TWICE DAILY WITH A MEAL   No facility-administered encounter medications on file as of 08/12/2015.    Review of Systems  Constitutional: Negative for fatigue and unexpected weight change.  HENT: Negative for congestion and sinus pressure.   Eyes: Negative for discharge and redness.  Respiratory: Negative for cough, chest tightness and shortness of breath.   Cardiovascular: Negative for chest pain, palpitations and leg  swelling.  Gastrointestinal: Positive for abdominal pain. Negative for nausea and vomiting.       Bowel change with loose stool as outlined.    Genitourinary: Negative for dysuria and difficulty urinating.  Musculoskeletal: Negative for back pain and joint swelling.  Skin: Negative for color change and rash.  Neurological: Negative for dizziness, light-headedness and headaches.  Psychiatric/Behavioral: Negative for dysphoric mood and agitation.       Objective:     Blood pressure rechecked by me:  136/82  Physical Exam  Constitutional: She appears well-developed and well-nourished. No distress.  HENT:  Nose: Nose normal.  Mouth/Throat: Oropharynx is clear and moist.  Eyes: Conjunctivae are normal. Right eye exhibits no discharge. Left eye exhibits no discharge.  Neck: Neck supple. No thyromegaly present.  Cardiovascular: Normal rate and regular rhythm.   Pulmonary/Chest: Breath sounds normal. No respiratory distress. She has no wheezes.  Abdominal: Soft. Bowel sounds are normal. There is no tenderness.  Musculoskeletal: She exhibits no edema or tenderness.  Lymphadenopathy:    She has no cervical adenopathy.  Skin: No rash noted. No erythema.  Psychiatric: She has a normal mood and affect. Her behavior is normal.    BP 136/82 mmHg  Pulse 105  Temp(Src) 98 F (36.7 C) (Oral)  Resp 18  Ht _0  (1.702 m)  Wt 300 lb (136.079 kg)  BMI 46.98 kg/m2  SpO2 100% Wt Readings from Last 3 Encounters:  08/12/15 300 lb (136.079 kg)  02/10/15 294 lb 8 oz (133.584 kg)  08/05/14 299 lb 8 oz (135.852 kg)     Lab Results  Component Value Date   WBC 9.7 08/05/2015   HGB 11.7* 08/05/2015   HCT 36.5 08/05/2015   PLT 389.0 08/05/2015   GLUCOSE 123* 08/12/2015   CHOL 198 08/05/2015   TRIG 266.0* 08/05/2015   HDL 43.30 08/05/2015   LDLDIRECT 120.0 08/05/2015   LDLCALC 89 08/18/2014   ALT 11 08/12/2015   AST 10 08/12/2015   NA 137 08/12/2015   K 4.3 08/12/2015   CL 102  08/12/2015   CREATININE 0.48 08/12/2015   BUN 12 08/12/2015   CO2 26 08/12/2015   TSH 3.06 02/10/2015   HGBA1C 6.9* 08/12/2015   MICROALBUR 1.0 02/10/2015    Dg Chest 2 View  07/03/2013  CLINICAL DATA:  Intermittent dyspnea EXAM: CHEST  2 VIEW COMPARISON:  None. FINDINGS: The lungs are adequately inflated and clear. There is no pneumothorax or pleural effusion or pneumomediastinum. The cardiac silhouette is normal in size. The pulmonary vascularity is not engorged. The mediastinum is normal in width. The observed portions of the bony thorax are normal. IMPRESSION: There is no evidence of active cardiopulmonary disease. Electronically Signed   By: David  Martinique   On: 07/03/2013 11:07  Assessment & Plan:   Problem List Items Addressed This Visit    Change in bowel function    Persistent loose stool as outlined.  Also persistent intermittent abdominal pain as outlined.  Given the bowel change, etc, will refer to GI for evaluation and question of need for colonoscopy and possible EGD (as outlined above).  Probiotic.  Discussed CT.  Wants to hold until GI evaluation.        Relevant Orders   Ambulatory referral to Gastroenterology   Diabetes mellitus ([place]) - Primary    On metformin.  Brought in no recorded sugar readings.  Has been eating an increased amount of carbs.  Low carb diet and exercise.  Discussed with her today at length.  Follow met b and a1c.        Relevant Orders   Basic metabolic panel   Hemoglobin A1c   GERD (gastroesophageal reflux disease)    On zantac.  Still with occasional break through on zantac, but overall ok.  Follow.  Refer to GI for bowel issues.  Question of need for EGD - since persistent need for zantac.         Relevant Orders   Ambulatory referral to Gastroenterology   Hypercholesterolemia    On pravastatin.  Low cholesterol diet and exercise.  Follow lipid panel and liver function tests.  LDL improved 120.  Triglycerides increased.         Relevant Orders   Lipid panel   Hepatic function panel   Hypertension    Blood pressure as outlined.  Recheck improved.  Follow pressures.  Same medication regimen.  Discussed diet and exercise.        Iron deficiency anemia    hgb just checked 11.7.  Low ferritin.  With bowel changes and anemia (iron deficient), refer to GI as outlined.        Relevant Orders   Ambulatory referral to Gastroenterology   CBC with Differential/Platelet   Ferritin   Severe obesity (BMI >= 40) ([facility])    Low carb diet and exercise.  Discussed with her today.  Follow.            Einar Pheasant, MD

## 2015-08-13 ENCOUNTER — Encounter: Payer: Self-pay | Admitting: Internal Medicine

## 2015-08-14 ENCOUNTER — Encounter: Payer: Self-pay | Admitting: Internal Medicine

## 2015-08-14 DIAGNOSIS — R198 Other specified symptoms and signs involving the digestive system and abdomen: Secondary | ICD-10-CM | POA: Insufficient documentation

## 2015-08-14 DIAGNOSIS — D509 Iron deficiency anemia, unspecified: Secondary | ICD-10-CM | POA: Insufficient documentation

## 2015-08-14 NOTE — Assessment & Plan Note (Signed)
On pravastatin.  Low cholesterol diet and exercise.  Follow lipid panel and liver function tests.  LDL improved 120.  Triglycerides increased.

## 2015-08-14 NOTE — Assessment & Plan Note (Signed)
Persistent loose stool as outlined.  Also persistent intermittent abdominal pain as outlined.  Given the bowel change, etc, will refer to GI for evaluation and question of need for colonoscopy and possible EGD (as outlined above).  Probiotic.  Discussed CT.  Wants to hold until GI evaluation.

## 2015-08-14 NOTE — Assessment & Plan Note (Signed)
hgb just checked 11.7.  Low ferritin.  With bowel changes and anemia (iron deficient), refer to GI as outlined.

## 2015-08-14 NOTE — Assessment & Plan Note (Signed)
Low carb diet and exercise.  Discussed with her today.  Follow.

## 2015-08-14 NOTE — Assessment & Plan Note (Signed)
On metformin.  Brought in no recorded sugar readings.  Has been eating an increased amount of carbs.  Low carb diet and exercise.  Discussed with her today at length.  Follow met b and a1c.

## 2015-08-14 NOTE — Addendum Note (Signed)
Addended by: Alisa Graff on: 08/14/2015 06:05 PM   Modules accepted: Orders

## 2015-08-14 NOTE — Assessment & Plan Note (Addendum)
On zantac.  Still with occasional break through on zantac, but overall ok.  Follow.  Refer to GI for bowel issues.  Question of need for EGD - since persistent need for zantac.

## 2015-08-14 NOTE — Assessment & Plan Note (Signed)
Blood pressure as outlined.  Recheck improved.  Follow pressures.  Same medication regimen.  Discussed diet and exercise.

## 2015-08-18 ENCOUNTER — Other Ambulatory Visit: Payer: Self-pay | Admitting: Internal Medicine

## 2015-09-07 ENCOUNTER — Other Ambulatory Visit: Payer: Self-pay | Admitting: Internal Medicine

## 2015-09-08 NOTE — Telephone Encounter (Signed)
Toprol and Pravachol filled

## 2015-09-19 ENCOUNTER — Other Ambulatory Visit: Payer: Self-pay | Admitting: Internal Medicine

## 2015-09-27 ENCOUNTER — Telehealth: Payer: Self-pay | Admitting: Internal Medicine

## 2015-09-27 NOTE — Telephone Encounter (Signed)
Pt needs orders before her appt on 12/15/15 @11am . Please and thank you! Call pt @ [phone].

## 2015-09-27 NOTE — Telephone Encounter (Signed)
Lab appt made

## 2015-10-06 ENCOUNTER — Other Ambulatory Visit: Payer: Self-pay | Admitting: Internal Medicine

## 2015-11-04 ENCOUNTER — Other Ambulatory Visit: Payer: Self-pay | Admitting: Internal Medicine

## 2015-11-18 ENCOUNTER — Ambulatory Visit: Payer: BC Managed Care – PPO | Admitting: Internal Medicine

## 2015-11-27 ENCOUNTER — Other Ambulatory Visit: Payer: Self-pay | Admitting: Internal Medicine

## 2015-12-03 ENCOUNTER — Other Ambulatory Visit: Payer: Self-pay | Admitting: Internal Medicine

## 2015-12-05 ENCOUNTER — Encounter: Payer: Self-pay | Admitting: Internal Medicine

## 2015-12-05 NOTE — Telephone Encounter (Signed)
Please call pt and have her go ahead and be seen this pm and then we can f/u after.  Need to make sure what is going on.  Thanks

## 2015-12-06 ENCOUNTER — Ambulatory Visit: Payer: BC Managed Care – PPO

## 2015-12-06 ENCOUNTER — Other Ambulatory Visit: Payer: Self-pay | Admitting: Family Medicine

## 2015-12-06 DIAGNOSIS — R1032 Left lower quadrant pain: Secondary | ICD-10-CM

## 2015-12-07 ENCOUNTER — Other Ambulatory Visit: Payer: Self-pay

## 2015-12-07 ENCOUNTER — Ambulatory Visit
Admission: RE | Admit: 2015-12-07 | Discharge: 2015-12-07 | Disposition: A | Discharge status: Home / Self Care | Payer: BC Managed Care – PPO | Source: Ambulatory Visit | Attending: Family Medicine | Admitting: Family Medicine

## 2015-12-07 DIAGNOSIS — R1032 Left lower quadrant pain: Secondary | ICD-10-CM | POA: Diagnosis not present

## 2015-12-07 DIAGNOSIS — K5730 Diverticulosis of large intestine without perforation or abscess without bleeding: Secondary | ICD-10-CM | POA: Insufficient documentation

## 2015-12-07 DIAGNOSIS — N8300 Follicular cyst of ovary, unspecified side: Secondary | ICD-10-CM | POA: Insufficient documentation

## 2015-12-07 DIAGNOSIS — M5137 Other intervertebral disc degeneration, lumbosacral region: Secondary | ICD-10-CM | POA: Insufficient documentation

## 2015-12-07 MED ORDER — PRAVASTATIN SODIUM 40 MG PO TABS: 40.0000 mg | ORAL_TABLET | Freq: Every day | ORAL | Status: DC

## 2015-12-15 ENCOUNTER — Ambulatory Visit (INDEPENDENT_AMBULATORY_CARE_PROVIDER_SITE_OTHER): Payer: BC Managed Care – PPO | Admitting: Internal Medicine

## 2015-12-15 ENCOUNTER — Other Ambulatory Visit (INDEPENDENT_AMBULATORY_CARE_PROVIDER_SITE_OTHER): Payer: BC Managed Care – PPO

## 2015-12-15 ENCOUNTER — Encounter: Payer: Self-pay | Admitting: Internal Medicine

## 2015-12-15 VITALS — BP 124/80 | HR 92 | Temp 98.4°F | Resp 18 | Ht 67.0 in | Wt 301.1 lb

## 2015-12-15 DIAGNOSIS — N920 Excessive and frequent menstruation with regular cycle: Secondary | ICD-10-CM | POA: Diagnosis not present

## 2015-12-15 DIAGNOSIS — D509 Iron deficiency anemia, unspecified: Secondary | ICD-10-CM | POA: Diagnosis not present

## 2015-12-15 DIAGNOSIS — E7800 Pure hypercholesterolemia, unspecified: Secondary | ICD-10-CM | POA: Diagnosis not present

## 2015-12-15 DIAGNOSIS — E119 Type 2 diabetes mellitus without complications: Secondary | ICD-10-CM

## 2015-12-15 DIAGNOSIS — R002 Palpitations: Secondary | ICD-10-CM | POA: Diagnosis not present

## 2015-12-15 DIAGNOSIS — R198 Other specified symptoms and signs involving the digestive system and abdomen: Secondary | ICD-10-CM

## 2015-12-15 DIAGNOSIS — I10 Essential (primary) hypertension: Secondary | ICD-10-CM

## 2015-12-15 DIAGNOSIS — K219 Gastro-esophageal reflux disease without esophagitis: Secondary | ICD-10-CM

## 2015-12-15 DIAGNOSIS — R194 Change in bowel habit: Secondary | ICD-10-CM

## 2015-12-15 LAB — LIPID PANEL
CHOL/HDL RATIO: 5
Cholesterol: 180 mg/dL (ref 0–200)
HDL: 33.8 mg/dL — AB (ref >39.00)
LDL CALC: 114 mg/dL — AB (ref 0–99)
NonHDL: 145.83
Triglycerides: 160 mg/dL — ABNORMAL HIGH (ref 0.0–149.0)
VLDL: 32 mg/dL (ref 0.0–40.0)

## 2015-12-15 LAB — BASIC METABOLIC PANEL
BUN: 12 mg/dL (ref 6–23)
CHLORIDE: 103 meq/L (ref 96–112)
CO2: 25 meq/L (ref 19–32)
CREATININE: 0.47 mg/dL (ref 0.40–1.20)
Calcium: 9.2 mg/dL (ref 8.4–10.5)
GFR: 158.15 mL/min (ref >60.00)
Glucose, Bld: 144 mg/dL — ABNORMAL HIGH (ref 70–99)
POTASSIUM: 4.3 meq/L (ref 3.5–5.1)
Sodium: 138 mEq/L (ref 135–145)

## 2015-12-15 LAB — FERRITIN: FERRITIN: 9.8 ng/mL — AB (ref 10.0–291.0)

## 2015-12-15 LAB — CBC WITH DIFFERENTIAL/PLATELET
BASOS PCT: 0.5 % (ref 0.0–3.0)
Basophils Absolute: 0 10*3/uL (ref 0.0–0.1)
EOS PCT: 1.4 % (ref 0.0–5.0)
Eosinophils Absolute: 0.1 10*3/uL (ref 0.0–0.7)
HCT: 35.7 % — ABNORMAL LOW (ref 36.0–46.0)
Hemoglobin: 11.7 g/dL — ABNORMAL LOW (ref 12.0–15.0)
Lymphocytes Relative: 25 % (ref 12.0–46.0)
Lymphs Abs: 2.4 10*3/uL (ref 0.7–4.0)
MCHC: 32.8 g/dL (ref 30.0–36.0)
MCV: 79.9 fl (ref 78.0–100.0)
MONO ABS: 0.7 10*3/uL (ref 0.1–1.0)
MONOS PCT: 7.1 % (ref 3.0–12.0)
NEUTROS PCT: 66 % (ref 43.0–77.0)
Neutro Abs: 6.4 10*3/uL (ref 1.4–7.7)
PLATELETS: 429 10*3/uL — AB (ref 150.0–400.0)
RBC: 4.47 Mil/uL (ref 3.87–5.11)
RDW: 15.7 % — ABNORMAL HIGH (ref 11.5–15.5)
WBC: 9.6 10*3/uL (ref 4.0–10.5)

## 2015-12-15 LAB — HEPATIC FUNCTION PANEL
ALBUMIN: 4.1 g/dL (ref 3.5–5.2)
ALK PHOS: 53 U/L (ref 39–117)
ALT: 13 U/L (ref 0–35)
AST: 12 U/L (ref 0–37)
Bilirubin, Direct: 0 mg/dL (ref 0.0–0.3)
Total Bilirubin: 0.3 mg/dL (ref 0.2–1.2)
Total Protein: 7 g/dL (ref 6.0–8.3)

## 2015-12-15 LAB — HEMOGLOBIN A1C: HEMOGLOBIN A1C: 6.8 % — AB (ref 4.6–6.5)

## 2015-12-15 NOTE — Progress Notes (Signed)
Pre-visit discussion using our clinic review tool. No additional management support is needed unless otherwise documented below in the visit note.  

## 2015-12-15 NOTE — Progress Notes (Signed)
Patient ID: Lindsey Strickland, female   DOB: October 11, 1975, 40 y.o.   MRN: [id number]   Subjective:    Patient ID: Lindsey Strickland, female    DOB: 1976/01/11, 40 y.o.   MRN: [id number]  HPI  Patient here for a scheduled follow up.  She is planning to have a hysteroscopy 01/26/16.  Has a history of heavy bleeding.  Discussed diet and exercise.  She has not been checking her sugars.  Not exercising regularly.  She does report intermittent palpitations.  Increased fatigue.  Occasionally notices some discomfort.  No increased cough.  No acid reflux.  No abdominal pain or cramping.     Past Medical History  Diagnosis Date  . Hypertension   . Hypercholesterolemia   . GERD (gastroesophageal reflux disease)   . Environmental allergies   . Diabetes mellitus ([place])    No past surgical history on file. Family History  Problem Relation Age of Onset  . Hypertension Mother   . Diabetes Mother   . Diverticulosis Mother   . Hypercholesterolemia Father   . Hypertension Father   . Heart disease Maternal Grandfather   . Diverticulosis Paternal Grandmother   . Hyperlipidemia Paternal Grandmother   . Heart failure Paternal Grandmother    Social History   Social History  . Marital Status: Single    Spouse Name: N/A  . Number of Children: 2  . Years of Education: N/A   Occupational History  .     Social History Main Topics  . Smoking status: Former Research scientist (life sciences)  . Smokeless tobacco: Never Used  . Alcohol Use: 0.0 oz/week    0 Standard drinks or equivalent per week     Comment: rare  . Drug Use: No  . Sexual Activity: Not Asked   Other Topics Concern  . None   Social History Narrative    Outpatient Encounter Prescriptions as of 12/15/2015  Medication Sig  . albuterol (PROVENTIL HFA;VENTOLIN HFA) 108 (90 BASE) MCG/ACT inhaler Inhale 2 puffs into the lungs every 6 (six) hours as needed for wheezing.  Marland Kitchen amLODipine (NORVASC) 5 MG tablet TAKE ONE TABLET BY MOUTH TWICE DAILY  . [EXPIRED]  amoxicillin-clavulanate (AUGMENTIN) 875-125 MG tablet   . fluticasone (FLONASE) 50 MCG/ACT nasal spray Place 2 sprays into the nose daily.  Marland Kitchen glucose blood test strip 1 each by Other route 2 (two) times daily. Use as instructed, use one strip twice a day  . Lancets MISC by Does not apply route. Use 1 lancets three times a day  . loratadine (ALAVERT) 10 MG tablet Take 10 mg by mouth daily.  Marland Kitchen losartan (COZAAR) 100 MG tablet Take 1 tablet (100 mg total) by mouth daily.  . metoprolol succinate (TOPROL-XL) 100 MG 24 hr tablet TAKE ONE-HALF TABLET BY MOUTH TWICE DAILY  . pravastatin (PRAVACHOL) 40 MG tablet Take 1 tablet (40 mg total) by mouth daily.  . Probiotic Product (ALIGN PO) Take by mouth daily.  . ranitidine (ZANTAC) 150 MG tablet Take 150 mg by mouth once. Take 1 tablet by mouth once a day  . [DISCONTINUED] losartan (COZAAR) 100 MG tablet TAKE ONE TABLET BY MOUTH ONCE DAILY  . [DISCONTINUED] metFORMIN (GLUCOPHAGE) 1000 MG tablet TAKE ONE TABLET BY MOUTH TWICE DAILY WITH A MEAL  . [DISCONTINUED] pravastatin (PRAVACHOL) 40 MG tablet Take 1 tablet (40 mg total) by mouth daily.  . metFORMIN (GLUMETZA) 1000 MG (MOD) 24 hr tablet Take 1 tablet (1,000 mg total) by mouth 2 (two) times daily with a meal.  No facility-administered encounter medications on file as of 12/15/2015.    Review of Systems  Constitutional: Positive for fatigue. Negative for appetite change.  HENT: Negative for congestion and sinus pressure.   Respiratory: Positive for chest tightness. Negative for cough and shortness of breath.   Cardiovascular: Positive for palpitations. Negative for chest pain and leg swelling.  Gastrointestinal: Negative for nausea, vomiting, abdominal pain and diarrhea.  Genitourinary: Negative for dysuria and difficulty urinating.  Musculoskeletal: Negative for back pain and joint swelling.  Skin: Negative for color change and rash.  Neurological: Negative for dizziness, light-headedness and  headaches.  Psychiatric/Behavioral: Negative for agitation.       Increased stress.         Objective:    Physical Exam  Constitutional: She appears well-developed and well-nourished. No distress.  HENT:  Nose: Nose normal.  Mouth/Throat: Oropharynx is clear and moist.  Neck: Neck supple. No thyromegaly present.  Cardiovascular: Normal rate and regular rhythm.   Pulmonary/Chest: Breath sounds normal. No respiratory distress. She has no wheezes.  Abdominal: Soft. Bowel sounds are normal. There is no tenderness.  Musculoskeletal: She exhibits no edema or tenderness.  Lymphadenopathy:    She has no cervical adenopathy.  Skin: No rash noted. No erythema.  Psychiatric: She has a normal mood and affect. Her behavior is normal.    BP 124/80 mmHg  Pulse 92  Temp(Src) 98.4 F (36.9 C) (Oral)  Resp 18  Ht 5' 7" (1.702 m)  Wt 301 lb 2 oz (136.589 kg)  BMI 47.15 kg/m2  SpO2 95%  LMP 11/14/2015 Wt Readings from Last 3 Encounters:  12/15/15 301 lb 2 oz (136.589 kg)  08/12/15 300 lb (136.079 kg)  02/10/15 294 lb 8 oz (133.584 kg)     Lab Results  Component Value Date   WBC 9.6 12/15/2015   HGB 11.7* 12/15/2015   HCT 35.7* 12/15/2015   PLT 429.0* 12/15/2015   GLUCOSE 144* 12/15/2015   CHOL 180 12/15/2015   TRIG 160.0* 12/15/2015   HDL 33.80* 12/15/2015   LDLDIRECT 120.0 08/05/2015   LDLCALC 114* 12/15/2015   ALT 13 12/15/2015   AST 12 12/15/2015   NA 138 12/15/2015   K 4.3 12/15/2015   CL 103 12/15/2015   CREATININE 0.47 12/15/2015   BUN 12 12/15/2015   CO2 25 12/15/2015   TSH 3.06 02/10/2015   HGBA1C 6.8* 12/15/2015   MICROALBUR 1.0 02/10/2015    Ct Abdomen Pelvis Wo Contrast  12/07/2015  ADDENDUM REPORT: 12/07/2015 15:44 ADDENDUM: An additional evaluation of the soft tissues of the right upper quadrant was requested by the referring physician's office. There is a large amount of adiposity present throughout the abdominal wall. However, no abnormality within the  soft tissues of the right upper quadrant in particular is seen. The abdominal wall musculature is intact. Electronically Signed   By: Paul  Barry M.D.   On: 12/07/2015 15:44  12/07/2015  CLINICAL DATA:  Left lower quadrant pain, diarrhea for 5 days EXAM: CT ABDOMEN AND PELVIS WITHOUT CONTRAST TECHNIQUE: Multidetector CT imaging of the abdomen and pelvis was performed following the standard protocol without IV contrast. COMPARISON:  CT abdomen and pelvis of 02/04/2006 FINDINGS: The lung bases are clear. The liver is unremarkable in the unenhanced state. No calcified gallstones are seen. The pancreas is normal in size and the pancreatic duct is not dilated. The adrenal glands and spleen are unremarkable. The stomach is moderately distended with contrast with no abnormality noted. No renal calculi are seen   and there is no evidence of hydronephrosis. The abdominal aorta is normal in caliber. The distal ureters appear to be normal in caliber. No distal ureteral calculus is seen. The urinary bladder is decompressed and cannot be well evaluated. The uterus remains slightly prominent. Probable small ovarian follicles are noted bilaterally. The left ovary is slightly more prominent than the right. The left ovary measures 4.5 x 3.0 cm with the right ovary measuring 4.0 x 2.8 cm. If further assessment for ovaries is warranted ultrasound of the pelvis would be recommended. There is anterolisthesis of L5 on S1 by 5 mm, and bilateral pars defects are noted at L5. There is degenerative disc disease at the L5-S1 level. No free fluid is seen within the pelvis. There are a few rectosigmoid and distal descending colon diverticula present but no diverticulitis is noted. The terminal ileum and the appendix are unremarkable. IMPRESSION: 1. No explanation for the patient's left lower quadrant pain is seen. Although diverticula are present, no diverticulitis is noted. 2. Bilateral pars defects with 5 mm anterolisthesis of L5 on S1 and  degenerative disc disease at L5-S1. 3. No renal calculi are seen. 4. Probable ovarian follicles with the left ovary slightly larger than the right. Consider pelvic ultrasound if warranted clinically. Electronically Signed: By: Ivar Drape M.D. On: 12/07/2015 14:32       Assessment & Plan:   Problem List Items Addressed This Visit    Change in bowel function    Diarrhea better since being on iron.  Follow.        Diabetes mellitus ([place])    On metformin.  Low carb diet and exercise.  Follow met b and a1c.       Relevant Medications   metFORMIN (GLUMETZA) 1000 MG (MOD) 24 hr tablet   losartan (COZAAR) 100 MG tablet   pravastatin (PRAVACHOL) 40 MG tablet   GERD (gastroesophageal reflux disease)    On zantac.  Feels upper symptoms controlled.       Hypercholesterolemia    On pravastatin.  Low cholesterol diet and exercise.  Follow lipid panel and liver function tests.       Relevant Medications   losartan (COZAAR) 100 MG tablet   pravastatin (PRAVACHOL) 40 MG tablet   Hypertension    Blood pressure under good control.  Continue same medication regimen.  Follow pressures.  Follow metabolic panel.        Relevant Medications   losartan (COZAAR) 100 MG tablet   pravastatin (PRAVACHOL) 40 MG tablet   Iron deficiency anemia    On iron.  Follow cbc and ferritin.       Menorrhagia    Increased bleeding.  Planning for hysteroscopy 01/26/16.        Palpitations - Primary    Palpitations as outlined.  Some discomfort.  EKG obtained and revealed SR with no acute ischemic changes.  Discussed with her my desire to have further cardiac testing prior to her procedure.  She wants to check with pre op through [facility] and then have any testing done at [facility].  Wants me to hold on scheduling.  Will need close intra op and post op monitoring of heart rate and blood pressure to avoid extremes.        Relevant Orders   EKG 12-Lead (Completed)   Severe obesity (BMI >= 40) (HCC)    Discussed diet and  exercise.  Follow.       Relevant Medications   metFORMIN (GLUMETZA) 1000 MG (MOD) 24 hr  tablet       SCOTT, CHARLENE, MD  

## 2015-12-18 ENCOUNTER — Encounter: Payer: Self-pay | Admitting: Internal Medicine

## 2015-12-18 DIAGNOSIS — R002 Palpitations: Secondary | ICD-10-CM | POA: Insufficient documentation

## 2015-12-18 MED ORDER — METFORMIN HCL ER (MOD) 1000 MG PO TB24: 1000.0000 mg | ORAL_TABLET | Freq: Two times a day (BID) | ORAL | Status: DC

## 2015-12-18 MED ORDER — LOSARTAN POTASSIUM 100 MG PO TABS: 100.0000 mg | ORAL_TABLET | Freq: Every day | ORAL | Status: DC

## 2015-12-18 MED ORDER — PRAVASTATIN SODIUM 40 MG PO TABS: 40.0000 mg | ORAL_TABLET | Freq: Every day | ORAL | Status: DC

## 2015-12-18 NOTE — Assessment & Plan Note (Signed)
Diarrhea better since being on iron.  Follow.

## 2015-12-18 NOTE — Assessment & Plan Note (Signed)
Discussed diet and exercise.  Follow.  

## 2015-12-18 NOTE — Assessment & Plan Note (Signed)
On pravastatin.  Low cholesterol diet and exercise.  Follow lipid panel and liver function tests.   

## 2015-12-18 NOTE — Assessment & Plan Note (Signed)
Blood pressure under good control.  Continue same medication regimen.  Follow pressures.  Follow metabolic panel.   

## 2015-12-18 NOTE — Assessment & Plan Note (Signed)
On iron.  Follow cbc and ferritin.  

## 2015-12-18 NOTE — Assessment & Plan Note (Signed)
On zantac.  Feels upper symptoms controlled.

## 2015-12-18 NOTE — Assessment & Plan Note (Signed)
Increased bleeding.  Planning for hysteroscopy 01/26/16.

## 2015-12-18 NOTE — Assessment & Plan Note (Signed)
Palpitations as outlined.  Some discomfort.  EKG obtained and revealed SR with no acute ischemic changes.  Discussed with her my desire to have further cardiac testing prior to her procedure.  She wants to check with pre op through [facility] and then have any testing done at [facility].  Wants me to hold on scheduling.  Will need close intra op and post op monitoring of heart rate and blood pressure to avoid extremes.

## 2015-12-18 NOTE — Assessment & Plan Note (Signed)
On metformin.  Low carb diet and exercise.  Follow met b and a1c.   

## 2015-12-21 ENCOUNTER — Telehealth: Payer: Self-pay

## 2015-12-21 NOTE — Telephone Encounter (Signed)
Pharmacy sent a message that the patient's metformin was changed on 5/11 from plain metformin 1000mg  #180 BID , but was changed to Meformin 1000mg  24hour tablet.  This is a high dollar medication #180 will cost [phone].  Please advise if they can use plain metformin or Metformin ER 500 #360 2 BID.  Thanks

## 2015-12-21 NOTE — Telephone Encounter (Signed)
Spoke with Juanda Crumble at the [facility] and changed prescription.

## 2015-12-21 NOTE — Telephone Encounter (Signed)
Notify - ok to change to metformin ER 500mg  - two bid

## 2016-01-05 ENCOUNTER — Encounter: Payer: Self-pay | Admitting: Emergency Medicine

## 2016-01-05 ENCOUNTER — Emergency Department: Payer: BC Managed Care – PPO

## 2016-01-05 ENCOUNTER — Emergency Department
Admission: EM | Admit: 2016-01-05 | Discharge: 2016-01-05 | Disposition: A | Discharge status: Home / Self Care | Payer: BC Managed Care – PPO | Attending: Emergency Medicine | Admitting: Emergency Medicine

## 2016-01-05 DIAGNOSIS — Z7984 Long term (current) use of oral hypoglycemic drugs: Secondary | ICD-10-CM | POA: Diagnosis not present

## 2016-01-05 DIAGNOSIS — E119 Type 2 diabetes mellitus without complications: Secondary | ICD-10-CM | POA: Insufficient documentation

## 2016-01-05 DIAGNOSIS — Z87891 Personal history of nicotine dependence: Secondary | ICD-10-CM | POA: Diagnosis not present

## 2016-01-05 DIAGNOSIS — I10 Essential (primary) hypertension: Secondary | ICD-10-CM | POA: Insufficient documentation

## 2016-01-05 DIAGNOSIS — R079 Chest pain, unspecified: Secondary | ICD-10-CM | POA: Insufficient documentation

## 2016-01-05 DIAGNOSIS — Z79899 Other long term (current) drug therapy: Secondary | ICD-10-CM | POA: Diagnosis not present

## 2016-01-05 LAB — COMPREHENSIVE METABOLIC PANEL
ALBUMIN: 4.4 g/dL (ref 3.5–5.0)
ALK PHOS: 68 U/L (ref 38–126)
ALT: 16 U/L (ref 14–54)
AST: 25 U/L (ref 15–41)
Anion gap: 10 (ref 5–15)
BILIRUBIN TOTAL: 0.7 mg/dL (ref 0.3–1.2)
BUN: 15 mg/dL (ref 6–20)
CALCIUM: 9.5 mg/dL (ref 8.9–10.3)
CO2: 22 mmol/L (ref 22–32)
CREATININE: 0.61 mg/dL (ref 0.44–1.00)
Chloride: 103 mmol/L (ref 101–111)
GFR calc non Af Amer: >60 mL/min (ref >60)
Glucose, Bld: 150 mg/dL — ABNORMAL HIGH (ref 65–99)
Potassium: 4.8 mmol/L (ref 3.5–5.1)
SODIUM: 135 mmol/L (ref 135–145)
TOTAL PROTEIN: 7.9 g/dL (ref 6.5–8.1)

## 2016-01-05 LAB — URINALYSIS COMPLETE WITH MICROSCOPIC (ARMC ONLY)
BILIRUBIN URINE: NEGATIVE
Glucose, UA: NEGATIVE mg/dL
KETONES UR: NEGATIVE mg/dL
LEUKOCYTES UA: NEGATIVE
NITRITE: NEGATIVE
PH: 6 (ref 5.0–8.0)
Protein, ur: NEGATIVE mg/dL
Specific Gravity, Urine: 1.015 (ref 1.005–1.030)

## 2016-01-05 LAB — CBC
HEMATOCRIT: 37.8 % (ref 35.0–47.0)
HEMOGLOBIN: 12.3 g/dL (ref 12.0–16.0)
MCH: 26.2 pg (ref 26.0–34.0)
MCHC: 32.6 g/dL (ref 32.0–36.0)
MCV: 80.2 fL (ref 80.0–100.0)
Platelets: 399 10*3/uL (ref 150–440)
RBC: 4.71 MIL/uL (ref 3.80–5.20)
RDW: 15.7 % — ABNORMAL HIGH (ref 11.5–14.5)
WBC: 11.8 10*3/uL — ABNORMAL HIGH (ref 3.6–11.0)

## 2016-01-05 LAB — FIBRIN DERIVATIVES D-DIMER (ARMC ONLY): Fibrin derivatives D-dimer (ARMC): 389 (ref 0–499)

## 2016-01-05 LAB — TROPONIN I: Troponin I: <0.03 ng/mL (ref <0.031)

## 2016-01-05 LAB — POCT PREGNANCY, URINE: PREG TEST UR: NEGATIVE

## 2016-01-05 MED ORDER — GI COCKTAIL ~~LOC~~
30.0000 mL | Freq: Once | ORAL | Status: AC
  Administered 2016-01-05: 30 mL via ORAL
  Filled 2016-01-05: qty 30

## 2016-01-05 NOTE — ED Notes (Signed)
AAOx3.  Skin warm and dry. Ambulates with easy and steady gait. 

## 2016-01-05 NOTE — Discharge Instructions (Signed)
Nonspecific Chest Pain  °Chest pain can be caused by many different conditions. There is always a chance that your pain could be related to something serious, such as a heart attack or a blood clot in your lungs. Chest pain can also be caused by conditions that are not life-threatening. If you have chest pain, it is very important to follow up with your health care provider. °CAUSES  °Chest pain can be caused by: °· Heartburn. °· Pneumonia or bronchitis. °· Anxiety or stress. °· Inflammation around your heart (pericarditis) or lung (pleuritis or pleurisy). °· A blood clot in your lung. °· A collapsed lung (pneumothorax). It can develop suddenly on its own (spontaneous pneumothorax) or from trauma to the chest. °· Shingles infection (varicella-zoster virus). °· Heart attack. °· Damage to the bones, muscles, and cartilage that make up your chest wall. This can include: °¨ Bruised bones due to injury. °¨ Strained muscles or cartilage due to frequent or repeated coughing or overwork. °¨ Fracture to one or more ribs. °¨ Sore cartilage due to inflammation (costochondritis). °RISK FACTORS  °Risk factors for chest pain may include: °· Activities that increase your risk for trauma or injury to your chest. °· Respiratory infections or conditions that cause frequent coughing. °· Medical conditions or overeating that can cause heartburn. °· Heart disease or family history of heart disease. °· Conditions or health behaviors that increase your risk of developing a blood clot. °· Having had chicken pox (varicella zoster). °SIGNS AND SYMPTOMS °Chest pain can feel like: °· Burning or tingling on the surface of your chest or deep in your chest. °· Crushing, pressure, aching, or squeezing pain. °· Dull or sharp pain that is worse when you move, cough, or take a deep breath. °· Pain that is also felt in your back, neck, shoulder, or arm, or pain that spreads to any of these areas. °Your chest pain may come and go, or it may stay  constant. °DIAGNOSIS °Lab tests or other studies may be needed to find the cause of your pain. Your health care provider may have you take a test called an ambulatory ECG (electrocardiogram). An ECG records your heartbeat patterns at the time the test is performed. You may also have other tests, such as: °· Transthoracic echocardiogram (TTE). During echocardiography, sound waves are used to create a picture of all of the heart structures and to look at how blood flows through your heart. °· Transesophageal echocardiogram (TEE). This is a more advanced imaging test that obtains images from inside your body. It allows your health care provider to see your heart in finer detail. °· Cardiac monitoring. This allows your health care provider to monitor your heart rate and rhythm in real time. °· Holter monitor. This is a portable device that records your heartbeat and can help to diagnose abnormal heartbeats. It allows your health care provider to track your heart activity for several days, if needed. °· Stress tests. These can be done through exercise or by taking medicine that makes your heart beat more quickly. °· Blood tests. °· Imaging tests. °TREATMENT  °Your treatment depends on what is causing your chest pain. Treatment may include: °· Medicines. These may include: °¨ Acid blockers for heartburn. °¨ Anti-inflammatory medicine. °¨ Pain medicine for inflammatory conditions. °¨ Antibiotic medicine, if an infection is present. °¨ Medicines to dissolve blood clots. °¨ Medicines to treat coronary artery disease. °· Supportive care for conditions that do not require medicines. This may include: °¨ Resting. °¨ Applying heat   or cold packs to injured areas. °¨ Limiting activities until pain decreases. °HOME CARE INSTRUCTIONS °· If you were prescribed an antibiotic medicine, finish it all even if you start to feel better. °· Avoid any activities that bring on chest pain. °· Do not use any tobacco products, including  cigarettes, chewing tobacco, or electronic cigarettes. If you need help quitting, ask your health care provider. °· Do not drink alcohol. °· Take medicines only as directed by your health care provider. °· Keep all follow-up visits as directed by your health care provider. This is important. This includes any further testing if your chest pain does not go away. °· If heartburn is the cause for your chest pain, you may be told to keep your head raised (elevated) while sleeping. This reduces the chance that acid will go from your stomach into your esophagus. °· Make lifestyle changes as directed by your health care provider. These may include: °¨ Getting regular exercise. Ask your health care provider to suggest some activities that are safe for you. °¨ Eating a heart-healthy diet. A registered dietitian can help you to learn healthy eating options. °¨ Maintaining a healthy weight. °¨ Managing diabetes, if necessary. °¨ Reducing stress. °SEEK MEDICAL CARE IF: °· Your chest pain does not go away after treatment. °· You have a rash with blisters on your chest. °· You have a fever. °SEEK IMMEDIATE MEDICAL CARE IF:  °· Your chest pain is worse. °· You have an increasing cough, or you cough up blood. °· You have severe abdominal pain. °· You have severe weakness. °· You faint. °· You have chills. °· You have sudden, unexplained chest discomfort. °· You have sudden, unexplained discomfort in your arms, back, neck, or jaw. °· You have shortness of breath at any time. °· You suddenly start to sweat, or your skin gets clammy. °· You feel nauseous or you vomit. °· You suddenly feel light-headed or dizzy. °· Your heart begins to beat quickly, or it feels like it is skipping beats. °These symptoms may represent a serious problem that is an emergency. Do not wait to see if the symptoms will go away. Get medical help right away. Call your local emergency services (911 in the [place] Do not drive yourself to the hospital. °  °This  information is not intended to replace advice given to you by your health care provider. Make sure you discuss any questions you have with your health care provider. °  °Document Released: 05/02/2005 Document Revised: 08/13/2014 Document Reviewed: 02/26/2014 °[employer]vier Interactive Patient Education ©2016 [employer] ° °

## 2016-01-05 NOTE — ED Provider Notes (Signed)
[facility] Emergency Department Provider Note  ____________________________________________    I have reviewed the triage vital signs and the nursing notes.   HISTORY  Chief Complaint Chest Pain    HPI Lindsey Strickland is a 40 y.o. female who presents with chest pain. Patient reports the chest pain started approximately 3 days ago. At first it felt like indigestion but has continued intermittently over the last 3 days. She denies shortness of breath. No recent travel. No calf pain or swelling. No diaphoresis. She does report a history of high blood pressure and diabetes. Her father had cardiac bypass as well. She does not smoke.     Past Medical History  Diagnosis Date  . Hypertension   . Hypercholesterolemia   . GERD (gastroesophageal reflux disease)   . Environmental allergies   . Diabetes mellitus [facility])     Patient Active Problem List   Diagnosis Date Noted  . Palpitations 12/18/2015  . Change in bowel function 08/14/2015  . Iron deficiency anemia 08/14/2015  . Health care maintenance 02/13/2015  . LLQ abdominal pain 02/13/2015  . Menorrhagia 02/10/2015  . Severe obesity (BMI >= 40) ([place]) 08/08/2014  . Sinusitis 08/08/2014  . Bad odor of urine 11/01/2013  . Hypertension 08/25/2012  . Hypercholesterolemia 08/25/2012  . GERD (gastroesophageal reflux disease) 08/25/2012  . Diabetes mellitus ([place]) 08/25/2012    History reviewed. No pertinent past surgical history.  Current Outpatient Rx  Name  Route  Sig  Dispense  Refill  . albuterol (PROVENTIL HFA;VENTOLIN HFA) 108 (90 BASE) MCG/ACT inhaler   Inhalation   Inhale 2 puffs into the lungs every 6 (six) hours as needed for wheezing.   1 Inhaler   1   . amLODipine (NORVASC) 5 MG tablet      TAKE ONE TABLET BY MOUTH TWICE DAILY   60 tablet   11   . fluticasone (FLONASE) 50 MCG/ACT nasal spray   Nasal   Place 2 sprays into the nose daily.   16 g   3   . glucose blood test strip  Other   1 each by Other route 2 (two) times daily. Use as instructed, use one strip twice a day         . Lancets MISC   Does not apply   by Does not apply route. Use 1 lancets three times a day         . loratadine (ALAVERT) 10 MG tablet   Oral   Take 10 mg by mouth daily.         Marland Kitchen losartan (COZAAR) 100 MG tablet   Oral   Take 1 tablet (100 mg total) by mouth daily.   90 tablet   1   . metFORMIN (GLUMETZA) 1000 MG (MOD) 24 hr tablet   Oral   Take 1 tablet (1,000 mg total) by mouth 2 (two) times daily with a meal.   180 tablet   1   . metoprolol succinate (TOPROL-XL) 100 MG 24 hr tablet      TAKE ONE-HALF TABLET BY MOUTH TWICE DAILY   30 tablet   9   . pravastatin (PRAVACHOL) 40 MG tablet   Oral   Take 1 tablet (40 mg total) by mouth daily.   90 tablet   1   . Probiotic Product (ALIGN PO)   Oral   Take by mouth daily.         . ranitidine (ZANTAC) 150 MG tablet   Oral  Take 150 mg by mouth once. Take 1 tablet by mouth once a day           Allergies Augmentin; Hctz; and Other  Family History  Problem Relation Age of Onset  . Hypertension Mother   . Diabetes Mother   . Diverticulosis Mother   . Hypercholesterolemia Father   . Hypertension Father   . Heart disease Maternal Grandfather   . Diverticulosis Paternal Grandmother   . Hyperlipidemia Paternal Grandmother   . Heart failure Paternal Grandmother     Social History Social History  Substance Use Topics  . Smoking status: Former Research scientist (life sciences)  . Smokeless tobacco: Never Used  . Alcohol Use: 0.0 oz/week    0 Standard drinks or equivalent per week     Comment: rare    Review of Systems  Constitutional: Negative for fever. Eyes: Negative for redness ENT: Negative for sore throat Cardiovascular: As above Respiratory: Negative for shortness of breath. Gastrointestinal: Negative for abdominal pain Genitourinary: Negative for dysuria. Musculoskeletal: Negative for back pain. Skin:  Negative for rash. Neurological: Negative for focal weakness Psychiatric: no anxiety    ____________________________________________   PHYSICAL EXAM:  VITAL SIGNS: ED Triage Vitals  Enc Vitals Group     BP 01/05/16 0800 174/92 mmHg     Pulse Rate 01/05/16 0800 110     Resp 01/05/16 0800 20     Temp 01/05/16 0800 98.3 F (36.8 C)     Temp Source 01/05/16 0800 Oral     SpO2 01/05/16 0800 100 %     Weight 01/05/16 0758 295 lb (133.811 kg)     Height 01/05/16 0758 5\' 7"  (1.702 m)     Head Cir --      Peak Flow --      Pain Score 01/05/16 0758 0     Pain Loc --      Pain Edu? --      Excl. in [place]? --      Constitutional: Alert and oriented. Well appearing and in no distress. Pleasant and interactive Eyes: Conjunctivae are normal. No erythema or injection ENT   Head: Normocephalic and atraumatic.   Mouth/Throat: Mucous membranes are moist. Cardiovascular: Normal rate, regular rhythm. Normal and symmetric distal pulses are present in the upper extremities. No murmurs or rubs  Respiratory: Normal respiratory effort without tachypnea nor retractions. Breath sounds are clear and equal bilaterally.  Gastrointestinal: Soft and non-tender in all quadrants. No distention. There is no CVA tenderness. Genitourinary: deferred Musculoskeletal: Nontender with normal range of motion in all extremities. No lower extremity tenderness nor edema. Neurologic:  Normal speech and language. No gross focal neurologic deficits are appreciated. Skin:  Skin is warm, dry and intact. No rash noted. Psychiatric: Mood and affect are normal. Patient exhibits appropriate insight and judgment.  ____________________________________________    LABS (pertinent positives/negatives)  Labs Reviewed  CBC  COMPREHENSIVE METABOLIC PANEL  TROPONIN I  FIBRIN DERIVATIVES D-DIMER ([facility] ONLY)    ____________________________________________   EKG  ED ECG REPORT I, Lavonia Drafts, the attending  physician, personally viewed and interpreted this ECG.  Date: 01/05/2016 EKG Time: 8:02 AM Rate: 105 Rhythm: Sinus tachycardia QRS Axis: normal Intervals: normal ST/T Wave abnormalities: normal Conduction Disturbances: none Narrative Interpretation: unremarkable   ____________________________________________    RADIOLOGY  Chest x-ray unremarkable  ____________________________________________   PROCEDURES  Procedure(s) performed: none  Critical Care performed: none  ____________________________________________   INITIAL IMPRESSION / ASSESSMENT AND PLAN / ED COURSE  Pertinent labs & imaging results  that were available during my care of the patient were reviewed by me and considered in my medical decision making (see chart for details).  Patient presents with intermittent chest pain over the last 3 days. History of present illness is not particularly concerning for ACS however we will check cardiac enzymes, d-dimer, chest x-ray and basic labs and reevaluate.  Patient's troponin and d-dimer are normal. She is greatly relieved by this. She reports her chest pain has resolved without intervention. Given normal EKG, normal lab work and relief of pain pill patient is appropriate for discharge with outpatient cardiology follow-up. I discussed this with the patient and her father and they are and agreement with this plan. We discussed return precautions as well ____________________________________________   FINAL CLINICAL IMPRESSION(S) / ED DIAGNOSES  Final diagnoses:  Chest pain, unspecified chest pain type          Lavonia Drafts, MD 01/05/16 1524

## 2016-02-23 ENCOUNTER — Encounter: Payer: Self-pay | Admitting: *Deleted

## 2016-02-24 ENCOUNTER — Encounter: Admission: RE | Payer: Self-pay | Source: Ambulatory Visit

## 2016-02-24 ENCOUNTER — Ambulatory Visit
Admission: RE | Admit: 2016-02-24 | Payer: BC Managed Care – PPO | Source: Ambulatory Visit | Admitting: Gastroenterology

## 2016-02-24 SURGERY — COLONOSCOPY WITH PROPOFOL
Anesthesia: General

## 2016-02-29 ENCOUNTER — Encounter: Payer: Self-pay | Admitting: Internal Medicine

## 2016-03-21 ENCOUNTER — Telehealth: Payer: Self-pay | Admitting: Internal Medicine

## 2016-03-21 NOTE — Telephone Encounter (Signed)
spoke with [facility], they are requesting Last OV and ECG, sent via fax as requested. thanks

## 2016-03-21 NOTE — Telephone Encounter (Signed)
Mardene Celeste called from [facility] called in regards to Ms. Haskin's upcoming appointment. She needs medical records sent to her before the pt's appointment on August 28th. Please give Mardene Celeste a call if needed.  Patricia's ph# [phone] Fax# [phone]

## 2016-03-30 ENCOUNTER — Ambulatory Visit (INDEPENDENT_AMBULATORY_CARE_PROVIDER_SITE_OTHER): Payer: BC Managed Care – PPO | Admitting: Internal Medicine

## 2016-03-30 ENCOUNTER — Encounter: Payer: Self-pay | Admitting: Internal Medicine

## 2016-03-30 DIAGNOSIS — I10 Essential (primary) hypertension: Secondary | ICD-10-CM

## 2016-03-30 DIAGNOSIS — N920 Excessive and frequent menstruation with regular cycle: Secondary | ICD-10-CM | POA: Diagnosis not present

## 2016-03-30 DIAGNOSIS — E119 Type 2 diabetes mellitus without complications: Secondary | ICD-10-CM

## 2016-03-30 DIAGNOSIS — R002 Palpitations: Secondary | ICD-10-CM

## 2016-03-30 DIAGNOSIS — D509 Iron deficiency anemia, unspecified: Secondary | ICD-10-CM | POA: Diagnosis not present

## 2016-03-30 DIAGNOSIS — K219 Gastro-esophageal reflux disease without esophagitis: Secondary | ICD-10-CM

## 2016-03-30 DIAGNOSIS — E7800 Pure hypercholesterolemia, unspecified: Secondary | ICD-10-CM

## 2016-03-30 NOTE — Progress Notes (Addendum)
Patient ID: Lindsey Strickland, female   DOB: 11/27/1975, 40 y.o.   MRN: [id number]   Subjective:    Patient ID: Lindsey Strickland, female    DOB: 1976/03/15, 40 y.o.   MRN: [id number]  HPI  Patient here for a scheduled follow up.  Still having issues with her periods.  Had endometrial biopsy.  [place] for hysteroscopy.  Had to cancel due to not having vacation days.  Planning to get this done in the future.  She has been having increased issues with palpitations/flutering.  This has been persistent.  See last note.  She declined further w/up previously.  Has an appt with cardiology next week.  Due to see Dr Nehemiah Massed.  Overall breathing stable.  She saw GI.  Request refill for zantac.  Planning for EGD and colonoscopy, but again has to wait on this for her vacation time to build up.  No abdominal pain.  Had some indigestion 01/05/16.  Noticed after eating buttered popcorn at the movie.  Went to ER.  Symptoms resolved with GI cocktail.     Past Medical History:  Diagnosis Date  . Diabetes mellitus ([place])   . Environmental allergies   . GERD (gastroesophageal reflux disease)   . Hypercholesterolemia   . Hypertension    History reviewed. No pertinent surgical history. Family History  Problem Relation Age of Onset  . Hypertension Mother   . Diabetes Mother   . Diverticulosis Mother   . Hypercholesterolemia Father   . Hypertension Father   . Heart disease Maternal Grandfather   . Diverticulosis Paternal Grandmother   . Hyperlipidemia Paternal Grandmother   . Heart failure Paternal Grandmother    Social History   Social History  . Marital status: Single    Spouse name: N/A  . Number of children: 2  . Years of education: N/A   Occupational History  .  [employer]   Social History Main Topics  . Smoking status: Former Research scientist (life sciences)  . Smokeless tobacco: Never Used  . Alcohol use 0.0 oz/week     Comment: rare  . Drug use: No  . Sexual activity: Not Asked   Other Topics Concern    . None   Social History Narrative  . None    Outpatient Encounter Prescriptions as of 03/30/2016  Medication Sig  . albuterol (PROVENTIL HFA;VENTOLIN HFA) 108 (90 BASE) MCG/ACT inhaler Inhale 2 puffs into the lungs every 6 (six) hours as needed for wheezing.  Marland Kitchen amLODipine (NORVASC) 5 MG tablet TAKE ONE TABLET BY MOUTH TWICE DAILY  . ferrous sulfate 325 (65 FE) MG tablet Take 325 mg by mouth daily with breakfast.  . fluticasone (FLONASE) 50 MCG/ACT nasal spray Place 2 sprays into the nose daily.  Marland Kitchen glucose blood test strip 1 each by Other route 2 (two) times daily. Use as instructed, use one strip twice a day  . Lancets MISC by Does not apply route. Use 1 lancets three times a day  . loratadine (ALAVERT) 10 MG tablet Take 10 mg by mouth daily.  Marland Kitchen losartan (COZAAR) 100 MG tablet Take 1 tablet (100 mg total) by mouth daily.  . metFORMIN (GLUMETZA) 1000 MG (MOD) 24 hr tablet Take 1 tablet (1,000 mg total) by mouth 2 (two) times daily with a meal.  . metoprolol succinate (TOPROL-XL) 100 MG 24 hr tablet TAKE ONE-HALF TABLET BY MOUTH TWICE DAILY  . pravastatin (PRAVACHOL) 40 MG tablet Take 1 tablet (40 mg total) by mouth daily.  . Probiotic  Product (ALIGN PO) Take by mouth daily.  . ranitidine (ZANTAC) 150 MG tablet Take 1 tablet (150 mg total) by mouth once. Take 1 tablet by mouth once a day  . [DISCONTINUED] ranitidine (ZANTAC) 150 MG tablet Take 150 mg by mouth once. Take 1 tablet by mouth once a day   No facility-administered encounter medications on file as of 03/30/2016.     Review of Systems  Constitutional: Negative for appetite change and unexpected weight change.  HENT: Negative for congestion and sinus pressure.   Respiratory: Negative for cough, chest tightness and shortness of breath.   Cardiovascular: Positive for palpitations. Negative for chest pain and leg swelling.  Gastrointestinal: Negative for abdominal pain, diarrhea, nausea and vomiting.  Genitourinary: Negative for  difficulty urinating and dysuria.  Musculoskeletal: Negative for joint swelling and myalgias.  Skin: Negative for color change and rash.  Neurological: Negative for dizziness, light-headedness and headaches.  Psychiatric/Behavioral: Negative for agitation and dysphoric mood.       Objective:    Physical Exam  Constitutional: She appears well-developed and well-nourished. No distress.  HENT:  Nose: Nose normal.  Mouth/Throat: Oropharynx is clear and moist.  Neck: Neck supple. No thyromegaly present.  Cardiovascular: Normal rate and regular rhythm.   Pulmonary/Chest: Breath sounds normal. No respiratory distress. She has no wheezes.  Abdominal: Soft. Bowel sounds are normal. There is no tenderness.  Musculoskeletal: She exhibits no edema or tenderness.  Lymphadenopathy:    She has no cervical adenopathy.  Skin: No rash noted. No erythema.  Psychiatric: She has a normal mood and affect. Her behavior is normal.    BP 126/70   Pulse 89   Temp 98.3 F (36.8 C) (Oral)   Resp 18   Ht '5\' 7"'  (1.702 m)   Wt (!) 300 lb 2 oz (136.1 kg)   SpO2 99%   BMI 47.01 kg/m  Wt Readings from Last 3 Encounters:  03/30/16 (!) 300 lb 2 oz (136.1 kg)  01/05/16 295 lb (133.8 kg)  12/15/15 (!) 301 lb 2 oz (136.6 kg)     Lab Results  Component Value Date   WBC 11.8 (H) 01/05/2016   HGB 12.3 01/05/2016   HCT 37.8 01/05/2016   PLT 399 01/05/2016   GLUCOSE 150 (H) 01/05/2016   CHOL 180 12/15/2015   TRIG 160.0 (H) 12/15/2015   HDL 33.80 (L) 12/15/2015   LDLDIRECT 120.0 08/05/2015   LDLCALC 114 (H) 12/15/2015   ALT 16 01/05/2016   AST 25 01/05/2016   NA 135 01/05/2016   K 4.8 01/05/2016   CL 103 01/05/2016   CREATININE 0.61 01/05/2016   BUN 15 01/05/2016   CO2 22 01/05/2016   TSH 3.06 02/10/2015   HGBA1C 6.8 (H) 12/15/2015   MICROALBUR 1.0 02/10/2015    Dg Chest 2 View  Result Date: 01/05/2016 CLINICAL DATA:  Patient with chest pain for multiple days. EXAM: CHEST  2 VIEW COMPARISON:   Chest radiograph 07/03/2013. FINDINGS: The heart size and mediastinal contours are within normal limits. Both lungs are clear. The visualized skeletal structures are unremarkable. IMPRESSION: No active cardiopulmonary disease. Electronically Signed   By: Lovey Newcomer M.D.   On: 01/05/2016 08:57       Assessment & Plan:   Problem List Items Addressed This Visit    Diabetes mellitus ([place])    Low carb diet and exercise.  Follow met b and a1c. Not checking sugars.        Relevant Orders   Hemoglobin A1c  Microalbumin / creatinine urine ratio   GERD (gastroesophageal reflux disease)    Zantac.  Saw GI.  Planning for EGD and colonoscopy.        Relevant Medications   ranitidine (ZANTAC) 150 MG tablet   Hypercholesterolemia    Low cholesterol diet and exercise.  On pravastatin.  Follow lipid panel and liver function tests.        Relevant Orders   Lipid panel   Hepatic function panel   Hypertension    Blood pressure under good control.  Continue same medication regimen.  Follow pressures.  Follow metabolic panel.        Relevant Orders   TSH   Basic metabolic panel   Iron deficiency anemia    Increased menstrual bleeding.  Recheck cbc and ferritin.       Relevant Medications   ferrous sulfate 325 (65 FE) MG tablet   Other Relevant Orders   CBC with Differential/Platelet   Ferritin   Menorrhagia    Increased bleeding.  Planning for hysteroscopy in the future.  She will schedule when able.        Palpitations    Persistent.  See last note and this note for details.  Has appt scheduled with cardiology 04/02/16.  Further w/up pending their assessment.        Severe obesity (BMI >= 40) ([facility])    Diet and exercise.  Follow.        Other Visit Diagnoses   None.      Einar Pheasant, MD

## 2016-03-30 NOTE — Progress Notes (Signed)
Pre-visit discussion using our clinic review tool. No additional management support is needed unless otherwise documented below in the visit note.  

## 2016-04-01 ENCOUNTER — Encounter: Payer: Self-pay | Admitting: Internal Medicine

## 2016-04-01 MED ORDER — RANITIDINE HCL 150 MG PO TABS: 150.0000 mg | ORAL_TABLET | Freq: Once | ORAL | 1 refills | Status: DC

## 2016-04-01 NOTE — Assessment & Plan Note (Signed)
Diet and exercise.  Follow.  

## 2016-04-01 NOTE — Assessment & Plan Note (Signed)
Low cholesterol diet and exercise.  On pravastatin.  Follow lipid panel and liver function tests.   

## 2016-04-01 NOTE — Assessment & Plan Note (Signed)
Increased bleeding.  Planning for hysteroscopy in the future.  She will schedule when able.

## 2016-04-01 NOTE — Assessment & Plan Note (Signed)
Blood pressure under good control.  Continue same medication regimen.  Follow pressures.  Follow metabolic panel.   

## 2016-04-01 NOTE — Assessment & Plan Note (Signed)
Increased menstrual bleeding.  Recheck cbc and ferritin.

## 2016-04-01 NOTE — Assessment & Plan Note (Signed)
Low carb diet and exercise.  Follow met b and a1c. Not checking sugars.

## 2016-04-01 NOTE — Assessment & Plan Note (Signed)
Zantac.  Saw GI.  Planning for EGD and colonoscopy.

## 2016-04-01 NOTE — Addendum Note (Signed)
Addended by: Alisa Graff on: 04/01/2016 05:13 PM   Modules accepted: Orders

## 2016-04-01 NOTE — Assessment & Plan Note (Signed)
Persistent.  See last note and this note for details.  Has appt scheduled with cardiology 04/02/16.  Further w/up pending their assessment.

## 2016-04-02 ENCOUNTER — Other Ambulatory Visit (INDEPENDENT_AMBULATORY_CARE_PROVIDER_SITE_OTHER): Payer: BC Managed Care – PPO

## 2016-04-02 DIAGNOSIS — R079 Chest pain, unspecified: Secondary | ICD-10-CM | POA: Insufficient documentation

## 2016-04-02 DIAGNOSIS — D509 Iron deficiency anemia, unspecified: Secondary | ICD-10-CM

## 2016-04-02 DIAGNOSIS — E7800 Pure hypercholesterolemia, unspecified: Secondary | ICD-10-CM | POA: Diagnosis not present

## 2016-04-02 DIAGNOSIS — E119 Type 2 diabetes mellitus without complications: Secondary | ICD-10-CM | POA: Diagnosis not present

## 2016-04-02 DIAGNOSIS — I10 Essential (primary) hypertension: Secondary | ICD-10-CM

## 2016-04-02 LAB — CBC WITH DIFFERENTIAL/PLATELET
BASOS PCT: 0.3 % (ref 0.0–3.0)
Basophils Absolute: 0 10*3/uL (ref 0.0–0.1)
EOS ABS: 0.1 10*3/uL (ref 0.0–0.7)
Eosinophils Relative: 1.4 % (ref 0.0–5.0)
HCT: 36.4 % (ref 36.0–46.0)
Hemoglobin: 11.9 g/dL — ABNORMAL LOW (ref 12.0–15.0)
LYMPHS ABS: 2.5 10*3/uL (ref 0.7–4.0)
Lymphocytes Relative: 24.6 % (ref 12.0–46.0)
MCHC: 32.7 g/dL (ref 30.0–36.0)
MCV: 80.4 fl (ref 78.0–100.0)
Monocytes Absolute: 0.5 10*3/uL (ref 0.1–1.0)
Monocytes Relative: 4.8 % (ref 3.0–12.0)
NEUTROS ABS: 6.9 10*3/uL (ref 1.4–7.7)
NEUTROS PCT: 68.9 % (ref 43.0–77.0)
PLATELETS: 452 10*3/uL — AB (ref 150.0–400.0)
RBC: 4.52 Mil/uL (ref 3.87–5.11)
RDW: 15.7 % — AB (ref 11.5–15.5)
WBC: 10.1 10*3/uL (ref 4.0–10.5)

## 2016-04-02 LAB — LIPID PANEL
CHOLESTEROL: 197 mg/dL (ref 0–200)
HDL: 40.8 mg/dL (ref >39.00)
NONHDL: 155.81
TRIGLYCERIDES: 227 mg/dL — AB (ref 0.0–149.0)
Total CHOL/HDL Ratio: 5
VLDL: 45.4 mg/dL — ABNORMAL HIGH (ref 0.0–40.0)

## 2016-04-02 LAB — LDL CHOLESTEROL, DIRECT: LDL DIRECT: 125 mg/dL

## 2016-04-02 LAB — BASIC METABOLIC PANEL
BUN: 13 mg/dL (ref 6–23)
CHLORIDE: 102 meq/L (ref 96–112)
CO2: 27 meq/L (ref 19–32)
CREATININE: 0.48 mg/dL (ref 0.40–1.20)
Calcium: 9.4 mg/dL (ref 8.4–10.5)
GFR: 154.11 mL/min (ref >60.00)
Glucose, Bld: 130 mg/dL — ABNORMAL HIGH (ref 70–99)
Potassium: 4.8 mEq/L (ref 3.5–5.1)
SODIUM: 138 meq/L (ref 135–145)

## 2016-04-02 LAB — HEPATIC FUNCTION PANEL
ALK PHOS: 57 U/L (ref 39–117)
ALT: 14 U/L (ref 0–35)
AST: 12 U/L (ref 0–37)
Albumin: 4.1 g/dL (ref 3.5–5.2)
BILIRUBIN DIRECT: 0 mg/dL (ref 0.0–0.3)
BILIRUBIN TOTAL: 0.2 mg/dL (ref 0.2–1.2)
TOTAL PROTEIN: 7.1 g/dL (ref 6.0–8.3)

## 2016-04-02 LAB — TSH: TSH: 3.23 u[IU]/mL (ref 0.35–4.50)

## 2016-04-02 LAB — HEMOGLOBIN A1C: Hgb A1c MFr Bld: 6.8 % — ABNORMAL HIGH (ref 4.6–6.5)

## 2016-04-02 LAB — FERRITIN: Ferritin: 10.5 ng/mL (ref 10.0–291.0)

## 2016-04-02 NOTE — Addendum Note (Signed)
Addended by: Frutoso Chase A on: 04/02/2016 09:34 AM   Modules accepted: Orders

## 2016-04-06 ENCOUNTER — Telehealth: Payer: Self-pay | Admitting: Internal Medicine

## 2016-04-06 ENCOUNTER — Encounter: Payer: Self-pay | Admitting: *Deleted

## 2016-04-06 ENCOUNTER — Telehealth: Payer: Self-pay

## 2016-04-06 NOTE — Telephone Encounter (Signed)
Lindsey Strickland left her a message yesterday to call back about her lab results.  Let her know that I am seeing pts now.  My recommendations for her lab results are in the result note.  Please notify her of lab results and recommendations.  If problems, let me know.

## 2016-04-06 NOTE — Telephone Encounter (Signed)
Yes I spoke to patient due to her returning phone call.  She stated that she wants to talk to Dr. Nicki Reaper for results.  She also states she has questions about results.  She will Not Talk to Nurse or CMA about results. She doesn't understand why CMAs are giving her results and not the provider.  Patient stated normally she does not receive phone call. She would like to speak to PCP Directly

## 2016-04-06 NOTE — Telephone Encounter (Signed)
Patient wants to discuss directly with Dr. Nicki Reaper her lab results, No CMA and No Nurses, only Dr. Nicki Reaper. Please advise.

## 2016-04-06 NOTE — Telephone Encounter (Signed)
See below

## 2016-04-07 NOTE — Telephone Encounter (Signed)
Tried to call pt.  Left her a voice mail.  Notified her since I could not get in touch with her over the phone, I would send her a my chart message.  My chart message sent.  See my chart message.

## 2016-04-30 DIAGNOSIS — N8501 Benign endometrial hyperplasia: Secondary | ICD-10-CM | POA: Insufficient documentation

## 2016-06-26 ENCOUNTER — Other Ambulatory Visit: Payer: Self-pay

## 2016-06-26 MED ORDER — PRAVASTATIN SODIUM 40 MG PO TABS: 40.0000 mg | ORAL_TABLET | Freq: Every day | ORAL | 1 refills | Status: DC

## 2016-08-02 ENCOUNTER — Ambulatory Visit (INDEPENDENT_AMBULATORY_CARE_PROVIDER_SITE_OTHER): Payer: BC Managed Care – PPO | Admitting: Internal Medicine

## 2016-08-02 ENCOUNTER — Encounter: Payer: Self-pay | Admitting: Internal Medicine

## 2016-08-02 DIAGNOSIS — N920 Excessive and frequent menstruation with regular cycle: Secondary | ICD-10-CM | POA: Diagnosis not present

## 2016-08-02 DIAGNOSIS — E7800 Pure hypercholesterolemia, unspecified: Secondary | ICD-10-CM

## 2016-08-02 DIAGNOSIS — R002 Palpitations: Secondary | ICD-10-CM | POA: Diagnosis not present

## 2016-08-02 DIAGNOSIS — D509 Iron deficiency anemia, unspecified: Secondary | ICD-10-CM

## 2016-08-02 DIAGNOSIS — I10 Essential (primary) hypertension: Secondary | ICD-10-CM

## 2016-08-02 DIAGNOSIS — E119 Type 2 diabetes mellitus without complications: Secondary | ICD-10-CM

## 2016-08-02 NOTE — Progress Notes (Signed)
Pre visit review using our clinic review tool, if applicable. No additional management support is needed unless otherwise documented below in the visit note. 

## 2016-08-02 NOTE — Progress Notes (Signed)
Patient ID: Lindsey Strickland, female   DOB: 01/03/1976, 40 y.o.   MRN: [id number]   Subjective:    Patient ID: Lindsey Strickland, female    DOB: July 02, 1976, 40 y.o.   MRN: [id number]  HPI  Patient here for a scheduled follow up. She is s/p D&C, hysteroscopy/polypectomy and IUD placement on 05/08/16.  Is scheduled for repeat endometrial biopsy in 08/2016 to check for resolution of complex hyperplasia with atypia.  She is still having increased bleeding, but states it is better than previous.  Periods occur every 2-3 weeks.  Still some spotting in between.  Plans to discuss with gyn. She saw cardiology.  Had negative stress test.  Has f/u planned with cardiology in 09/2016.  Still with some occasional palpitations.  Not as bad recently.  Desires no further w/up at this time and does not want earlier appt.  She has not been watching her diet as well.  Plans to get more serious about her diet.  Discussed exercise.  Discussed the importance of checking sugars.  She desires not to change medication at this time.  Handling stress.  Does plan to f/u with colonoscopy, but has to postpone at this time.     Past Medical History:  Diagnosis Date  . Diabetes mellitus ([place])   . Environmental allergies   . GERD (gastroesophageal reflux disease)   . Hypercholesterolemia   . Hypertension    History reviewed. No pertinent surgical history. Family History  Problem Relation Age of Onset  . Hypertension Mother   . Diabetes Mother   . Diverticulosis Mother   . Hypercholesterolemia Father   . Hypertension Father   . Heart disease Maternal Grandfather   . Diverticulosis Paternal Grandmother   . Hyperlipidemia Paternal Grandmother   . Heart failure Paternal Grandmother    Social History   Social History  . Marital status: Single    Spouse name: N/A  . Number of children: 2  . Years of education: N/A   Occupational History  .  [employer]   Social History Main Topics  . Smoking status: Former Research scientist (life sciences)   . Smokeless tobacco: Never Used  . Alcohol use 0.0 oz/week     Comment: rare  . Drug use: No  . Sexual activity: Not Asked   Other Topics Concern  . None   Social History Narrative  . None    Outpatient Encounter Prescriptions as of 08/02/2016  Medication Sig  . albuterol (PROVENTIL HFA;VENTOLIN HFA) 108 (90 BASE) MCG/ACT inhaler Inhale 2 puffs into the lungs every 6 (six) hours as needed for wheezing.  Marland Kitchen amLODipine (NORVASC) 5 MG tablet TAKE ONE TABLET BY MOUTH TWICE DAILY  . ferrous sulfate 325 (65 FE) MG tablet Take 325 mg by mouth daily with breakfast.  . fluticasone (FLONASE) 50 MCG/ACT nasal spray Place 2 sprays into the nose daily.  Marland Kitchen glucose blood test strip 1 each by Other route 2 (two) times daily. Use as instructed, use one strip twice a day  . Lancets MISC by Does not apply route. Use 1 lancets three times a day  . loratadine (ALAVERT) 10 MG tablet Take 10 mg by mouth daily.  Marland Kitchen losartan (COZAAR) 100 MG tablet Take 1 tablet (100 mg total) by mouth daily.  . metFORMIN (GLUMETZA) 1000 MG (MOD) 24 hr tablet Take 1 tablet (1,000 mg total) by mouth 2 (two) times daily with a meal.  . metoprolol succinate (TOPROL-XL) 100 MG 24 hr tablet TAKE ONE-HALF TABLET BY MOUTH  TWICE DAILY  . pravastatin (PRAVACHOL) 40 MG tablet Take 1 tablet (40 mg total) by mouth daily.  . Probiotic Product (ALIGN PO) Take by mouth daily.  . ranitidine (ZANTAC) 150 MG tablet Take 1 tablet (150 mg total) by mouth once. Take 1 tablet by mouth once a day   No facility-administered encounter medications on file as of 08/02/2016.     Review of Systems  Constitutional: Negative for appetite change and unexpected weight change.  HENT: Negative for congestion and sinus pressure.   Respiratory: Negative for cough, chest tightness and shortness of breath.   Cardiovascular: Positive for palpitations. Negative for chest pain and leg swelling.  Gastrointestinal: Negative for abdominal pain, diarrhea, nausea and  vomiting.  Genitourinary: Negative for difficulty urinating and dysuria.       Periods as outlined.    Musculoskeletal: Negative for joint swelling and myalgias.  Skin: Negative for color change and rash.  Neurological: Negative for dizziness, light-headedness and headaches.  Psychiatric/Behavioral: Negative for agitation and dysphoric mood.       Objective:     Blood pressure rechecked by me:  136/84  Physical Exam  Constitutional: She appears well-developed and well-nourished. No distress.  HENT:  Nose: Nose normal.  Mouth/Throat: Oropharynx is clear and moist.  Neck: Neck supple. No thyromegaly present.  Cardiovascular: Normal rate and regular rhythm.   Pulmonary/Chest: Breath sounds normal. No respiratory distress. She has no wheezes.  Abdominal: Soft. Bowel sounds are normal. There is no tenderness.  Musculoskeletal: She exhibits no edema or tenderness.  Lymphadenopathy:    She has no cervical adenopathy.  Skin: No rash noted. No erythema.  Psychiatric: She has a normal mood and affect. Her behavior is normal.    BP 136/84   Pulse 99   Temp 97.6 F (36.4 C) (Oral)   Ht '5\' 7"'  (1.702 m)   Wt (!) 308 lb (139.7 kg)   SpO2 98%   BMI 48.24 kg/m  Wt Readings from Last 3 Encounters:  08/02/16 (!) 308 lb (139.7 kg)  03/30/16 (!) 300 lb 2 oz (136.1 kg)  01/05/16 295 lb (133.8 kg)     Lab Results  Component Value Date   WBC 10.1 04/02/2016   HGB 11.9 (L) 04/02/2016   HCT 36.4 04/02/2016   PLT 452.0 (H) 04/02/2016   GLUCOSE 130 (H) 04/02/2016   CHOL 197 04/02/2016   TRIG 227.0 (H) 04/02/2016   HDL 40.80 04/02/2016   LDLDIRECT 125.0 04/02/2016   LDLCALC 114 (H) 12/15/2015   ALT 14 04/02/2016   AST 12 04/02/2016   NA 138 04/02/2016   K 4.8 04/02/2016   CL 102 04/02/2016   CREATININE 0.48 04/02/2016   BUN 13 04/02/2016   CO2 27 04/02/2016   TSH 3.23 04/02/2016   HGBA1C 6.8 (H) 04/02/2016   MICROALBUR 1.0 02/10/2015    Dg Chest 2 View  Result Date:  01/05/2016 CLINICAL DATA:  Patient with chest pain for multiple days. EXAM: CHEST  2 VIEW COMPARISON:  Chest radiograph 07/03/2013. FINDINGS: The heart size and mediastinal contours are within normal limits. Both lungs are clear. The visualized skeletal structures are unremarkable. IMPRESSION: No active cardiopulmonary disease. Electronically Signed   By: Lovey Newcomer M.D.   On: 01/05/2016 08:57       Assessment & Plan:   Problem List Items Addressed This Visit    Diabetes mellitus ([place])    Low carb diet and exercise.  She plans to get more serious about her diet and exercise.  Declines labs today.  Follow met b and a1c.       Relevant Orders   Hemoglobin I2M   Basic metabolic panel   Microalbumin / creatinine urine ratio   Hypercholesterolemia    On pravastatin.  Low cholesterol diet and exercise.  Follow lipid panel and liver function tests.  She declines labs today.  Wants to post pone.        Relevant Orders   Hepatic function panel   Lipid panel   Hypertension    Blood pressure better on recheck.  Continue same medication regimen.  Follow pressures.  Follow metabolic panel.       Iron deficiency anemia    Follow cbc/ferritin.       Relevant Orders   CBC with Differential/Platelet   Ferritin   Menorrhagia    S/p D&C.  Seeing gyn.  Not as heavy.  Periods as outlined.  Has f/u biopsy planned 08/23/16.        Palpitations    Saw cardiology.  Negative stress test.  Is some better.  Has f/u planned with cardiology in 09/2016.  Desires no further intervention or earlier appt.  Follow.        Severe obesity (BMI >= 40) ([facility])    Diet and exercise.  Follow.           Einar Pheasant, MD

## 2016-08-06 ENCOUNTER — Other Ambulatory Visit: Payer: Self-pay | Admitting: Internal Medicine

## 2016-08-07 ENCOUNTER — Encounter: Payer: Self-pay | Admitting: Internal Medicine

## 2016-08-10 ENCOUNTER — Encounter: Payer: Self-pay | Admitting: Internal Medicine

## 2016-08-10 NOTE — Assessment & Plan Note (Signed)
Blood pressure better on recheck.  Continue same medication regimen.  Follow pressures.  Follow metabolic panel.    

## 2016-08-10 NOTE — Assessment & Plan Note (Signed)
Saw cardiology.  Negative stress test.  Is some better.  Has f/u planned with cardiology in 09/2016.  Desires no further intervention or earlier appt.  Follow.

## 2016-08-10 NOTE — Assessment & Plan Note (Signed)
On pravastatin.  Low cholesterol diet and exercise.  Follow lipid panel and liver function tests.  She declines labs today.  Wants to post pone.

## 2016-08-10 NOTE — Assessment & Plan Note (Signed)
Diet and exercise.  Follow.  

## 2016-08-10 NOTE — Assessment & Plan Note (Signed)
Follow cbc/ferritin.

## 2016-08-10 NOTE — Assessment & Plan Note (Signed)
S/p D&C.  Seeing gyn.  Not as heavy.  Periods as outlined.  Has f/u biopsy planned 08/23/16.

## 2016-08-10 NOTE — Assessment & Plan Note (Signed)
Low carb diet and exercise.  She plans to get more serious about her diet and exercise.  Declines labs today.  Follow met b and a1c.

## 2016-08-26 ENCOUNTER — Other Ambulatory Visit: Payer: Self-pay | Admitting: Internal Medicine

## 2016-09-14 ENCOUNTER — Encounter: Payer: Self-pay | Admitting: Internal Medicine

## 2016-09-14 MED ORDER — FLUTICASONE PROPIONATE 50 MCG/ACT NA SUSP: 2.0000 | Freq: Every day | NASAL | 3 refills | Status: DC

## 2016-09-14 NOTE — Telephone Encounter (Signed)
rx ok'd for flonase one month with 2 refills.

## 2016-10-02 ENCOUNTER — Other Ambulatory Visit: Payer: BC Managed Care – PPO

## 2016-11-09 ENCOUNTER — Telehealth: Payer: Self-pay | Admitting: Radiology

## 2016-11-09 ENCOUNTER — Other Ambulatory Visit (INDEPENDENT_AMBULATORY_CARE_PROVIDER_SITE_OTHER): Payer: BC Managed Care – PPO

## 2016-11-09 ENCOUNTER — Ambulatory Visit (INDEPENDENT_AMBULATORY_CARE_PROVIDER_SITE_OTHER): Payer: BC Managed Care – PPO | Admitting: Internal Medicine

## 2016-11-09 ENCOUNTER — Encounter: Payer: Self-pay | Admitting: Internal Medicine

## 2016-11-09 VITALS — BP 136/78 | HR 100 | Temp 98.9°F | Resp 14 | Ht 67.0 in | Wt 314.6 lb

## 2016-11-09 DIAGNOSIS — E119 Type 2 diabetes mellitus without complications: Secondary | ICD-10-CM

## 2016-11-09 DIAGNOSIS — Z0000 Encounter for general adult medical examination without abnormal findings: Secondary | ICD-10-CM

## 2016-11-09 DIAGNOSIS — J0191 Acute recurrent sinusitis, unspecified: Secondary | ICD-10-CM

## 2016-11-09 DIAGNOSIS — D509 Iron deficiency anemia, unspecified: Secondary | ICD-10-CM

## 2016-11-09 DIAGNOSIS — K219 Gastro-esophageal reflux disease without esophagitis: Secondary | ICD-10-CM | POA: Diagnosis not present

## 2016-11-09 DIAGNOSIS — N644 Mastodynia: Secondary | ICD-10-CM

## 2016-11-09 DIAGNOSIS — I10 Essential (primary) hypertension: Secondary | ICD-10-CM | POA: Diagnosis not present

## 2016-11-09 DIAGNOSIS — E7800 Pure hypercholesterolemia, unspecified: Secondary | ICD-10-CM | POA: Diagnosis not present

## 2016-11-09 LAB — CBC WITH DIFFERENTIAL/PLATELET
BASOS ABS: 0 10*3/uL (ref 0.0–0.1)
Basophils Relative: 0.3 % (ref 0.0–3.0)
Eosinophils Absolute: 0.2 10*3/uL (ref 0.0–0.7)
Eosinophils Relative: 1.6 % (ref 0.0–5.0)
HCT: 36.2 % (ref 36.0–46.0)
Hemoglobin: 11.8 g/dL — ABNORMAL LOW (ref 12.0–15.0)
LYMPHS ABS: 2.5 10*3/uL (ref 0.7–4.0)
Lymphocytes Relative: 24.1 % (ref 12.0–46.0)
MCHC: 32.7 g/dL (ref 30.0–36.0)
MCV: 82 fl (ref 78.0–100.0)
MONOS PCT: 6 % (ref 3.0–12.0)
Monocytes Absolute: 0.6 10*3/uL (ref 0.1–1.0)
NEUTROS ABS: 7 10*3/uL (ref 1.4–7.7)
NEUTROS PCT: 68 % (ref 43.0–77.0)
PLATELETS: 415 10*3/uL — AB (ref 150.0–400.0)
RBC: 4.42 Mil/uL (ref 3.87–5.11)
RDW: 15.4 % (ref 11.5–15.5)
WBC: 10.2 10*3/uL (ref 4.0–10.5)

## 2016-11-09 LAB — HEPATIC FUNCTION PANEL
ALBUMIN: 4.1 g/dL (ref 3.5–5.2)
ALT: 13 U/L (ref 0–35)
AST: 9 U/L (ref 0–37)
Alkaline Phosphatase: 62 U/L (ref 39–117)
BILIRUBIN DIRECT: 0 mg/dL (ref 0.0–0.3)
TOTAL PROTEIN: 6.9 g/dL (ref 6.0–8.3)
Total Bilirubin: 0.3 mg/dL (ref 0.2–1.2)

## 2016-11-09 LAB — LIPID PANEL
Cholesterol: 197 mg/dL (ref 0–200)
HDL: 39.6 mg/dL (ref >39.00)
NONHDL: 157.57
TRIGLYCERIDES: 213 mg/dL — AB (ref 0.0–149.0)
Total CHOL/HDL Ratio: 5
VLDL: 42.6 mg/dL — ABNORMAL HIGH (ref 0.0–40.0)

## 2016-11-09 LAB — BASIC METABOLIC PANEL
BUN: 11 mg/dL (ref 6–23)
CHLORIDE: 102 meq/L (ref 96–112)
CO2: 27 meq/L (ref 19–32)
CREATININE: 0.47 mg/dL (ref 0.40–1.20)
Calcium: 9.4 mg/dL (ref 8.4–10.5)
GFR: 157.39 mL/min (ref >60.00)
GLUCOSE: 162 mg/dL — AB (ref 70–99)
Potassium: 4.5 mEq/L (ref 3.5–5.1)
Sodium: 137 mEq/L (ref 135–145)

## 2016-11-09 LAB — FERRITIN: Ferritin: 12.5 ng/mL (ref 10.0–291.0)

## 2016-11-09 LAB — HEMOGLOBIN A1C: Hgb A1c MFr Bld: 7.7 % — ABNORMAL HIGH (ref 4.6–6.5)

## 2016-11-09 LAB — LDL CHOLESTEROL, DIRECT: Direct LDL: 126 mg/dL

## 2016-11-09 MED ORDER — CEFDINIR 300 MG PO CAPS: 300.0000 mg | ORAL_CAPSULE | Freq: Two times a day (BID) | ORAL | 0 refills | Status: DC

## 2016-11-09 NOTE — Progress Notes (Signed)
Patient ID: DAURICE OVANDO, female   DOB: 1976/08/01, 41 y.o.   MRN: [id number]   Subjective:    Patient ID: KARENANN MCGRORY, female    DOB: 03-21-1976, 41 y.o.   MRN: [id number]  HPI  Patient here for her physical exam.  Sees gyn for her pelvic exams.  Is s/p D&C, hysteroscopy and polypectomy.  Had IUD placed.  Recent biopsy - no hyperplasia.  Bleeding is some better.  Has f/u planned with gyn.  She has had sinus infection.  Was seen 10/2016 at acute care.  Treated with augmentin.  Symptoms improved by did not resolve.  With increased sinus pressure and congestion and drainage.  No fever.  Symptoms worsening again  Persistent.  No chest pain.  Breathing stable.   Not watching her diet.  Not exercising. Discussed lab results.  Discussed elevated a1c - 7.7.  Discussed treatment. She declines.  Wants to work on diet and exercise.  No abdominal pain.  Bowels moving.  Increased pain right breast - 1:00 region.  No lumps.     Past Medical History:  Diagnosis Date  . Diabetes mellitus ([place])   . Environmental allergies   . GERD (gastroesophageal reflux disease)   . Hypercholesterolemia   . Hypertension    History reviewed. No pertinent surgical history. Family History  Problem Relation Age of Onset  . Hypertension Mother   . Diabetes Mother   . Diverticulosis Mother   . Hypercholesterolemia Father   . Hypertension Father   . Heart disease Maternal Grandfather   . Diverticulosis Paternal Grandmother   . Hyperlipidemia Paternal Grandmother   . Heart failure Paternal Grandmother    Social History   Social History  . Marital status: Single    Spouse name: N/A  . Number of children: 2  . Years of education: N/A   Occupational History  .  [employer]   Social History Main Topics  . Smoking status: Former Research scientist (life sciences)  . Smokeless tobacco: Never Used  . Alcohol use 0.0 oz/week     Comment: rare  . Drug use: No  . Sexual activity: Not Asked   Other Topics Concern  . None    Social History Narrative  . None    Outpatient Encounter Prescriptions as of 11/09/2016  Medication Sig  . albuterol (PROVENTIL HFA;VENTOLIN HFA) 108 (90 BASE) MCG/ACT inhaler Inhale 2 puffs into the lungs every 6 (six) hours as needed for wheezing.  Marland Kitchen amLODipine (NORVASC) 10 MG tablet TAKE ONE-HALF TABLET BY MOUTH TWICE DAILY  . amLODipine (NORVASC) 5 MG tablet TAKE ONE TABLET BY MOUTH TWICE DAILY  . cefdinir (OMNICEF) 300 MG capsule Take 1 capsule (300 mg total) by mouth 2 (two) times daily.  . ferrous sulfate 325 (65 FE) MG tablet Take 325 mg by mouth daily with breakfast.  . fluticasone (FLONASE) 50 MCG/ACT nasal spray Place 2 sprays into both nostrils daily.  Marland Kitchen glucose blood test strip 1 each by Other route 2 (two) times daily. Use as instructed, use one strip twice a day  . Lancets MISC by Does not apply route. Use 1 lancets three times a day  . loratadine (ALAVERT) 10 MG tablet Take 10 mg by mouth daily.  Marland Kitchen losartan (COZAAR) 100 MG tablet Take 1 tablet (100 mg total) by mouth daily.  . metFORMIN (GLUCOPHAGE-XR) 500 MG 24 hr tablet TAKE TWO TABLETS BY MOUTH TWICE DAILY  . metFORMIN (GLUMETZA) 1000 MG (MOD) 24 hr tablet Take 1 tablet (1,000 mg total)  by mouth 2 (two) times daily with a meal.  . metoprolol succinate (TOPROL-XL) 100 MG 24 hr tablet TAKE ONE-HALF TABLET BY MOUTH TWICE DAILY  . pravastatin (PRAVACHOL) 40 MG tablet Take 1 tablet (40 mg total) by mouth daily.  . Probiotic Product (ALIGN PO) Take by mouth daily.  . ranitidine (ZANTAC) 150 MG tablet Take 1 tablet (150 mg total) by mouth once. Take 1 tablet by mouth once a day   No facility-administered encounter medications on file as of 11/09/2016.     Review of Systems  Constitutional: Negative for appetite change and unexpected weight change.  HENT: Positive for congestion, postnasal drip and sinus pressure.   Eyes: Negative for pain and visual disturbance.  Respiratory: Negative for cough, chest tightness and shortness  of breath.   Cardiovascular: Negative for chest pain, palpitations and leg swelling.  Gastrointestinal: Negative for abdominal pain, diarrhea, nausea and vomiting.  Genitourinary: Negative for difficulty urinating and dysuria.  Musculoskeletal: Negative for back pain and joint swelling.  Skin: Negative for color change and rash.  Neurological: Negative for dizziness, light-headedness and headaches.  Hematological: Negative for adenopathy. Does not bruise/bleed easily.  Psychiatric/Behavioral: Negative for agitation and dysphoric mood.       Objective:     Blood pressure rechecked by me:  [phone]/51  Physical Exam  Constitutional: She is oriented to person, place, and time. She appears well-developed and well-nourished. No distress.  HENT:  Nose: Nose normal.  Mouth/Throat: Oropharynx is clear and moist.  Eyes: Right eye exhibits no discharge. Left eye exhibits no discharge. No scleral icterus.  Neck: Neck supple. No thyromegaly present.  Cardiovascular: Normal rate and regular rhythm.   Pulmonary/Chest: Breath sounds normal. No accessory muscle usage. No tachypnea. No respiratory distress. She has no decreased breath sounds. She has no wheezes. She has no rhonchi. Right breast exhibits no inverted nipple, no mass, no nipple discharge and no tenderness (no axillary adenopathy). Left breast exhibits no inverted nipple, no mass, no nipple discharge and no tenderness (no axilarry adenopathy).  Pain - right breast 1:00 region.  No mass.   Abdominal: Soft. Bowel sounds are normal. There is no tenderness.  Musculoskeletal: She exhibits no edema or tenderness.  Lymphadenopathy:    She has no cervical adenopathy.  Neurological: She is alert and oriented to person, place, and time.  Skin: Skin is warm. No rash noted. No erythema.  Psychiatric: She has a normal mood and affect. Her behavior is normal.    BP 136/78   Pulse 100   Temp 98.9 F (37.2 C) (Oral)   Resp 14   Ht '5\' 7"'  (1.702 m)    Wt (!) 314 lb 9.6 oz (142.7 kg)   LMP  (Approximate) Comment: last month   SpO2 98%   BMI 49.27 kg/m  Wt Readings from Last 3 Encounters:  11/09/16 (!) 314 lb 9.6 oz (142.7 kg)  08/02/16 (!) 308 lb (139.7 kg)  03/30/16 (!) 300 lb 2 oz (136.1 kg)     Lab Results  Component Value Date   WBC 10.2 11/09/2016   HGB 11.8 (L) 11/09/2016   HCT 36.2 11/09/2016   PLT 415.0 (H) 11/09/2016   GLUCOSE 162 (H) 11/09/2016   CHOL 197 11/09/2016   TRIG 213.0 (H) 11/09/2016   HDL 39.60 11/09/2016   LDLDIRECT 126.0 11/09/2016   LDLCALC 114 (H) 12/15/2015   ALT 13 11/09/2016   AST 9 11/09/2016   NA 137 11/09/2016   K 4.5 11/09/2016   CL  102 11/09/2016   CREATININE 0.47 11/09/2016   BUN 11 11/09/2016   CO2 27 11/09/2016   TSH 3.23 04/02/2016   HGBA1C 7.7 (H) 11/09/2016   MICROALBUR 1.0 02/10/2015    Dg Chest 2 View  Result Date: 01/05/2016 CLINICAL DATA:  Patient with chest pain for multiple days. EXAM: CHEST  2 VIEW COMPARISON:  Chest radiograph 07/03/2013. FINDINGS: The heart size and mediastinal contours are within normal limits. Both lungs are clear. The visualized skeletal structures are unremarkable. IMPRESSION: No active cardiopulmonary disease. Electronically Signed   By: Lovey Newcomer M.D.   On: 01/05/2016 08:57       Assessment & Plan:   Problem List Items Addressed This Visit    Diabetes mellitus ([place])    Discussed at length with her today.  Discussed diet and exercise.  Discussed medication treatment.  She declines.  Wants to work on diet and exercise.  Follow met b and a1c.       Relevant Orders   Hemoglobin A1c   GERD (gastroesophageal reflux disease)    On zantac.  Saw GI.  Will schedule EGD and colonoscopy when able.  Work limitations.        Health care maintenance    Physical today 11/09/16.  PAP 10/16/12 - negative with negative HPV.  Seeing gyn now.  Schedule mammogram as outlined.  GI evaluation in future.       Hypercholesterolemia    Low cholesterol diet and  exercise.  On pravastatin.  Follow lipid panel and liver function tests.        Relevant Orders   Hepatic function panel   Lipid panel   Hypertension    Recheck improved.  Have her spot check her pressure.  Same medication regimen.  Follow met b,        Relevant Orders   Basic metabolic panel   Iron deficiency anemia    On iron.  hgb stable.  Follow.  Periods are some better.        Relevant Orders   CBC with Differential/Platelet   Ferritin   Severe obesity (BMI >= 40) ([facility])    Diet and exercise.  Follow.       Sinusitis    Treated recently with augmentin.  Symptoms improved but did not resolve.  Feel partially treated.  omnicef as directed.  Continue nasal spray.  Follow.        Relevant Medications   cefdinir (OMNICEF) 300 MG capsule    Other Visit Diagnoses    Routine general medical examination at a health care facility    -  Primary   Breast tenderness in female       Right breast tenderness as outlined.  check bilateral diagnostic mammogram.     Relevant Orders   MM Digital Diagnostic Bilat   US BREAST LTD UNI RIGHT INC AXILLA   US BREAST LTD UNI LEFT INC Noreene Filbert, MD

## 2016-11-09 NOTE — Progress Notes (Signed)
Pre-visit discussion using our clinic review tool. No additional management support is needed unless otherwise documented below in the visit note.  

## 2016-11-09 NOTE — Telephone Encounter (Signed)
PT came in for labs this morning and states she has a appt this afternoon and states she will give a urine specimen when she comes for her appt.

## 2016-11-10 ENCOUNTER — Encounter: Payer: Self-pay | Admitting: Internal Medicine

## 2016-11-10 NOTE — Assessment & Plan Note (Signed)
Discussed at length with her today.  Discussed diet and exercise.  Discussed medication treatment.  She declines.  Wants to work on diet and exercise.  Follow met b and a1c.

## 2016-11-10 NOTE — Assessment & Plan Note (Signed)
Physical today 11/09/16.  PAP 10/16/12 - negative with negative HPV.  Seeing gyn now.  Schedule mammogram as outlined.  GI evaluation in future.

## 2016-11-10 NOTE — Assessment & Plan Note (Signed)
Treated recently with augmentin.  Symptoms improved but did not resolve.  Feel partially treated.  omnicef as directed.  Continue nasal spray.  Follow.

## 2016-11-10 NOTE — Assessment & Plan Note (Signed)
Diet and exercise.  Follow.  

## 2016-11-10 NOTE — Assessment & Plan Note (Signed)
Recheck improved.  Have her spot check her pressure.  Same medication regimen.  Follow met b,

## 2016-11-10 NOTE — Assessment & Plan Note (Signed)
On iron.  hgb stable.  Follow.  Periods are some better.

## 2016-11-10 NOTE — Assessment & Plan Note (Signed)
On zantac.  Saw GI.  Will schedule EGD and colonoscopy when able.  Work limitations.

## 2016-11-10 NOTE — Assessment & Plan Note (Signed)
Low cholesterol diet and exercise.  On pravastatin.  Follow lipid panel and liver function tests.   

## 2016-11-26 ENCOUNTER — Other Ambulatory Visit: Payer: Self-pay | Admitting: Internal Medicine

## 2016-12-03 ENCOUNTER — Other Ambulatory Visit: Payer: Self-pay | Admitting: Internal Medicine

## 2016-12-03 DIAGNOSIS — N644 Mastodynia: Secondary | ICD-10-CM

## 2016-12-03 NOTE — Progress Notes (Signed)
Order placed for mammogram ([facility]).

## 2016-12-05 ENCOUNTER — Telehealth: Payer: Self-pay

## 2016-12-05 NOTE — Telephone Encounter (Signed)
Faxed received from [facility] not able to reach patient to make app for mammogram. I have sent letter to scan and l/m for patient to call office so we can let her know that she needs to call for appointment.

## 2016-12-17 ENCOUNTER — Other Ambulatory Visit: Payer: Self-pay | Admitting: Internal Medicine

## 2016-12-23 ENCOUNTER — Other Ambulatory Visit: Payer: Self-pay | Admitting: Internal Medicine

## 2016-12-31 ENCOUNTER — Other Ambulatory Visit: Payer: Self-pay | Admitting: Internal Medicine

## 2017-01-28 ENCOUNTER — Other Ambulatory Visit: Payer: Self-pay | Admitting: Internal Medicine

## 2017-02-15 ENCOUNTER — Ambulatory Visit: Payer: BC Managed Care – PPO | Admitting: Internal Medicine

## 2017-02-15 ENCOUNTER — Other Ambulatory Visit: Payer: BC Managed Care – PPO

## 2017-02-15 DIAGNOSIS — Z0289 Encounter for other administrative examinations: Secondary | ICD-10-CM

## 2017-02-20 ENCOUNTER — Encounter: Payer: Self-pay | Admitting: Internal Medicine

## 2017-03-22 DIAGNOSIS — Z30431 Encounter for routine checking of intrauterine contraceptive device: Secondary | ICD-10-CM | POA: Insufficient documentation

## 2017-05-27 ENCOUNTER — Other Ambulatory Visit: Payer: Self-pay | Admitting: Internal Medicine

## 2017-06-16 ENCOUNTER — Other Ambulatory Visit: Payer: Self-pay | Admitting: Internal Medicine

## 2017-06-23 ENCOUNTER — Other Ambulatory Visit: Payer: Self-pay | Admitting: Internal Medicine

## 2017-07-18 ENCOUNTER — Encounter: Payer: Self-pay | Admitting: Internal Medicine

## 2017-07-18 ENCOUNTER — Ambulatory Visit: Payer: BC Managed Care – PPO | Admitting: Internal Medicine

## 2017-07-18 DIAGNOSIS — K219 Gastro-esophageal reflux disease without esophagitis: Secondary | ICD-10-CM | POA: Diagnosis not present

## 2017-07-18 DIAGNOSIS — E119 Type 2 diabetes mellitus without complications: Secondary | ICD-10-CM

## 2017-07-18 DIAGNOSIS — I10 Essential (primary) hypertension: Secondary | ICD-10-CM

## 2017-07-18 DIAGNOSIS — D509 Iron deficiency anemia, unspecified: Secondary | ICD-10-CM | POA: Diagnosis not present

## 2017-07-18 DIAGNOSIS — E7800 Pure hypercholesterolemia, unspecified: Secondary | ICD-10-CM

## 2017-07-18 DIAGNOSIS — N920 Excessive and frequent menstruation with regular cycle: Secondary | ICD-10-CM | POA: Diagnosis not present

## 2017-07-18 DIAGNOSIS — F439 Reaction to severe stress, unspecified: Secondary | ICD-10-CM | POA: Diagnosis not present

## 2017-07-18 NOTE — Progress Notes (Signed)
Patient ID: Lindsey Strickland, female   DOB: January 22, 1976, 41 y.o.   MRN: [id number]   Subjective:    Patient ID: Lindsey Strickland, female    DOB: 1976-01-01, 41 y.o.   MRN: [id number]  HPI  Patient here for a scheduled follow up.  Has multiple concerns.  Increased stress and anxiety.  Discussed at length with her today.  She desires not to take medication.  Did agree to seeing a psychiatrist.  Not watching her diet.  Not exercising.  Discussed elevated sugars.  Seeing gyn.  S/p D&C/hysteroscopy/polypectomy in 10/17.  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  Has mirena UD.  Bleeding is better.  Still with increased bleeding.  Plans to discuss with gyn.  Still with bowel issues.  Discussed need for colon evaluation. She plans to f/u with GI, but unable to at this point.  Wants to hold off now.  Wants to hold on mammogram.     Past Medical History:  Diagnosis Date  . Diabetes mellitus ([place])   . Environmental allergies   . GERD (gastroesophageal reflux disease)   . Hypercholesterolemia   . Hypertension    History reviewed. No pertinent surgical history. Family History  Problem Relation Age of Onset  . Hypertension Mother   . Diabetes Mother   . Diverticulosis Mother   . Hypercholesterolemia Father   . Hypertension Father   . Heart disease Maternal Grandfather   . Diverticulosis Paternal Grandmother   . Hyperlipidemia Paternal Grandmother   . Heart failure Paternal Grandmother    Social History   Socioeconomic History  . Marital status: Single    Spouse name: None  . Number of children: 2  . Years of education: None  . Highest education level: None  Social Needs  . Financial resource strain: None  . Food insecurity - worry: None  . Food insecurity - inability: None  . Transportation needs - medical: None  . Transportation needs - non-medical: None  Occupational History    Employer: [employer]  Tobacco Use  . Smoking status: Former  Research scientist (life sciences)  . Smokeless tobacco: Never Used  Substance and Sexual Activity  . Alcohol use: Yes    Alcohol/week: 0.0 oz    Comment: rare  . Drug use: No  . Sexual activity: None  Other Topics Concern  . None  Social History Narrative  . None    Outpatient Encounter Medications as of 07/18/2017  Medication Sig  . albuterol (PROVENTIL HFA;VENTOLIN HFA) 108 (90 BASE) MCG/ACT inhaler Inhale 2 puffs into the lungs every 6 (six) hours as needed for wheezing.  Marland Kitchen amLODipine (NORVASC) 10 MG tablet TAKE ONE-HALF TABLET BY MOUTH TWICE DAILY  . amLODipine (NORVASC) 5 MG tablet TAKE ONE TABLET BY MOUTH TWICE DAILY  . cefdinir (OMNICEF) 300 MG capsule Take 1 capsule (300 mg total) by mouth 2 (two) times daily.  . ferrous sulfate 325 (65 FE) MG tablet Take 325 mg by mouth daily with breakfast.  . fluticasone (FLONASE) 50 MCG/ACT nasal spray Place 2 sprays into both nostrils daily.  Marland Kitchen glucose blood test strip 1 each by Other route 2 (two) times daily. Use as instructed, use one strip twice a day  . Lancets MISC by Does not apply route. Use 1 lancets three times a day  . loratadine (ALAVERT) 10 MG tablet Take 10 mg by mouth daily.  Marland Kitchen losartan (COZAAR) 100 MG tablet TAKE 1 TABLET BY MOUTH ONCE DAILY  . metFORMIN (GLUCOPHAGE-XR) 500 MG  24 hr tablet TAKE TWO TABLETS BY MOUTH TWICE DAILY  . metFORMIN (GLUMETZA) 1000 MG (MOD) 24 hr tablet Take 1 tablet (1,000 mg total) by mouth 2 (two) times daily with a meal.  . metoprolol succinate (TOPROL-XL) 100 MG 24 hr tablet TAKE ONE-HALF TABLET BY MOUTH TWICE DAILY  . pravastatin (PRAVACHOL) 40 MG tablet Take 1 tablet (40 mg total) daily by mouth. Must keep appt on 07/18/17 for a refill  . Probiotic Product (ALIGN PO) Take by mouth daily.  . ranitidine (ZANTAC) 150 MG tablet TAKE 1 TABLET BY MOUTH ONCE DAILY   No facility-administered encounter medications on file as of 07/18/2017.     Review of Systems  Constitutional: Negative for appetite change and unexpected  weight change.  HENT: Negative for congestion and sinus pressure.   Respiratory: Negative for cough, chest tightness and shortness of breath.   Cardiovascular: Negative for chest pain, palpitations and leg swelling.  Gastrointestinal: Negative for abdominal pain, nausea and vomiting.       Bowel issues persistent.    Genitourinary: Negative for difficulty urinating and dysuria.  Musculoskeletal: Negative for joint swelling and myalgias.  Skin: Negative for color change and rash.  Neurological: Negative for dizziness, light-headedness and headaches.  Psychiatric/Behavioral: Negative for dysphoric mood.       Increased stress and anxiety as outlined.         Objective:     Blood pressure rechecked by me:  142/88, pulse 100  Physical Exam  Constitutional: She appears well-developed and well-nourished. No distress.  HENT:  Nose: Nose normal.  Mouth/Throat: Oropharynx is clear and moist.  Neck: Neck supple. No thyromegaly present.  Cardiovascular: Normal rate and regular rhythm.  Pulmonary/Chest: Breath sounds normal. No respiratory distress. She has no wheezes.  Abdominal: Soft. Bowel sounds are normal. There is no tenderness.  Musculoskeletal: She exhibits no edema or tenderness.  Lymphadenopathy:    She has no cervical adenopathy.  Skin: No rash noted. No erythema.  Psychiatric: She has a normal mood and affect. Her behavior is normal.    BP (!) 144/96 (BP Location: Left Arm, Patient Position: Sitting, Cuff Size: Large)   Pulse (!) 102   Temp 98.1 F (36.7 C) (Oral)   Wt (!) 312 lb (141.5 kg)   BMI 48.87 kg/m  Wt Readings from Last 3 Encounters:  07/18/17 (!) 312 lb (141.5 kg)  11/09/16 (!) 314 lb 9.6 oz (142.7 kg)  08/02/16 (!) 308 lb (139.7 kg)     Lab Results  Component Value Date   WBC 10.2 11/09/2016   HGB 11.8 (L) 11/09/2016   HCT 36.2 11/09/2016   PLT 415.0 (H) 11/09/2016   GLUCOSE 162 (H) 11/09/2016   CHOL 197 11/09/2016   TRIG 213.0 (H) 11/09/2016    HDL 39.60 11/09/2016   LDLDIRECT 126.0 11/09/2016   LDLCALC 114 (H) 12/15/2015   ALT 13 11/09/2016   AST 9 11/09/2016   NA 137 11/09/2016   K 4.5 11/09/2016   CL 102 11/09/2016   CREATININE 0.47 11/09/2016   BUN 11 11/09/2016   CO2 27 11/09/2016   TSH 3.23 04/02/2016   HGBA1C 7.7 (H) 11/09/2016   MICROALBUR 1.0 02/10/2015    Dg Chest 2 View  Result Date: 01/05/2016 CLINICAL DATA:  Patient with chest pain for multiple days. EXAM: CHEST  2 VIEW COMPARISON:  Chest radiograph 07/03/2013. FINDINGS: The heart size and mediastinal contours are within normal limits. Both lungs are clear. The visualized skeletal structures are unremarkable. IMPRESSION: No  active cardiopulmonary disease. Electronically Signed   By: Lovey Newcomer M.D.   On: 01/05/2016 08:57       Assessment & Plan:   Problem List Items Addressed This Visit    Diabetes mellitus ([place])    Discussed with her today.  Discussed diet and exercise.  She wants to hold on further medication.  Wants to work on diet and exercise.  Follow met b and a1c.  Discussed the need for eye checks.        GERD (gastroesophageal reflux disease)    Symptoms controlled.        Hypercholesterolemia    Low cholesterol diet and exercise.  On pravastatin.  Follow lipid panel and liver function tests.        Hypertension    Blood pressure increased.  Discussed with her today.  Feel a lot of her blood pressure increase is related to the increased stress.  Treat stress.  Follow pressures.  If persistent elevation, will require additional medication.  Follow met b.        Iron deficiency anemia    Follow cbc.        Menorrhagia    S/p D&C ,etc.  Has mirena IUD.  Plans to f/u with gyn.        Severe obesity (BMI >= 40) ([facility])    Diet and exercise.  Discussed with her today.  Follow.        Stress    Increased stress and anxiety as outlined.  Discussed with her today.  Refer to psychiatry.  She wants to hold on medication.  Follow.         Relevant Orders   Ambulatory referral to Psychiatry       Einar Pheasant, MD

## 2017-07-21 ENCOUNTER — Encounter: Payer: Self-pay | Admitting: Internal Medicine

## 2017-07-21 DIAGNOSIS — F439 Reaction to severe stress, unspecified: Secondary | ICD-10-CM | POA: Insufficient documentation

## 2017-07-21 NOTE — Assessment & Plan Note (Signed)
Symptoms controlled

## 2017-07-21 NOTE — Assessment & Plan Note (Signed)
Low cholesterol diet and exercise.  On pravastatin.  Follow lipid panel and liver function tests.   

## 2017-07-21 NOTE — Assessment & Plan Note (Signed)
Blood pressure increased.  Discussed with her today.  Feel a lot of her blood pressure increase is related to the increased stress.  Treat stress.  Follow pressures.  If persistent elevation, will require additional medication.  Follow met b.

## 2017-07-21 NOTE — Assessment & Plan Note (Signed)
Discussed with her today.  Discussed diet and exercise.  She wants to hold on further medication.  Wants to work on diet and exercise.  Follow met b and a1c.  Discussed the need for eye checks.

## 2017-07-21 NOTE — Assessment & Plan Note (Signed)
Increased stress and anxiety as outlined.  Discussed with her today.  Refer to psychiatry.  She wants to hold on medication.  Follow.

## 2017-07-21 NOTE — Assessment & Plan Note (Signed)
S/p D&C ,etc.  Has mirena IUD.  Plans to f/u with gyn.

## 2017-07-21 NOTE — Assessment & Plan Note (Signed)
Diet and exercise.  Discussed with her today.  Follow.

## 2017-07-21 NOTE — Assessment & Plan Note (Signed)
Follow cbc.  

## 2017-07-29 ENCOUNTER — Other Ambulatory Visit: Payer: Self-pay | Admitting: Internal Medicine

## 2017-08-01 ENCOUNTER — Other Ambulatory Visit: Payer: BC Managed Care – PPO

## 2017-08-11 ENCOUNTER — Other Ambulatory Visit: Payer: Self-pay | Admitting: Internal Medicine

## 2017-08-26 ENCOUNTER — Other Ambulatory Visit: Payer: Self-pay | Admitting: Internal Medicine

## 2017-09-06 ENCOUNTER — Encounter: Payer: Self-pay | Admitting: Internal Medicine

## 2017-09-07 MED ORDER — ALBUTEROL SULFATE HFA 108 (90 BASE) MCG/ACT IN AERS: 2.0000 | INHALATION_SPRAY | Freq: Four times a day (QID) | RESPIRATORY_TRACT | 1 refills | Status: DC | PRN

## 2017-09-07 NOTE — Telephone Encounter (Signed)
rx sent in for albuterol inhaler (#1 with one refill).

## 2017-09-23 ENCOUNTER — Other Ambulatory Visit: Payer: Self-pay | Admitting: Internal Medicine

## 2017-09-24 ENCOUNTER — Ambulatory Visit: Payer: BC Managed Care – PPO | Admitting: Internal Medicine

## 2017-10-22 ENCOUNTER — Other Ambulatory Visit (INDEPENDENT_AMBULATORY_CARE_PROVIDER_SITE_OTHER): Payer: BC Managed Care – PPO

## 2017-10-22 DIAGNOSIS — D509 Iron deficiency anemia, unspecified: Secondary | ICD-10-CM

## 2017-10-22 DIAGNOSIS — E119 Type 2 diabetes mellitus without complications: Secondary | ICD-10-CM

## 2017-10-22 DIAGNOSIS — E7800 Pure hypercholesterolemia, unspecified: Secondary | ICD-10-CM | POA: Diagnosis not present

## 2017-10-22 DIAGNOSIS — I10 Essential (primary) hypertension: Secondary | ICD-10-CM | POA: Diagnosis not present

## 2017-10-22 DIAGNOSIS — Z0000 Encounter for general adult medical examination without abnormal findings: Secondary | ICD-10-CM

## 2017-10-22 LAB — HEPATIC FUNCTION PANEL
ALBUMIN: 4.1 g/dL (ref 3.5–5.2)
ALT: 16 U/L (ref 0–35)
AST: 10 U/L (ref 0–37)
Alkaline Phosphatase: 61 U/L (ref 39–117)
Bilirubin, Direct: 0.1 mg/dL (ref 0.0–0.3)
Total Bilirubin: 0.4 mg/dL (ref 0.2–1.2)
Total Protein: 6.9 g/dL (ref 6.0–8.3)

## 2017-10-22 LAB — HEMOGLOBIN A1C: HEMOGLOBIN A1C: 7.7 % — AB (ref 4.6–6.5)

## 2017-10-22 LAB — BASIC METABOLIC PANEL
BUN: 13 mg/dL (ref 6–23)
CALCIUM: 9.3 mg/dL (ref 8.4–10.5)
CHLORIDE: 100 meq/L (ref 96–112)
CO2: 25 meq/L (ref 19–32)
Creatinine, Ser: 0.42 mg/dL (ref 0.40–1.20)
GFR: 178.31 mL/min (ref >60.00)
GLUCOSE: 176 mg/dL — AB (ref 70–99)
POTASSIUM: 4.2 meq/L (ref 3.5–5.1)
SODIUM: 135 meq/L (ref 135–145)

## 2017-10-22 LAB — CBC WITH DIFFERENTIAL/PLATELET
BASOS ABS: 0 10*3/uL (ref 0.0–0.1)
Basophils Relative: 0.4 % (ref 0.0–3.0)
EOS ABS: 0.2 10*3/uL (ref 0.0–0.7)
Eosinophils Relative: 1.7 % (ref 0.0–5.0)
HEMATOCRIT: 36.5 % (ref 36.0–46.0)
Hemoglobin: 12 g/dL (ref 12.0–15.0)
LYMPHS ABS: 2.5 10*3/uL (ref 0.7–4.0)
LYMPHS PCT: 24.1 % (ref 12.0–46.0)
MCHC: 32.9 g/dL (ref 30.0–36.0)
MCV: 83.8 fl (ref 78.0–100.0)
Monocytes Absolute: 0.6 10*3/uL (ref 0.1–1.0)
Monocytes Relative: 6.2 % (ref 3.0–12.0)
NEUTROS ABS: 7 10*3/uL (ref 1.4–7.7)
NEUTROS PCT: 67.6 % (ref 43.0–77.0)
PLATELETS: 413 10*3/uL — AB (ref 150.0–400.0)
RBC: 4.36 Mil/uL (ref 3.87–5.11)
RDW: 14.6 % (ref 11.5–15.5)
WBC: 10.3 10*3/uL (ref 4.0–10.5)

## 2017-10-22 LAB — LIPID PANEL
CHOL/HDL RATIO: 5
Cholesterol: 167 mg/dL (ref 0–200)
HDL: 36.4 mg/dL — AB (ref >39.00)
NONHDL: 130.48
TRIGLYCERIDES: 247 mg/dL — AB (ref 0.0–149.0)
VLDL: 49.4 mg/dL — AB (ref 0.0–40.0)

## 2017-10-22 LAB — MICROALBUMIN / CREATININE URINE RATIO
Creatinine,U: 111.2 mg/dL
MICROALB/CREAT RATIO: 1.7 mg/g (ref 0.0–30.0)
Microalb, Ur: 1.9 mg/dL (ref 0.0–1.9)

## 2017-10-22 LAB — FERRITIN: FERRITIN: 17 ng/mL (ref 10.0–291.0)

## 2017-10-22 LAB — LDL CHOLESTEROL, DIRECT: LDL DIRECT: 108 mg/dL

## 2017-10-22 NOTE — Addendum Note (Signed)
Addended by: Leeanne Rio on: 10/22/2017 12:21 PM   Modules accepted: Orders

## 2017-10-22 NOTE — Addendum Note (Signed)
Addended by: Leeanne Rio on: 10/22/2017 08:53 AM   Modules accepted: Orders

## 2017-10-29 ENCOUNTER — Encounter: Payer: Self-pay | Admitting: *Deleted

## 2017-11-08 ENCOUNTER — Telehealth: Payer: Self-pay | Admitting: Internal Medicine

## 2017-11-08 NOTE — Telephone Encounter (Signed)
Called pt at work to see if she could come in to see Dr. Nicki Reaper on 11/12/2017 at 11am. Pt states the following; She does not need to see Dr. Nicki Reaper on 4/9. She is currently seeing Dr. Nicolasa Ducking (Dr. Nicki Reaper sent her there) and he has her on medication (pt did not day what the medication was.) that is giving her side effects.She does not want to see Dr. Nicki Reaper until this is  Figured out with Dr. Nicolasa Ducking. Pt will see Dr. Nicki Reaper at her  appointment on 12/13/2017.

## 2017-11-08 NOTE — Telephone Encounter (Signed)
Just sent you the result note back but here is the phone note from Santiago Glad who spoke with the patient.

## 2017-11-18 ENCOUNTER — Other Ambulatory Visit: Payer: Self-pay | Admitting: Internal Medicine

## 2017-12-13 ENCOUNTER — Ambulatory Visit: Payer: BC Managed Care – PPO | Admitting: Internal Medicine

## 2017-12-13 ENCOUNTER — Encounter

## 2017-12-13 ENCOUNTER — Encounter: Payer: Self-pay | Admitting: Internal Medicine

## 2017-12-13 ENCOUNTER — Ambulatory Visit: Payer: Self-pay | Admitting: Internal Medicine

## 2017-12-13 DIAGNOSIS — Z0289 Encounter for other administrative examinations: Secondary | ICD-10-CM

## 2017-12-15 ENCOUNTER — Other Ambulatory Visit: Payer: Self-pay | Admitting: Internal Medicine

## 2018-01-16 ENCOUNTER — Encounter: Payer: Self-pay | Admitting: Internal Medicine

## 2018-01-17 NOTE — Telephone Encounter (Signed)
My chart message sent to pt.

## 2018-01-27 ENCOUNTER — Other Ambulatory Visit: Payer: Self-pay | Admitting: Internal Medicine

## 2018-02-23 ENCOUNTER — Other Ambulatory Visit: Payer: Self-pay | Admitting: Internal Medicine

## 2018-03-24 ENCOUNTER — Other Ambulatory Visit: Payer: Self-pay | Admitting: Internal Medicine

## 2018-04-15 ENCOUNTER — Encounter

## 2018-04-15 ENCOUNTER — Ambulatory Visit: Payer: BC Managed Care – PPO | Admitting: Internal Medicine

## 2018-04-15 VITALS — BP 134/84 | HR 90 | Temp 97.7°F | Resp 18 | Wt 300.2 lb

## 2018-04-15 DIAGNOSIS — I10 Essential (primary) hypertension: Secondary | ICD-10-CM | POA: Diagnosis not present

## 2018-04-15 DIAGNOSIS — E119 Type 2 diabetes mellitus without complications: Secondary | ICD-10-CM | POA: Diagnosis not present

## 2018-04-15 DIAGNOSIS — F439 Reaction to severe stress, unspecified: Secondary | ICD-10-CM

## 2018-04-15 DIAGNOSIS — D473 Essential (hemorrhagic) thrombocythemia: Secondary | ICD-10-CM

## 2018-04-15 DIAGNOSIS — D509 Iron deficiency anemia, unspecified: Secondary | ICD-10-CM

## 2018-04-15 DIAGNOSIS — N920 Excessive and frequent menstruation with regular cycle: Secondary | ICD-10-CM

## 2018-04-15 DIAGNOSIS — D75839 Thrombocytosis, unspecified: Secondary | ICD-10-CM

## 2018-04-15 DIAGNOSIS — E7800 Pure hypercholesterolemia, unspecified: Secondary | ICD-10-CM | POA: Diagnosis not present

## 2018-04-15 LAB — TSH: TSH: 2.89 u[IU]/mL (ref 0.35–4.50)

## 2018-04-15 LAB — CBC WITH DIFFERENTIAL/PLATELET
BASOS PCT: 0.5 %
Basophils Absolute: 60 cells/uL (ref 0–200)
Eosinophils Absolute: 156 cells/uL (ref 15–500)
Eosinophils Relative: 1.3 %
HEMATOCRIT: 41.1 % (ref 35.0–45.0)
Hemoglobin: 13.4 g/dL (ref 11.7–15.5)
LYMPHS ABS: 2580 {cells}/uL (ref 850–3900)
MCH: 26.7 pg — ABNORMAL LOW (ref 27.0–33.0)
MCHC: 32.6 g/dL (ref 32.0–36.0)
MCV: 82 fL (ref 80.0–100.0)
MPV: 10 fL (ref 7.5–12.5)
Monocytes Relative: 5 %
NEUTROS PCT: 71.7 %
Neutro Abs: 8604 cells/uL — ABNORMAL HIGH (ref 1500–7800)
PLATELETS: 482 10*3/uL — AB (ref 140–400)
RBC: 5.01 10*6/uL (ref 3.80–5.10)
RDW: 13.8 % (ref 11.0–15.0)
TOTAL LYMPHOCYTE: 21.5 %
WBC: 12 10*3/uL — ABNORMAL HIGH (ref 3.8–10.8)
WBCMIX: 600 {cells}/uL (ref 200–950)

## 2018-04-15 LAB — LIPID PANEL
CHOL/HDL RATIO: 5
Cholesterol: 197 mg/dL (ref 0–200)
HDL: 41 mg/dL (ref >39.00)
NONHDL: 155.7
Triglycerides: 317 mg/dL — ABNORMAL HIGH (ref 0.0–149.0)
VLDL: 63.4 mg/dL — AB (ref 0.0–40.0)

## 2018-04-15 LAB — LDL CHOLESTEROL, DIRECT: Direct LDL: 134 mg/dL

## 2018-04-15 LAB — BASIC METABOLIC PANEL
BUN: 16 mg/dL (ref 6–23)
CO2: 25 mEq/L (ref 19–32)
CREATININE: 0.59 mg/dL (ref 0.40–1.20)
Calcium: 9.7 mg/dL (ref 8.4–10.5)
Chloride: 98 mEq/L (ref 96–112)
GFR: 120.16 mL/min (ref >60.00)
Glucose, Bld: 181 mg/dL — ABNORMAL HIGH (ref 70–99)
Potassium: 4.2 mEq/L (ref 3.5–5.1)
Sodium: 136 mEq/L (ref 135–145)

## 2018-04-15 LAB — HEPATIC FUNCTION PANEL
ALT: 21 U/L (ref 0–35)
AST: 15 U/L (ref 0–37)
Albumin: 4.3 g/dL (ref 3.5–5.2)
Alkaline Phosphatase: 67 U/L (ref 39–117)
BILIRUBIN TOTAL: 0.3 mg/dL (ref 0.2–1.2)
Bilirubin, Direct: 0 mg/dL (ref 0.0–0.3)
Total Protein: 7.7 g/dL (ref 6.0–8.3)

## 2018-04-15 LAB — POCT GLYCOSYLATED HEMOGLOBIN (HGB A1C): HEMOGLOBIN A1C: 7.3 % — AB (ref 4.0–5.6)

## 2018-04-15 LAB — FERRITIN: FERRITIN: 17.4 ng/mL (ref 10.0–291.0)

## 2018-04-15 NOTE — Patient Instructions (Signed)
Lotrimin cream- apply to affected area twice a day  Dr Ginny [facility] psychiatrist

## 2018-04-15 NOTE — Progress Notes (Signed)
Patient ID: Lindsey Strickland, female   DOB: 1976-03-05, 42 y.o.   MRN: [id number]   Subjective:    Patient ID: Lindsey Strickland, female    DOB: 1976-05-29, 42 y.o.   MRN: [id number]  HPI  Patient here for a scheduled follow up.  Has been previously seeing Dr Nicolasa Ducking.  Did not tolerate zoloft and celexa.  Still seeing her counselor.  This is helping.  Still with increased stress, anxiety and depression.  Is doing better now.  Wants to see a different psychiatrist.  She will call me with the name of psychiatrist she wants to see.  Discussed importance of diet and exercise.  Discussed the need for weight loss.  Has seen GI.  Still with some persistent loose stool.  She will call to schedule f/u with GI.  No chest pain.  Breathing stable. No acid reflux.  No abdominal pain.  Has been seeing gyn.  Still with some bleeding issues.  Have discussed hysterectomy.  Has f/u planned with gyn.     Past Medical History:  Diagnosis Date  . Diabetes mellitus ([place])   . Environmental allergies   . GERD (gastroesophageal reflux disease)   . Hypercholesterolemia   . Hypertension    History reviewed. No pertinent surgical history. Family History  Problem Relation Age of Onset  . Hypertension Mother   . Diabetes Mother   . Diverticulosis Mother   . Hypercholesterolemia Father   . Hypertension Father   . Heart disease Maternal Grandfather   . Diverticulosis Paternal Grandmother   . Hyperlipidemia Paternal Grandmother   . Heart failure Paternal Grandmother    Social History   Socioeconomic History  . Marital status: Single    Spouse name: Not on file  . Number of children: 2  . Years of education: Not on file  . Highest education level: Not on file  Occupational History    Employer: [employer]  . Financial resource strain: Not on file  . Food insecurity:    Worry: Not on file    Inability: Not on file  . Transportation needs:    Medical: Not on file    Non-medical: Not on  file  Tobacco Use  . Smoking status: Former Research scientist (life sciences)  . Smokeless tobacco: Never Used  Substance and Sexual Activity  . Alcohol use: Yes    Alcohol/week: 0.0 standard drinks    Comment: rare  . Drug use: No  . Sexual activity: Not on file  Lifestyle  . Physical activity:    Days per week: Not on file    Minutes per session: Not on file  . Stress: Not on file  Relationships  . Social connections:    Talks on phone: Not on file    Gets together: Not on file    Attends religious service: Not on file    Active member of club or organization: Not on file    Attends meetings of clubs or organizations: Not on file    Relationship status: Not on file  Other Topics Concern  . Not on file  Social History Narrative  . Not on file    Outpatient Encounter Medications as of 04/15/2018  Medication Sig  . albuterol (PROVENTIL HFA;VENTOLIN HFA) 108 (90 Base) MCG/ACT inhaler Inhale 2 puffs into the lungs every 6 (six) hours as needed for wheezing.  Marland Kitchen amLODipine (NORVASC) 10 MG tablet TAKE ONE-HALF TABLET BY MOUTH TWICE DAILY  . ferrous sulfate 325 (65 FE) MG tablet  Take 325 mg by mouth daily with breakfast.  . fluticasone (FLONASE) 50 MCG/ACT nasal spray Place 2 sprays into both nostrils daily.  Marland Kitchen glucose blood test strip 1 each by Other route 2 (two) times daily. Use as instructed, use one strip twice a day  . Lancets MISC by Does not apply route. Use 1 lancets three times a day  . loratadine (ALAVERT) 10 MG tablet Take 10 mg by mouth daily.  Marland Kitchen losartan (COZAAR) 100 MG tablet Take 1 tablet (100 mg total) by mouth daily. Must keep next appt  . metFORMIN (GLUCOPHAGE-XR) 500 MG 24 hr tablet TAKE 2 TABLETS BY MOUTH TWICE DAILY  . metoprolol succinate (TOPROL-XL) 100 MG 24 hr tablet TAKE ONE-HALF TABLET BY MOUTH TWICE DAILY  . pravastatin (PRAVACHOL) 40 MG tablet TAKE 1 TABLET BY MOUTH ONCE DAILY -  MUST  KEEP  NEXT  APPT  . Probiotic Product (ALIGN PO) Take by mouth daily.  . ranitidine (ZANTAC)  150 MG tablet TAKE 1 TABLET BY MOUTH ONCE DAILY  . [DISCONTINUED] amLODipine (NORVASC) 5 MG tablet TAKE ONE TABLET BY MOUTH TWICE DAILY  . [DISCONTINUED] cefdinir (OMNICEF) 300 MG capsule Take 1 capsule (300 mg total) by mouth 2 (two) times daily.  . [DISCONTINUED] metFORMIN (GLUMETZA) 1000 MG (MOD) 24 hr tablet Take 1 tablet (1,000 mg total) by mouth 2 (two) times daily with a meal.   No facility-administered encounter medications on file as of 04/15/2018.     Review of Systems  Constitutional: Negative for appetite change and unexpected weight change.  HENT: Negative for congestion and sinus pressure.   Respiratory: Negative for cough, chest tightness and shortness of breath.   Cardiovascular: Negative for chest pain, palpitations and leg swelling.  Gastrointestinal: Negative for abdominal pain, nausea and vomiting.  Genitourinary: Negative for difficulty urinating and dysuria.  Musculoskeletal: Negative for joint swelling and myalgias.  Skin: Negative for color change and rash.  Neurological: Negative for dizziness, light-headedness and headaches.  Psychiatric/Behavioral: Negative for agitation and dysphoric mood.       Objective:     Blood pressure rechecked by me:  134/84  Physical Exam  Constitutional: She appears well-developed and well-nourished. No distress.  HENT:  Nose: Nose normal.  Mouth/Throat: Oropharynx is clear and moist.  Neck: Neck supple. No thyromegaly present.  Cardiovascular: Normal rate and regular rhythm.  Pulmonary/Chest: Breath sounds normal. No respiratory distress. She has no wheezes.  Abdominal: Soft. Bowel sounds are normal. There is no tenderness.  Musculoskeletal: She exhibits no edema or tenderness.  Lymphadenopathy:    She has no cervical adenopathy.  Skin: No rash noted. No erythema.  Psychiatric: She has a normal mood and affect. Her behavior is normal.    BP 134/84   Pulse 90   Temp 97.7 F (36.5 C) (Oral)   Resp 18   Wt (!) 300 lb  3.2 oz (136.2 kg)   SpO2 98%   BMI 47.02 kg/m  Wt Readings from Last 3 Encounters:  04/15/18 (!) 300 lb 3.2 oz (136.2 kg)  07/18/17 (!) 312 lb (141.5 kg)  11/09/16 (!) 314 lb 9.6 oz (142.7 kg)     Lab Results  Component Value Date   WBC 12.0 (H) 04/15/2018   HGB 13.4 04/15/2018   HCT 41.1 04/15/2018   PLT 482 (H) 04/15/2018   GLUCOSE 181 (H) 04/15/2018   CHOL 197 04/15/2018   TRIG 317.0 (H) 04/15/2018   HDL 41.00 04/15/2018   LDLDIRECT 134.0 04/15/2018   LDLCALC 114 (  H) 12/15/2015   ALT 21 04/15/2018   AST 15 04/15/2018   NA 136 04/15/2018   K 4.2 04/15/2018   CL 98 04/15/2018   CREATININE 0.59 04/15/2018   BUN 16 04/15/2018   CO2 25 04/15/2018   TSH 2.89 04/15/2018   HGBA1C 7.3 (A) 04/15/2018   MICROALBUR 1.9 10/22/2017    Dg Chest 2 View  Result Date: 01/05/2016 CLINICAL DATA:  Patient with chest pain for multiple days. EXAM: CHEST  2 VIEW COMPARISON:  Chest radiograph 07/03/2013. FINDINGS: The heart size and mediastinal contours are within normal limits. Both lungs are clear. The visualized skeletal structures are unremarkable. IMPRESSION: No active cardiopulmonary disease. Electronically Signed   By: Lovey Newcomer M.D.   On: 01/05/2016 08:57       Assessment & Plan:   Problem List Items Addressed This Visit    Diabetes mellitus ([place]) - Primary    Discussed diet and exercise and weight loss.  Follow met b and a1c.        Relevant Orders   Basic metabolic panel (Completed)   POCT HgB A1C (Completed)   Hypercholesterolemia    On pravastatin.  Low cholesterol diet and exercise.  Follow lipid panel and liver function tests.        Relevant Orders   Hepatic function panel (Completed)   Lipid panel (Completed)   Hypertension    Blood pressure on recheck improved.  Continue current medication regimen.  Follow pressures.  Follow metabolic panel.       Relevant Orders   TSH (Completed)   Iron deficiency anemia    Follow cbc and ferritin.        Menorrhagia      S/p D&C, etc.  Has mirena IUD.  Still with bleeding issues.  Plans to f/u with gyn.       Severe obesity (BMI >= 40) ([facility])    Diet and exercise.  Follow.        Stress    Increased stress.  Also with anxiety and depression.  Has been seeing psychiatry.  Also seeing a Social worker.  Did not tolerate zoloft or celexa.  Will call with name of preferred psychiatrist.  Doing better.        Thrombocytosis ([facility])    Recheck cbc.       Relevant Orders   Ferritin (Completed)   CBC w/Diff (Completed)       Einar Pheasant, MD

## 2018-04-17 ENCOUNTER — Telehealth: Payer: Self-pay | Admitting: Internal Medicine

## 2018-04-17 NOTE — Telephone Encounter (Signed)
Pt given results per notes of Dr. Nicki Reaper on 04/16/18, patient verbalized understanding and is agreeable to see the pharmacist and hematology. Unable to document in result note due to result note not being routed to [facility].

## 2018-04-17 NOTE — Telephone Encounter (Signed)
PEC unable to result note. Patient notified of results.

## 2018-04-18 ENCOUNTER — Other Ambulatory Visit: Payer: Self-pay | Admitting: Internal Medicine

## 2018-04-18 DIAGNOSIS — D75839 Thrombocytosis, unspecified: Secondary | ICD-10-CM

## 2018-04-18 DIAGNOSIS — D473 Essential (hemorrhagic) thrombocythemia: Secondary | ICD-10-CM

## 2018-04-18 NOTE — Telephone Encounter (Signed)
Order placed for hematology referral.  Make sure gets appt scheduled with catie.

## 2018-04-18 NOTE — Progress Notes (Signed)
Order placed for hematology referral.  

## 2018-04-18 NOTE — Telephone Encounter (Signed)
Patient is agreeable to referral to hematology and appt with pharmacist. I will schedule appt with Catie.

## 2018-04-19 ENCOUNTER — Encounter: Payer: Self-pay | Admitting: Internal Medicine

## 2018-04-19 NOTE — Assessment & Plan Note (Signed)
Discussed diet and exercise and weight loss.  Follow met b and a1c.

## 2018-04-19 NOTE — Assessment & Plan Note (Signed)
Blood pressure on recheck improved.  Continue current medication regimen.  Follow pressures.  Follow metabolic panel.   

## 2018-04-19 NOTE — Assessment & Plan Note (Signed)
Diet and exercise.  Follow.  

## 2018-04-19 NOTE — Assessment & Plan Note (Signed)
Recheck cbc.  

## 2018-04-19 NOTE — Assessment & Plan Note (Signed)
Follow cbc and ferritin.  

## 2018-04-19 NOTE — Assessment & Plan Note (Signed)
S/p D&C, etc.  Has mirena IUD.  Still with bleeding issues.  Plans to f/u with gyn.

## 2018-04-19 NOTE — Assessment & Plan Note (Addendum)
Increased stress.  Also with anxiety and depression.  Has been seeing psychiatry.  Also seeing a Social worker.  Did not tolerate zoloft or celexa.  Will call with name of preferred psychiatrist.  Doing better.

## 2018-04-19 NOTE — Assessment & Plan Note (Signed)
On pravastatin.  Low cholesterol diet and exercise.  Follow lipid panel and liver function tests.   

## 2018-04-22 NOTE — Telephone Encounter (Signed)
Left message letting patient know to call and schedule appt with Catie

## 2018-04-30 NOTE — Telephone Encounter (Signed)
LMTCB

## 2018-05-03 NOTE — Progress Notes (Signed)
[facility] day:  05/05/2018  Chief Complaint: Lindsey Strickland is a 42 y.o. female with thrombocytosis who is referred in consultation by Dr. Einar Pheasant for assessment and management.  HPI:  The patient notes a platelet count that has gone up and down since 2014.  She notes most recently her platelet count has been consistently above 400,000.  She describes a history of excessive menstrual bleeding for years.  In the recent past, menses has somewhat improved. She has varying menstrual cycles. Menses will go from very light to extremely heavy. Last cycle was heavy. She experiences menstrual cycles every 28 to 30 days, last 7-9 days, with heavy flow x 2-3 days. Patient wears 2 pads at a time, and requires 10-12 pads a day. She experiences associated vertigo symptoms. Patient has had several gynecological procedures, including the placement of an IUD. She is not a candidate for uterine ablation due to her history of endometrial hyperplasia.   CBCs are available back to 01/26/2013.  Platelet count has ranged between 363,000 - 429,000.  Hemoglobin has ranged between 11.5 - 12.3.  MCV has ranged from 79.2 - 83.8.  Ferritin has been followed:  6.5 on 02/10/2015, 6.4 on 08/05/2015, 9.8 on 12/15/2015, 10.5 on 04/02/2016, 12.5 on 11/09/2016, 17 on 10/22/2017, and 17.4 on 04/15/2018.  CBC on 04/15/2018 revealed a hematocrit of 41.1, hemoglobin 13.4, MCV 82, platelets 482,000, WBC 12,000 with an ANC of 8604.    Differential was unremarkable.  Patient denies obvious gastrointestinal bleeding; no hematochezia, melena. No gross hematuria. Patient maintains a diet rich in iron. She indicates that she eats meat and on a consistent basis. She eats vegetables, however they are not dark green leafy vegetables. She denies ice pica and restless legs.   She has lost approximately 12 pounds recently. She is intentionally trying to lose weight. Patient has night sweats,  however attributes this to multiple blankets and pillows in her bed.  She has intermittent headaches, which generally do not require intervention. No chest pain or shortness of breath. She has a PMH (+) for anxiety. Patient has chronic diarrhea. She was seen earlier today by gastroenterology Jerelene Redden, NP) and was scheduled for EGD and colonoscopy.   Patient has been experiencing diffuse muscle/joint aches x 6 months. Patient bruises easily with minimal trauma. Patient has a history of neuropathy in her hands and feet.   Patient advises that she maintains an adequate appetite. She is eating well. Weight today is 298 lb 7 oz (135.4 kg). Patient takes an oral iron supplement once a day. Shenotes that she does not take the oral iron supplement with a source of vitamin C.  Patient denies pain in the clinic today.  Patient denies any family history that is significant for any type of oncologic or hematologic disorder.    Past Medical History:  Diagnosis Date  . Atypical endometrial hyperplasia   . Diabetes mellitus ([place])   . Environmental allergies   . Generalized anxiety disorder   . GERD (gastroesophageal reflux disease)   . Hypercholesterolemia   . Hypertension   . Insomnia   . Major depressive disorder     No past surgical history on file.  Family History  Problem Relation Age of Onset  . Hypertension Mother   . Diabetes Mother   . Diverticulosis Mother   . Hypercholesterolemia Father   . Hypertension Father   . Cancer Father   . Heart disease Maternal Grandfather   . Diverticulosis  Paternal Grandmother   . Hyperlipidemia Paternal Grandmother   . Heart failure Paternal Grandmother   . Cancer Maternal Grandmother     Social History:  reports that she has quit smoking. She has never used smokeless tobacco. She reports that she drinks alcohol. She reports that she does not use drugs.  Lives in [place]. Patient has 2 children (age 18 and 51). She is in school for an AA degree at  [employer].  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 Patient denies known exposures to radiation on toxins.  She lives in [place].  The patient is accompanied by boyfriend of 42 years, Lindsey Strickland,  today.  Allergies:  Allergies  Allergen Reactions  . Tetracycline Other (See Comments)    Other reaction(s): Unknown   . Augmentin [Amoxicillin-Pot Clavulanate]     GI intolerance   . Hctz [Hydrochlorothiazide]   . Lisinopril     Other reaction(s): Other (See Comments) cough  . Other     Mycins  . Prednisone     Chest pain  . Ciprofloxacin Nausea Only and Palpitations    Current Medications: Current Outpatient Medications  Medication Sig Dispense Refill  . albuterol (PROVENTIL HFA;VENTOLIN HFA) 108 (90 Base) MCG/ACT inhaler Inhale 2 puffs into the lungs every 6 (six) hours as needed for wheezing. 1 Inhaler 1  . amLODipine (NORVASC) 10 MG tablet TAKE ONE-HALF TABLET BY MOUTH TWICE DAILY 90 tablet 3  . ferrous sulfate 325 (65 FE) MG tablet Take 325 mg by mouth daily with breakfast.    . fluticasone (FLONASE) 50 MCG/ACT nasal spray Place 2 sprays into both nostrils daily. 16 g 3  . glucose blood test strip 1 each by Other route 2 (two) times daily. Use as instructed, use one strip twice a day    . Lancets MISC by Does not apply route. Use 1 lancets three times a day    . loratadine (ALAVERT) 10 MG tablet Take 10 mg by mouth daily.    Marland Kitchen losartan (COZAAR) 100 MG tablet Take 1 tablet (100 mg total) by mouth daily. Must keep next appt 90 tablet 1  . metFORMIN (GLUCOPHAGE-XR) 500 MG 24 hr tablet TAKE 2 TABLETS BY MOUTH TWICE DAILY 360 tablet 1  . metoprolol succinate (TOPROL-XL) 100 MG 24 hr tablet TAKE ONE-HALF TABLET BY MOUTH TWICE DAILY 90 tablet 3  . pravastatin (PRAVACHOL) 40 MG tablet TAKE 1 TABLET BY MOUTH ONCE DAILY -  MUST  KEEP  NEXT  APPT 90 tablet 0  . Probiotic Product (ALIGN PO) Take by mouth daily.    . ranitidine (ZANTAC) 150 MG  tablet TAKE 1 TABLET BY MOUTH ONCE DAILY 90 tablet 0   No current facility-administered medications for this visit.     Review of Systems  Constitutional: Positive for diaphoresis (at night; ?? multiple pillows and blankets), malaise/fatigue and weight loss (12 pound intentional weight loss). Negative for fever.  HENT: Negative.   Eyes: Negative.   Respiratory: Negative for cough, hemoptysis, sputum production and shortness of breath.   Cardiovascular: Negative for chest pain, palpitations, orthopnea, leg swelling and PND.       Hypertensive in clinic at 173/100  Gastrointestinal: Positive for diarrhea (chronic). Negative for abdominal pain, blood in stool, constipation, melena, nausea and vomiting.  Genitourinary: Negative for dysuria, frequency, hematuria and urgency.       Varying menstrual cycles  Musculoskeletal: Positive for joint pain and myalgias. Negative for back pain and falls.  Skin: Negative for itching and rash.  Neurological: Positive for sensory change (neuropathy in hands and feet) and headaches. Negative for dizziness, tremors and weakness.  Endo/Heme/Allergies: Bruises/bleeds easily.  Psychiatric/Behavioral: Negative for depression, memory loss and suicidal ideas. The patient is nervous/anxious. The patient does not have insomnia.   All other systems reviewed and are negative.  Performance status (ECOG): 1 - Symptomatic but completely ambulatory  Vital Signs: BP (!) 173/100 (BP Location: Right Arm, Patient Position: Sitting)   Pulse (!) 109   Temp 98.4 F (36.9 C) (Oral) Comment: 99.6 Tympanic  Resp 20   Ht '5\' 7"'$  (1.702 m)   Wt 298 lb 7 oz (135.4 kg)   BMI 46.74 kg/m   Physical Exam  Constitutional: She is oriented to person, place, and time and well-developed, well-nourished, and in no distress.  Heavy set woman sitting comfortably in the exam room in no acute distress.  HENT:  Head: Normocephalic and atraumatic.  Short brown hair.  Eyes: Pupils are equal,  round, and reactive to light. EOM are normal. No scleral icterus.  Brown eyes.  Neck: Normal range of motion. Neck supple. No tracheal deviation present. No thyromegaly present.  Cardiovascular: Regular rhythm and normal heart sounds. Tachycardia present. Exam reveals no gallop and no friction rub.  No murmur heard. Pulmonary/Chest: Effort normal and breath sounds normal. No respiratory distress. She has no wheezes. She has no rales.  Abdominal: Soft. Bowel sounds are normal. She exhibits no distension and no mass. There is no tenderness. There is no rebound and no guarding.  Fully round.  No appreciable hepatosplenomegaly.  Musculoskeletal: Normal range of motion. She exhibits no edema or tenderness.  Lymphadenopathy:    She has no cervical adenopathy.    She has no axillary adenopathy.       Right: No inguinal and no supraclavicular adenopathy present.       Left: No inguinal and no supraclavicular adenopathy present.  Neurological: She is alert and oriented to person, place, and time.  Skin: Skin is warm and dry. No rash noted. No erythema.  Multiple scattered tiny SQ nodules on abdominal wall.  Psychiatric: Mood, affect and judgment normal.  Nursing note and vitals reviewed.   Appointment on 05/05/2018  Component Date Value Ref Range Status  . Sodium 05/05/2018 136  135 - 145 mmol/L Final  . Potassium 05/05/2018 4.0  3.5 - 5.1 mmol/L Final  . Chloride 05/05/2018 102  98 - 111 mmol/L Final  . CO2 05/05/2018 21* 22 - 32 mmol/L Final  . Glucose, Bld 05/05/2018 158* 70 - 99 mg/dL Final  . BUN 05/05/2018 14  6 - 20 mg/dL Final  . Creatinine, Ser 05/05/2018 0.60  0.44 - 1.00 mg/dL Final  . Calcium 05/05/2018 9.4  8.9 - 10.3 mg/dL Final  . Total Protein 05/05/2018 8.0  6.5 - 8.1 g/dL Final  . Albumin 05/05/2018 4.2  3.5 - 5.0 g/dL Final  . AST 05/05/2018 23  15 - 41 U/L Final  . ALT 05/05/2018 20  0 - 44 U/L Final  . Alkaline Phosphatase 05/05/2018 71  38 - 126 U/L Final  . Total  Bilirubin 05/05/2018 0.4  0.3 - 1.2 mg/dL Final  . GFR calc non Af Amer 05/05/2018 >60  >60 mL/min Final  . GFR calc Af Amer 05/05/2018 >60  >60 mL/min Final   Comment: (NOTE) The eGFR has been calculated using the CKD EPI equation. This calculation has not been validated in all clinical situations. eGFR's persistently <60  mL/min signify possible Chronic Kidney Disease.   Georgiann Hahn gap 05/05/2018 13  5 - 15 Final   Performed at [facility], [address] [place], [place] [address]  . Prothrombin Time 05/05/2018 12.4  11.4 - 15.2 seconds Final  . INR 05/05/2018 0.93   Final   Performed at [facility], [address] [place], [place] [address]    Assessment:  JOELYNN DUST is a 42 y.o. female with thrombocytosis likely secondary to iron deficiency anemia.  Menses vary between normal and heavy.  Diet is adequate and rich in iron.   Ferritin has been followed:  6.5 on 02/10/2015, 6.4 on 08/05/2015, 9.8 on 12/15/2015, 10.5 on 04/02/2016, 12.5 on 11/09/2016, 17 on 10/22/2017, and 17.4 on 04/15/2018.  Ferritin goal is 100.  Symptomatically, she is fatigued. She experiences episodes of nocturnal diaphoresis (under blankets).  No fevers. She has recently lost 12 pounds intentionally. She describes varying (light to heavy) menstrual cycles.  Exam is grossly unremarkable.   Plan: 1. Labs today:  CMP, ferritin, iron studies, CRP, urinalysis, von Willebrands, platelet function assay, PT. 2. Thrombocytosis     Differential includes primary causes (essential thrombocythemia-ET) and reactive causes (inflammatory causes, iron deficiency).    Etiology likely secondary to iron deficiency.    Discuss correcting iron deficiency to determine if platelet count will normalize. She is already taking oral iron daily. Will increase to BID with a source of vitamin C.  Given that patient has been on oral iron "for years", suspect absorption issue. Discuss potential need for intravenous iron  replacement.   Discuss further work-up of thrombocytosis if iron deficiency is corrected and thrombocytosis persists.  Preauthorization for Venofer   3. Menorrhagia  Discuss r/o bleeding diathesis with von Willebrand's panel, PT, platelet function assay. 4. RTC in 1 month for MD assessment and labs (CBC with diff, ferritin - day before), review of work up, and +/- Venofer.   Honor Loh, NP  05/05/2018, 2:50 PM   I saw and evaluated the patient, participating in the key portions of the service and reviewing pertinent diagnostic studies and records.  I reviewed the nurse practitioner's note and agree with the findings and the plan.  The assessment and plan were discussed with the patient.  Multiple questions were asked by the patient and answered.   Nolon Stalls, MD 05/05/2018,2:50 PM

## 2018-05-05 ENCOUNTER — Inpatient Hospital Stay: Payer: BC Managed Care – PPO | Attending: Hematology and Oncology | Admitting: Hematology and Oncology

## 2018-05-05 ENCOUNTER — Encounter: Payer: Self-pay | Admitting: Hematology and Oncology

## 2018-05-05 ENCOUNTER — Inpatient Hospital Stay: Payer: BC Managed Care – PPO

## 2018-05-05 VITALS — BP 173/100 | HR 109 | Temp 98.4°F | Resp 20 | Ht 67.0 in | Wt 298.4 lb

## 2018-05-05 DIAGNOSIS — D500 Iron deficiency anemia secondary to blood loss (chronic): Secondary | ICD-10-CM | POA: Diagnosis not present

## 2018-05-05 DIAGNOSIS — R61 Generalized hyperhidrosis: Secondary | ICD-10-CM | POA: Insufficient documentation

## 2018-05-05 DIAGNOSIS — D509 Iron deficiency anemia, unspecified: Secondary | ICD-10-CM

## 2018-05-05 DIAGNOSIS — R5381 Other malaise: Secondary | ICD-10-CM | POA: Diagnosis not present

## 2018-05-05 DIAGNOSIS — G629 Polyneuropathy, unspecified: Secondary | ICD-10-CM | POA: Diagnosis not present

## 2018-05-05 DIAGNOSIS — R51 Headache: Secondary | ICD-10-CM | POA: Diagnosis not present

## 2018-05-05 DIAGNOSIS — D473 Essential (hemorrhagic) thrombocythemia: Secondary | ICD-10-CM

## 2018-05-05 DIAGNOSIS — Z79899 Other long term (current) drug therapy: Secondary | ICD-10-CM | POA: Diagnosis not present

## 2018-05-05 DIAGNOSIS — Z809 Family history of malignant neoplasm, unspecified: Secondary | ICD-10-CM

## 2018-05-05 DIAGNOSIS — Z975 Presence of (intrauterine) contraceptive device: Secondary | ICD-10-CM | POA: Diagnosis not present

## 2018-05-05 DIAGNOSIS — D75839 Thrombocytosis, unspecified: Secondary | ICD-10-CM

## 2018-05-05 DIAGNOSIS — N920 Excessive and frequent menstruation with regular cycle: Secondary | ICD-10-CM | POA: Diagnosis not present

## 2018-05-05 DIAGNOSIS — Z87891 Personal history of nicotine dependence: Secondary | ICD-10-CM | POA: Diagnosis not present

## 2018-05-05 DIAGNOSIS — R7989 Other specified abnormal findings of blood chemistry: Secondary | ICD-10-CM

## 2018-05-05 DIAGNOSIS — Z7984 Long term (current) use of oral hypoglycemic drugs: Secondary | ICD-10-CM | POA: Diagnosis not present

## 2018-05-05 DIAGNOSIS — E119 Type 2 diabetes mellitus without complications: Secondary | ICD-10-CM | POA: Insufficient documentation

## 2018-05-05 DIAGNOSIS — G4700 Insomnia, unspecified: Secondary | ICD-10-CM | POA: Insufficient documentation

## 2018-05-05 DIAGNOSIS — I10 Essential (primary) hypertension: Secondary | ICD-10-CM | POA: Insufficient documentation

## 2018-05-05 LAB — PROTIME-INR
INR: 0.93
Prothrombin Time: 12.4 seconds (ref 11.4–15.2)

## 2018-05-05 LAB — COMPREHENSIVE METABOLIC PANEL
ALBUMIN: 4.2 g/dL (ref 3.5–5.0)
ALK PHOS: 71 U/L (ref 38–126)
ALT: 20 U/L (ref 0–44)
AST: 23 U/L (ref 15–41)
Anion gap: 13 (ref 5–15)
BILIRUBIN TOTAL: 0.4 mg/dL (ref 0.3–1.2)
BUN: 14 mg/dL (ref 6–20)
CALCIUM: 9.4 mg/dL (ref 8.9–10.3)
CO2: 21 mmol/L — ABNORMAL LOW (ref 22–32)
Chloride: 102 mmol/L (ref 98–111)
Creatinine, Ser: 0.6 mg/dL (ref 0.44–1.00)
GFR calc Af Amer: >60 mL/min (ref >60)
GFR calc non Af Amer: >60 mL/min (ref >60)
GLUCOSE: 158 mg/dL — AB (ref 70–99)
Potassium: 4 mmol/L (ref 3.5–5.1)
Sodium: 136 mmol/L (ref 135–145)
Total Protein: 8 g/dL (ref 6.5–8.1)

## 2018-05-05 LAB — FERRITIN: FERRITIN: 20 ng/mL (ref 11–307)

## 2018-05-05 LAB — IRON AND TIBC
Iron: 48 ug/dL (ref 28–170)
SATURATION RATIOS: 10 % — AB (ref 10.4–31.8)
TIBC: 462 ug/dL — ABNORMAL HIGH (ref 250–450)
UIBC: 414 ug/dL

## 2018-05-05 LAB — PLATELET FUNCTION ASSAY: Collagen / Epinephrine: 127 seconds (ref 0–193)

## 2018-05-05 NOTE — Progress Notes (Signed)
Patient here as new evaluation regarding thrombocytosis.  Referred by Dr. Nicki Reaper.  Patient accompanied by her partner, Lindsey Strickland.

## 2018-05-06 ENCOUNTER — Encounter: Payer: Self-pay | Admitting: Hematology and Oncology

## 2018-05-06 LAB — VON WILLEBRAND PANEL
COAGULATION FACTOR VIII: 146 % — AB (ref 56–140)
Ristocetin Co-factor, Plasma: 112 % (ref 50–200)
VON WILLEBRAND ANTIGEN, PLASMA: 154 % (ref 50–200)

## 2018-05-06 LAB — COAG STUDIES INTERP REPORT

## 2018-05-06 NOTE — Telephone Encounter (Signed)
Letter mailed for patient to call office and schedule

## 2018-05-12 ENCOUNTER — Other Ambulatory Visit: Payer: Self-pay | Admitting: Internal Medicine

## 2018-05-15 ENCOUNTER — Encounter: Payer: Self-pay | Admitting: Hematology and Oncology

## 2018-05-15 ENCOUNTER — Encounter: Payer: Self-pay | Admitting: Internal Medicine

## 2018-05-17 ENCOUNTER — Other Ambulatory Visit
Admission: RE | Admit: 2018-05-17 | Discharge: 2018-05-17 | Disposition: A | Discharge status: Home / Self Care | Payer: BC Managed Care – PPO | Source: Ambulatory Visit | Attending: Nurse Practitioner | Admitting: Nurse Practitioner

## 2018-05-17 DIAGNOSIS — R197 Diarrhea, unspecified: Secondary | ICD-10-CM | POA: Insufficient documentation

## 2018-05-17 LAB — C DIFFICILE QUICK SCREEN W PCR REFLEX
C Diff antigen: NEGATIVE
C Diff interpretation: NOT DETECTED
C Diff toxin: NEGATIVE

## 2018-05-17 LAB — GASTROINTESTINAL PANEL BY PCR, STOOL (REPLACES STOOL CULTURE)

## 2018-05-17 LAB — LACTOFERRIN, FECAL, QUALITATIVE: Lactoferrin, Fecal, Qual: NEGATIVE

## 2018-05-26 ENCOUNTER — Other Ambulatory Visit: Payer: Self-pay | Admitting: Internal Medicine

## 2018-05-28 ENCOUNTER — Ambulatory Visit: Payer: BC Managed Care – PPO | Admitting: Anesthesiology

## 2018-05-28 ENCOUNTER — Encounter: Payer: Self-pay | Admitting: *Deleted

## 2018-05-28 ENCOUNTER — Ambulatory Visit
Admission: RE | Admit: 2018-05-28 | Discharge: 2018-05-28 | Disposition: A | Discharge status: Home / Self Care | Payer: BC Managed Care – PPO | Source: Ambulatory Visit | Attending: Unknown Physician Specialty | Admitting: Unknown Physician Specialty

## 2018-05-28 ENCOUNTER — Encounter
Admission: RE | Disposition: A | Discharge status: Home / Self Care | Payer: Self-pay | Source: Ambulatory Visit | Attending: Unknown Physician Specialty

## 2018-05-28 ENCOUNTER — Other Ambulatory Visit: Payer: Self-pay

## 2018-05-28 DIAGNOSIS — E119 Type 2 diabetes mellitus without complications: Secondary | ICD-10-CM | POA: Diagnosis not present

## 2018-05-28 DIAGNOSIS — G4700 Insomnia, unspecified: Secondary | ICD-10-CM | POA: Insufficient documentation

## 2018-05-28 DIAGNOSIS — E7800 Pure hypercholesterolemia, unspecified: Secondary | ICD-10-CM | POA: Insufficient documentation

## 2018-05-28 DIAGNOSIS — K529 Noninfective gastroenteritis and colitis, unspecified: Secondary | ICD-10-CM | POA: Diagnosis not present

## 2018-05-28 DIAGNOSIS — I10 Essential (primary) hypertension: Secondary | ICD-10-CM | POA: Insufficient documentation

## 2018-05-28 DIAGNOSIS — Z7984 Long term (current) use of oral hypoglycemic drugs: Secondary | ICD-10-CM | POA: Insufficient documentation

## 2018-05-28 DIAGNOSIS — F329 Major depressive disorder, single episode, unspecified: Secondary | ICD-10-CM | POA: Insufficient documentation

## 2018-05-28 DIAGNOSIS — K449 Diaphragmatic hernia without obstruction or gangrene: Secondary | ICD-10-CM | POA: Insufficient documentation

## 2018-05-28 DIAGNOSIS — Z79899 Other long term (current) drug therapy: Secondary | ICD-10-CM | POA: Diagnosis not present

## 2018-05-28 DIAGNOSIS — K640 First degree hemorrhoids: Secondary | ICD-10-CM | POA: Diagnosis not present

## 2018-05-28 DIAGNOSIS — Z87891 Personal history of nicotine dependence: Secondary | ICD-10-CM | POA: Diagnosis not present

## 2018-05-28 DIAGNOSIS — K219 Gastro-esophageal reflux disease without esophagitis: Secondary | ICD-10-CM | POA: Insufficient documentation

## 2018-05-28 HISTORY — PX: COLONOSCOPY WITH PROPOFOL: SHX5780

## 2018-05-28 HISTORY — DX: Diarrhea, unspecified: R19.7

## 2018-05-28 HISTORY — PX: ESOPHAGOGASTRODUODENOSCOPY (EGD) WITH PROPOFOL: SHX5813

## 2018-05-28 LAB — GLUCOSE, CAPILLARY: Glucose-Capillary: 187 mg/dL — ABNORMAL HIGH (ref 70–99)

## 2018-05-28 LAB — POCT PREGNANCY, URINE: Preg Test, Ur: NEGATIVE

## 2018-05-28 SURGERY — COLONOSCOPY WITH PROPOFOL
Anesthesia: General

## 2018-05-28 MED ORDER — PROPOFOL 10 MG/ML IV BOLUS
INTRAVENOUS | Status: AC
  Filled 2018-05-28: qty 20

## 2018-05-28 MED ORDER — LIDOCAINE HCL (CARDIAC) PF 100 MG/5ML IV SOSY
PREFILLED_SYRINGE | INTRAVENOUS | Status: DC | PRN
  Administered 2018-05-28: 100 mg via INTRAVENOUS

## 2018-05-28 MED ORDER — SODIUM CHLORIDE 0.9 % IV SOLN
INTRAVENOUS | Status: DC
  Administered 2018-05-28: 08:00:00 via INTRAVENOUS

## 2018-05-28 MED ORDER — SODIUM CHLORIDE 0.9 % IV SOLN: INTRAVENOUS | Status: DC

## 2018-05-28 MED ORDER — MIDAZOLAM HCL 2 MG/2ML IJ SOLN
INTRAMUSCULAR | Status: AC
  Filled 2018-05-28: qty 2

## 2018-05-28 MED ORDER — PROPOFOL 500 MG/50ML IV EMUL
INTRAVENOUS | Status: AC
  Filled 2018-05-28: qty 50

## 2018-05-28 MED ORDER — PHENYLEPHRINE HCL 10 MG/ML IJ SOLN
INTRAMUSCULAR | Status: DC | PRN
  Administered 2018-05-28: 100 ug via INTRAVENOUS

## 2018-05-28 MED ORDER — MIDAZOLAM HCL 2 MG/2ML IJ SOLN
INTRAMUSCULAR | Status: DC | PRN
  Administered 2018-05-28: 2 mg via INTRAVENOUS

## 2018-05-28 MED ORDER — PROPOFOL 500 MG/50ML IV EMUL
INTRAVENOUS | Status: DC | PRN
  Administered 2018-05-28: 125 ug/kg/min via INTRAVENOUS

## 2018-05-28 MED ORDER — PROPOFOL 10 MG/ML IV BOLUS
INTRAVENOUS | Status: DC | PRN
  Administered 2018-05-28 (×2): 20 mg via INTRAVENOUS
  Administered 2018-05-28: 50 mg via INTRAVENOUS
  Administered 2018-05-28 (×2): 10 mg via INTRAVENOUS

## 2018-05-28 NOTE — Op Note (Signed)
[facility] Gastroenterology Patient Name: Lindsey Strickland Procedure Date: 05/28/2018 7:14 AM MRN: [id number] Account #: [id number] Date of Birth: 03/17/1976 Admit Type: Outpatient Age: 42 Room: [facility] ENDO ROOM 3 Gender: Female Note Status: Finalized Procedure:            Colonoscopy Indications:          Chronic diarrhea, Clinically significant diarrhea of                        unexplained origin Providers:            Manya Silvas, MD Referring MD:         Einar Pheasant, MD (Referring MD) Medicines:            Propofol per Anesthesia Complications:        No immediate complications. Procedure:            Pre-Anesthesia Assessment:                       - After reviewing the risks and benefits, the patient                        was deemed in satisfactory condition to undergo the                        procedure.                       After obtaining informed consent, the colonoscope was                        passed under direct vision. Throughout the procedure,                        the patient's blood pressure, pulse, and oxygen                        saturations were monitored continuously. The                        Colonoscope was introduced through the anus and                        advanced to the the cecum, identified by appendiceal                        orifice and ileocecal valve. The colonoscopy was                        performed without difficulty. The patient tolerated the                        procedure well. The quality of the bowel preparation                        was good. Findings:      Internal hemorrhoids were found during endoscopy. The hemorrhoids were       small and Grade I (internal hemorrhoids that do not prolapse).      The exam was otherwise without abnormality. Due to diarrhea I biopsied       the colon in ascending,  transverse, descending, and sigmoid colon. to       look for microscopic colitis as a possible cause  of diarrhea. Impression:           - Internal hemorrhoids.                       - The examination was otherwise normal.                       - No specimens collected. Recommendation:       - Await pathology results. Manya Silvas, MD 05/28/2018 8:24:55 AM This report has been signed electronically. Number of Addenda: 0 Note Initiated On: 05/28/2018 7:14 AM Scope Withdrawal Time: 0 hours 6 minutes 32 seconds  Total Procedure Duration: 0 hours 11 minutes 53 seconds       [facility]

## 2018-05-28 NOTE — Op Note (Addendum)
[facility] Gastroenterology Patient Name: Lindsey Strickland Procedure Date: 05/28/2018 7:26 AM MRN: [id number] Account #: [id number] Date of Birth: [birth year] Admit Type: Outpatient Age: 42 Room: [facility] ENDO ROOM 3 Gender: Female Note Status: Finalized Procedure:            Upper GI endoscopy Indications:          Epigastric abdominal pain Providers:            Manya Silvas, MD Referring MD:         Einar Pheasant, MD (Referring MD) Medicines:            Propofol per Anesthesia Complications:        No immediate complications. Procedure:            Pre-Anesthesia Assessment:                       - After reviewing the risks and benefits, the patient                        was deemed in satisfactory condition to undergo the                        procedure.                       After obtaining informed consent, the endoscope was                        passed under direct vision. Throughout the procedure,                        the patient's blood pressure, pulse, and oxygen                        saturations were monitored continuously. The Endoscope                        was introduced through the mouth, and advanced to the                        second part of duodenum. The upper GI endoscopy was                        accomplished without difficulty. The patient tolerated                        the procedure well. Findings:      The stomach was normal.      The examined duodenum was normal.      LA Grade A (one or more mucosal breaks less than 5 mm, not extending       between tops of 2 mucosal folds) esophagitis with no bleeding was found       38 cm from the incisors.      A medium-sized hiatal hernia was present.      After the colonoscopy was done I passed the EGD scope again to get       biopsies of small bowel in 2ed portion. Done for family history of       celiac disease. The mucosa looked normal. Impression:           - Normal  esophagus.                  - Medium-sized hiatal hernia. Biopsied.                       - Normal stomach. Recommendation:       Take medicine                       - Perform a colonoscopy as previously scheduled. Manya Silvas, MD 05/28/2018 8:07:31 AM This report has been signed electronically. Number of Addenda: 0 Note Initiated On: 05/28/2018 7:26 AM      [facility]

## 2018-05-28 NOTE — Anesthesia Post-op Follow-up Note (Signed)
Anesthesia QCDR form completed.        

## 2018-05-28 NOTE — Transfer of Care (Signed)
Immediate Anesthesia Transfer of Care Note  Patient: Lindsey Strickland  Procedure(s) Performed: COLONOSCOPY WITH PROPOFOL (N/A ) ESOPHAGOGASTRODUODENOSCOPY (EGD) WITH PROPOFOL (N/A )  Patient Location: PACU  Anesthesia Type:General  Level of Consciousness: awake  Airway & Oxygen Therapy: Patient Spontanous Breathing and Patient connected to nasal cannula oxygen  Post-op Assessment: Report given to RN and Post -op Vital signs reviewed and stable  Post vital signs: Reviewed and stable  Last Vitals:  Vitals Value Taken Time  BP    Temp    Pulse 97 05/28/2018  8:40 AM  Resp 17 05/28/2018  8:40 AM  SpO2 97 % 05/28/2018  8:40 AM  Vitals shown include unvalidated device data.  Last Pain:  Vitals:   05/28/18 0725  TempSrc: Tympanic  PainSc: 0-No pain         Complications: No apparent anesthesia complications

## 2018-05-28 NOTE — H&P (Signed)
Primary Care Physician:  Einar Pheasant, MD Primary Gastroenterologist:  Dr. Vira Agar  Pre-Procedure History & Physical: HPI:  Lindsey Strickland is a 42 y.o. female is here for an endoscopy and colonoscopy.  Done for diarrhea and abdominal pain.  Last colonoscopy 2007.   Past Medical History:  Diagnosis Date  . Atypical endometrial hyperplasia   . Diabetes mellitus ([place])   . Diarrhea   . Environmental allergies   . Generalized anxiety disorder   . GERD (gastroesophageal reflux disease)   . Hypercholesterolemia   . Hypertension   . Insomnia   . Major depressive disorder     Past Surgical History:  Procedure Laterality Date  . COLONOSCOPY    . HYSTEROSCOPY W/D&C     and polyp removal    Prior to Admission medications   Medication Sig Start Date End Date Taking? Authorizing Provider  albuterol (PROVENTIL HFA;VENTOLIN HFA) 108 (90 Base) MCG/ACT inhaler Inhale 2 puffs into the lungs every 6 (six) hours as needed for wheezing. 09/07/17  Yes Einar Pheasant, MD  amLODipine (NORVASC) 10 MG tablet TAKE ONE-HALF TABLET BY MOUTH TWICE DAILY 08/12/17  Yes Einar Pheasant, MD  ferrous sulfate 325 (65 FE) MG tablet Take 325 mg by mouth daily with breakfast.   Yes [provider]  fluticasone (FLONASE) 50 MCG/ACT nasal spray Place 2 sprays into both nostrils daily. 09/14/16  Yes Einar Pheasant, MD  glucose blood test strip 1 each by Other route 2 (two) times daily. Use as instructed, use one strip twice a day   Yes [provider]  Lancets MISC by Does not apply route. Use 1 lancets three times a day   Yes [provider]  loratadine (ALAVERT) 10 MG tablet Take 10 mg by mouth daily.   Yes [provider]  losartan (COZAAR) 100 MG tablet Take 1 tablet (100 mg total) by mouth daily. Must keep next appt 12/16/17  Yes Einar Pheasant, MD  metFORMIN (GLUCOPHAGE-XR) 500 MG 24 hr tablet TAKE 2 TABLETS BY MOUTH TWICE DAILY 01/28/18  Yes Einar Pheasant, MD  metoprolol  succinate (TOPROL-XL) 100 MG 24 hr tablet TAKE 1/2 (ONE-HALF) TABLET BY MOUTH TWICE DAILY 05/12/18  Yes Einar Pheasant, MD  pravastatin (PRAVACHOL) 40 MG tablet TAKE 1 TABLET BY MOUTH ONCE DAILY -  MUST  KEEP  NEXT  APPT 03/24/18  Yes Einar Pheasant, MD  Probiotic Product (ALIGN PO) Take by mouth daily.   Yes [provider]  ranitidine (ZANTAC) 150 MG tablet TAKE 1 TABLET BY MOUTH ONCE DAILY 05/26/18  Yes Einar Pheasant, MD    Allergies as of 05/15/2018 - Review Complete 05/06/2018  Allergen Reaction Noted  . Tetracycline Other (See Comments) 12/13/2013  . Augmentin [amoxicillin-pot clavulanate]  07/15/2009  . Hctz [hydrochlorothiazide]  07/09/2005  . Lisinopril  05/04/2016  . Other  11/13/2004  . Prednisone  02/23/2016  . Ciprofloxacin Nausea Only and Palpitations 02/23/2016    Family History  Problem Relation Age of Onset  . Hypertension Mother   . Diabetes Mother   . Diverticulosis Mother   . Hypercholesterolemia Father   . Hypertension Father   . Cancer Father   . Heart disease Maternal Grandfather   . Diverticulosis Paternal Grandmother   . Hyperlipidemia Paternal Grandmother   . Heart failure Paternal Grandmother   . Cancer Maternal Grandmother     Social History   Socioeconomic History  . Marital status: Single    Spouse name: Not on file  . Number of children: 2  .  Years of education: Not on file  . Highest education level: Not on file  Occupational History    Employer: [employer]  . Financial resource strain: Not on file  . Food insecurity:    Worry: Not on file    Inability: Not on file  . Transportation needs:    Medical: Not on file    Non-medical: Not on file  Tobacco Use  . Smoking status: Former Smoker    Last attempt to quit: 08/06/2000    Years since quitting: 17.8  . Smokeless tobacco: Never Used  Substance and Sexual Activity  . Alcohol use: Yes    Alcohol/week: 0.0 standard drinks    Comment: rare  . Drug use:  No  . Sexual activity: Not on file  Lifestyle  . Physical activity:    Days per week: Not on file    Minutes per session: Not on file  . Stress: Not on file  Relationships  . Social connections:    Talks on phone: Not on file    Gets together: Not on file    Attends religious service: Not on file    Active member of club or organization: Not on file    Attends meetings of clubs or organizations: Not on file    Relationship status: Not on file  . Intimate partner violence:    Fear of current or ex partner: Not on file    Emotionally abused: Not on file    Physically abused: Not on file    Forced sexual activity: Not on file  Other Topics Concern  . Not on file  Social History Narrative  . Not on file    Review of Systems: See HPI, otherwise negative ROS  Physical Exam: BP (!) 167/94   Pulse (!) 106   Temp 98 F (36.7 C) (Tympanic)   Resp 18   Ht 5' 7.5" (1.715 m)   Wt 136.1 kg   SpO2 100%   BMI 46.29 kg/m  General:   Alert,  pleasant and cooperative in NAD Head:  Normocephalic and atraumatic. Neck:  Supple; no masses or thyromegaly. Lungs:  Clear throughout to auscultation.    Heart:  Regular rate and rhythm. Abdomen:  Soft, nontender and nondistended. Normal bowel sounds, without guarding, and without rebound.   Neurologic:  Alert and  oriented x4;  grossly normal neurologically.  Impression/Plan: Lindsey Strickland is here for an endoscopy and colonoscopy to be performed for abdominal pain and diarrhea.  Risks, benefits, limitations, and alternatives regarding  endoscopy and colonoscopy have been reviewed with the patient.  Questions have been answered.  All parties agreeable.   Gaylyn Cheers, MD  05/28/2018, 7:37 AM

## 2018-05-28 NOTE — Brief Op Note (Signed)
MD performed additional egd (after completed colonoscopy) to take additional biopsies.

## 2018-05-28 NOTE — Anesthesia Postprocedure Evaluation (Signed)
Anesthesia Post Note  Patient: Lindsey Strickland  Procedure(s) Performed: COLONOSCOPY WITH PROPOFOL (N/A ) ESOPHAGOGASTRODUODENOSCOPY (EGD) WITH PROPOFOL (N/A )  Patient location during evaluation: PACU Anesthesia Type: General Level of consciousness: awake and alert Pain management: pain level controlled Vital Signs Assessment: post-procedure vital signs reviewed and stable Respiratory status: spontaneous breathing, nonlabored ventilation, respiratory function stable and patient connected to nasal cannula oxygen Cardiovascular status: blood pressure returned to baseline and stable Postop Assessment: no apparent nausea or vomiting Anesthetic complications: no     Last Vitals:  Vitals:   05/28/18 0850 05/28/18 0900  BP: (!) 93/51 136/82  Pulse: 97 94  Resp: 17 18  Temp:    SpO2: 100% 100%    Last Pain:  Vitals:   05/28/18 0900  TempSrc:   PainSc: 0-No pain                 Durenda Hurt

## 2018-05-28 NOTE — Anesthesia Preprocedure Evaluation (Addendum)
Anesthesia Evaluation  Patient identified by MRN, date of birth, ID band Patient awake    Reviewed: Allergy & Precautions, H&P , NPO status , Patient's Chart, lab work & pertinent test results  History of Anesthesia Complications (+) AWARENESS UNDER ANESTHESIA and history of anesthetic complications  Airway Mallampati: III       Dental  (+) Teeth Intact   Pulmonary neg pulmonary ROS, former smoker,    breath sounds clear to auscultation       Cardiovascular hypertension, negative cardio ROS   Rhythm:regular Rate:Normal     Neuro/Psych PSYCHIATRIC DISORDERS Anxiety Depression negative neurological ROS  negative psych ROS   GI/Hepatic negative GI ROS, Neg liver ROS, GERD  ,  Endo/Other  diabetesMorbid obesity  Renal/GU negative Renal ROS  negative genitourinary   Musculoskeletal   Abdominal   Peds  Hematology negative hematology ROS (+) Blood dyscrasia, anemia ,   Anesthesia Other Findings Past Medical History: No date: Atypical endometrial hyperplasia No date: Diabetes mellitus (HCC) No date: Diarrhea No date: Environmental allergies No date: Generalized anxiety disorder No date: GERD (gastroesophageal reflux disease) No date: Hypercholesterolemia No date: Hypertension No date: Insomnia No date: Major depressive disorder  Past Surgical History: No date: COLONOSCOPY No date: HYSTEROSCOPY W/D&C     Comment:  and polyp removal     Reproductive/Obstetrics negative OB ROS                          Anesthesia Physical Anesthesia Plan  ASA: III  Anesthesia Plan: General   Post-op Pain Management:    Induction:   PONV Risk Score and Plan: Propofol infusion and TIVA  Airway Management Planned: Natural Airway and Nasal Cannula  Additional Equipment:   Intra-op Plan:   Post-operative Plan:   Informed Consent: I have reviewed the patients History and Physical, chart, labs  and discussed the procedure including the risks, benefits and alternatives for the proposed anesthesia with the patient or authorized representative who has indicated his/her understanding and acceptance.   Dental Advisory Given  Plan Discussed with: Anesthesiologist, CRNA and Surgeon  Anesthesia Plan Comments:        Anesthesia Quick Evaluation

## 2018-05-31 LAB — SURGICAL PATHOLOGY

## 2018-06-04 ENCOUNTER — Other Ambulatory Visit: Payer: BC Managed Care – PPO

## 2018-06-05 ENCOUNTER — Other Ambulatory Visit: Payer: BC Managed Care – PPO

## 2018-06-05 ENCOUNTER — Ambulatory Visit: Payer: BC Managed Care – PPO

## 2018-06-05 ENCOUNTER — Ambulatory Visit: Payer: BC Managed Care – PPO | Admitting: Hematology and Oncology

## 2018-06-06 ENCOUNTER — Ambulatory Visit: Payer: BC Managed Care – PPO | Admitting: Hematology and Oncology

## 2018-06-06 ENCOUNTER — Ambulatory Visit: Payer: BC Managed Care – PPO

## 2018-06-19 ENCOUNTER — Ambulatory Visit: Payer: BC Managed Care – PPO | Admitting: Internal Medicine

## 2018-06-19 ENCOUNTER — Encounter: Payer: Self-pay | Admitting: Internal Medicine

## 2018-06-19 DIAGNOSIS — F439 Reaction to severe stress, unspecified: Secondary | ICD-10-CM

## 2018-06-19 DIAGNOSIS — E119 Type 2 diabetes mellitus without complications: Secondary | ICD-10-CM | POA: Diagnosis not present

## 2018-06-19 DIAGNOSIS — E7800 Pure hypercholesterolemia, unspecified: Secondary | ICD-10-CM | POA: Diagnosis not present

## 2018-06-19 DIAGNOSIS — I10 Essential (primary) hypertension: Secondary | ICD-10-CM | POA: Diagnosis not present

## 2018-06-19 DIAGNOSIS — D75839 Thrombocytosis, unspecified: Secondary | ICD-10-CM

## 2018-06-19 DIAGNOSIS — K219 Gastro-esophageal reflux disease without esophagitis: Secondary | ICD-10-CM | POA: Diagnosis not present

## 2018-06-19 DIAGNOSIS — D473 Essential (hemorrhagic) thrombocythemia: Secondary | ICD-10-CM

## 2018-06-19 DIAGNOSIS — D509 Iron deficiency anemia, unspecified: Secondary | ICD-10-CM

## 2018-06-19 MED ORDER — INTEGRA 62.5-62.5-40-3 MG PO CAPS: ORAL_CAPSULE | ORAL | 2 refills | Status: DC

## 2018-06-19 NOTE — Progress Notes (Signed)
Patient ID: Lindsey Strickland, female   DOB: 05-07-1976, 42 y.o.   MRN: [id number]   Subjective:    Patient ID: Lindsey Strickland, female    DOB: 1976-04-14, 42 y.o.   MRN: [id number]  HPI  Patient here for a scheduled follow up.  She is accompanied by her husband.  History obtained from both of them.  She has seen GI recently for persistent issues with diarrhea.  Has had extensive w/up.  Stool studies returned negative. S/p colonoscopy 05-2018 internal hemorrhoids otherwise normal.  EGD - hiatal hernia otherwise ok.  She has also been evaluated by hematology for thrombocytosis.  Note reviewed.  On iron supplements.  Some intolerance.  Discussed changing to [place].  Plans to f/u with Dr Mike Gip after first of the year.  Still with loose stools.  Varies.  No chest pain.  No sob.  Increased stress. Discussed with her today.  Desires psych referral.     Past Medical History:  Diagnosis Date  . Atypical endometrial hyperplasia   . Diabetes mellitus ([place])   . Diarrhea   . Environmental allergies   . Generalized anxiety disorder   . GERD (gastroesophageal reflux disease)   . Hypercholesterolemia   . Hypertension   . Insomnia   . Major depressive disorder    Past Surgical History:  Procedure Laterality Date  . COLONOSCOPY    . COLONOSCOPY WITH PROPOFOL N/A 05/28/2018   Procedure: COLONOSCOPY WITH PROPOFOL;  Surgeon: Manya Silvas, MD;  Location: [facility] ENDOSCOPY;  Service: Endoscopy;  Laterality: N/A;  . ESOPHAGOGASTRODUODENOSCOPY (EGD) WITH PROPOFOL N/A 05/28/2018   Procedure: ESOPHAGOGASTRODUODENOSCOPY (EGD) WITH PROPOFOL;  Surgeon: Manya Silvas, MD;  Location: [facility] ENDOSCOPY;  Service: Endoscopy;  Laterality: N/A;  . HYSTEROSCOPY W/D&C     and polyp removal   Family History  Problem Relation Age of Onset  . Hypertension Mother   . Diabetes Mother   . Diverticulosis Mother   . Hypercholesterolemia Father   . Hypertension Father   . Cancer Father   . Heart disease Maternal  Grandfather   . Diverticulosis Paternal Grandmother   . Hyperlipidemia Paternal Grandmother   . Heart failure Paternal Grandmother   . Cancer Maternal Grandmother    Social History   Socioeconomic History  . Marital status: Single    Spouse name: Not on file  . Number of children: 2  . Years of education: Not on file  . Highest education level: Not on file  Occupational History    Employer: [employer]  . Financial resource strain: Not on file  . Food insecurity:    Worry: Not on file    Inability: Not on file  . Transportation needs:    Medical: Not on file    Non-medical: Not on file  Tobacco Use  . Smoking status: Former Smoker    Last attempt to quit: 08/06/2000    Years since quitting: 17.8  . Smokeless tobacco: Never Used  Substance and Sexual Activity  . Alcohol use: Yes    Alcohol/week: 0.0 standard drinks    Comment: rare  . Drug use: No  . Sexual activity: Not on file  Lifestyle  . Physical activity:    Days per week: Not on file    Minutes per session: Not on file  . Stress: Not on file  Relationships  . Social connections:    Talks on phone: Not on file    Gets together: Not on file  Attends religious service: Not on file    Active member of club or organization: Not on file    Attends meetings of clubs or organizations: Not on file    Relationship status: Not on file  Other Topics Concern  . Not on file  Social History Narrative  . Not on file    Outpatient Encounter Medications as of 06/19/2018  Medication Sig  . albuterol (PROVENTIL HFA;VENTOLIN HFA) 108 (90 Base) MCG/ACT inhaler Inhale 2 puffs into the lungs every 6 (six) hours as needed for wheezing.  Marland Kitchen amLODipine (NORVASC) 10 MG tablet TAKE ONE-HALF TABLET BY MOUTH TWICE DAILY  . Fe Fum-FePoly-Vit C-Vit B3 (INTEGRA) 62.5-62.5-40-3 MG CAPS One tablet daily  . ferrous sulfate 325 (65 FE) MG tablet Take 325 mg by mouth daily with breakfast.  . fluticasone (FLONASE) 50  MCG/ACT nasal spray Place 2 sprays into both nostrils daily.  Marland Kitchen glucose blood test strip 1 each by Other route 2 (two) times daily. Use as instructed, use one strip twice a day  . Lancets MISC by Does not apply route. Use 1 lancets three times a day  . loratadine (ALAVERT) 10 MG tablet Take 10 mg by mouth daily.  Marland Kitchen losartan (COZAAR) 100 MG tablet Take 1 tablet (100 mg total) by mouth daily. Must keep next appt  . metFORMIN (GLUCOPHAGE-XR) 500 MG 24 hr tablet TAKE 2 TABLETS BY MOUTH TWICE DAILY  . metoprolol succinate (TOPROL-XL) 100 MG 24 hr tablet TAKE 1/2 (ONE-HALF) TABLET BY MOUTH TWICE DAILY  . pravastatin (PRAVACHOL) 40 MG tablet TAKE 1 TABLET BY MOUTH ONCE DAILY -  MUST  KEEP  NEXT  APPT  . Probiotic Product (ALIGN PO) Take by mouth daily.  . ranitidine (ZANTAC) 150 MG tablet TAKE 1 TABLET BY MOUTH ONCE DAILY   No facility-administered encounter medications on file as of 06/19/2018.     Review of Systems  Constitutional: Negative for appetite change and unexpected weight change.  HENT: Negative for congestion and sinus pressure.   Respiratory: Negative for cough, chest tightness and shortness of breath.   Cardiovascular: Negative for chest pain, palpitations and leg swelling.  Gastrointestinal: Positive for diarrhea. Negative for abdominal pain, nausea and vomiting.  Genitourinary: Negative for difficulty urinating and dysuria.  Musculoskeletal: Negative for joint swelling and myalgias.  Skin: Negative for color change and rash.  Neurological: Negative for dizziness, light-headedness and headaches.  Psychiatric/Behavioral: Negative for agitation and dysphoric mood.       Objective:    Physical Exam  Constitutional: She appears well-developed and well-nourished. No distress.  HENT:  Nose: Nose normal.  Mouth/Throat: Oropharynx is clear and moist.  Neck: Neck supple. No thyromegaly present.  Cardiovascular: Normal rate and regular rhythm.  Pulmonary/Chest: Breath sounds  normal. No respiratory distress. She has no wheezes.  Abdominal: Soft. Bowel sounds are normal. There is no tenderness.  Musculoskeletal: She exhibits no edema or tenderness.  Lymphadenopathy:    She has no cervical adenopathy.  Skin: No rash noted. No erythema.  Psychiatric: She has a normal mood and affect. Her behavior is normal.    BP (!) 142/90 (BP Location: Left Arm, Patient Position: Sitting, Cuff Size: Large)   Pulse 100   Temp 98.2 F (36.8 C) (Oral)   Resp 18   Wt (!) 302 lb (137 kg)   SpO2 98%   BMI 46.60 kg/m  Wt Readings from Last 3 Encounters:  06/19/18 (!) 302 lb (137 kg)  05/28/18 300 lb (136.1 kg)  05/05/18 298  lb 7 oz (135.4 kg)     Lab Results  Component Value Date   WBC 12.0 (H) 04/15/2018   HGB 13.4 04/15/2018   HCT 41.1 04/15/2018   PLT 482 (H) 04/15/2018   GLUCOSE 158 (H) 05/05/2018   CHOL 197 04/15/2018   TRIG 317.0 (H) 04/15/2018   HDL 41.00 04/15/2018   LDLDIRECT 134.0 04/15/2018   LDLCALC 114 (H) 12/15/2015   ALT 20 05/05/2018   AST 23 05/05/2018   NA 136 05/05/2018   K 4.0 05/05/2018   CL 102 05/05/2018   CREATININE 0.60 05/05/2018   BUN 14 05/05/2018   CO2 21 (L) 05/05/2018   TSH 2.89 04/15/2018   INR 0.93 05/05/2018   HGBA1C 7.3 (A) 04/15/2018   MICROALBUR 1.9 10/22/2017       Assessment & Plan:   Problem List Items Addressed This Visit    Diabetes mellitus ([place])    Low carb diet and exercise.  Follow met b and a1c.  Trying to adjust her diet and lose weight.        GERD (gastroesophageal reflux disease)    Still taking zantac.  Discussed changing to pepcid.  Plans to discuss with GI.  Had recent EGD as outlined.        Hypercholesterolemia    On pravastatin.  Low cholesterol diet and exercise.  Follow lipid panel and liver function tests.        Hypertension    Blood pressure elevated.  Discussed additional medication.  She declines at this time.  Wants to work on diet and exercise.  Follow pressures.  Follow metabolic  panel.        Iron deficiency anemia    Seeing Dr Mike Gip.  Note reviewed. Planning for f/u after first of the year.  Change iron to integra.        Relevant Medications   Fe Fum-FePoly-Vit C-Vit B3 (INTEGRA) 62.5-62.5-40-3 MG CAPS   Severe obesity (BMI >= 40) (HCC)    Discussed diet, exercise and weight loss.  Follow.        Stress    Increased stress as outlined.  She is agreeable to psych referral.        Relevant Orders   Ambulatory referral to Psychiatry   Thrombocytosis ([place])    Evaluated by hematology.  Question if reactive.  Integra.  Keep f/u with hematology.            Einar Pheasant, MD

## 2018-06-22 ENCOUNTER — Encounter: Payer: Self-pay | Admitting: Internal Medicine

## 2018-06-22 NOTE — Assessment & Plan Note (Signed)
Discussed diet, exercise and weight loss.  Follow.    

## 2018-06-22 NOTE — Assessment & Plan Note (Signed)
Evaluated by hematology.  Question if reactive.  Integra.  Keep f/u with hematology.

## 2018-06-22 NOTE — Assessment & Plan Note (Signed)
Seeing Dr Mike Gip.  Note reviewed. Planning for f/u after first of the year.  Change iron to integra.

## 2018-06-22 NOTE — Assessment & Plan Note (Signed)
Increased stress as outlined.  She is agreeable to psych referral.

## 2018-06-22 NOTE — Assessment & Plan Note (Signed)
On pravastatin.  Low cholesterol diet and exercise.  Follow lipid panel and liver function tests.   

## 2018-06-22 NOTE — Assessment & Plan Note (Signed)
Still taking zantac.  Discussed changing to pepcid.  Plans to discuss with GI.  Had recent EGD as outlined.

## 2018-06-22 NOTE — Assessment & Plan Note (Signed)
Low carb diet and exercise.  Follow met b and a1c.  Trying to adjust her diet and lose weight.

## 2018-06-22 NOTE — Assessment & Plan Note (Signed)
Blood pressure elevated.  Discussed additional medication.  She declines at this time.  Wants to work on diet and exercise.  Follow pressures.  Follow metabolic panel.

## 2018-06-23 ENCOUNTER — Other Ambulatory Visit: Payer: Self-pay | Admitting: Internal Medicine

## 2018-06-23 MED ORDER — POLYSACCHARIDE IRON COMPLEX 150 MG PO CAPS: 150.0000 mg | ORAL_CAPSULE | Freq: Every day | ORAL | 2 refills | Status: DC

## 2018-06-23 MED ORDER — FLUTICASONE PROPIONATE 50 MCG/ACT NA SUSP: 2.0000 | Freq: Every day | NASAL | 3 refills | Status: DC

## 2018-06-23 NOTE — Telephone Encounter (Signed)
rx sent in for nu iron.

## 2018-06-23 NOTE — Telephone Encounter (Signed)
rx sent in for flonase.  My chart message sent to pt to see if wants to try nu iron.

## 2018-06-30 ENCOUNTER — Other Ambulatory Visit: Payer: Self-pay | Admitting: Internal Medicine

## 2018-07-07 ENCOUNTER — Other Ambulatory Visit: Payer: Self-pay

## 2018-07-07 ENCOUNTER — Encounter: Payer: Self-pay | Admitting: Internal Medicine

## 2018-07-07 MED ORDER — LOSARTAN POTASSIUM 100 MG PO TABS: ORAL_TABLET | ORAL | 0 refills | Status: DC

## 2018-07-21 ENCOUNTER — Ambulatory Visit: Admit: 2018-07-21 | Payer: BC Managed Care – PPO | Admitting: Unknown Physician Specialty

## 2018-07-21 SURGERY — EGD (ESOPHAGOGASTRODUODENOSCOPY)
Anesthesia: General

## 2018-07-28 ENCOUNTER — Other Ambulatory Visit: Payer: Self-pay | Admitting: Internal Medicine

## 2018-08-04 ENCOUNTER — Ambulatory Visit: Payer: BC Managed Care – PPO | Admitting: Pharmacist

## 2018-08-04 ENCOUNTER — Ambulatory Visit (INDEPENDENT_AMBULATORY_CARE_PROVIDER_SITE_OTHER): Payer: BC Managed Care – PPO | Admitting: Pharmacist

## 2018-08-04 ENCOUNTER — Encounter: Payer: Self-pay | Admitting: Pharmacist

## 2018-08-04 VITALS — BP 127/81 | HR 90 | Wt 304.6 lb

## 2018-08-04 DIAGNOSIS — E119 Type 2 diabetes mellitus without complications: Secondary | ICD-10-CM | POA: Diagnosis not present

## 2018-08-04 DIAGNOSIS — E7800 Pure hypercholesterolemia, unspecified: Secondary | ICD-10-CM | POA: Diagnosis not present

## 2018-08-04 LAB — POCT GLYCOSYLATED HEMOGLOBIN (HGB A1C): Hemoglobin A1C: 7 % — AB (ref 4.0–5.6)

## 2018-08-04 NOTE — Assessment & Plan Note (Signed)
#  Diabetes - Currently uncontrolled, most recent A1c 7.0% today, improved from 7.3% on 04/15/2018; Goal A1c <6.5% given young age, relatively recent diabetes diagnosis. Either SGLT2 or GLP1 agents would be appropriate options at this point; patient may be more likely to benefit from GLP1 agents, though would want to titrate conservatively to reduce risk of exacerbating GI upset.  - Patient would like time to think about next steps prior to initiating pharmacotherapy. She was provided with information on both options. - Continue metformin XR 1000 mg BID - Provided with handout on healthy eating, appropriate portion sizes to her and her husband.

## 2018-08-04 NOTE — Assessment & Plan Note (Signed)
#  ASCVD risk - primary prevention in patient aged 40-75 with DM, baseline LDL 70-189; ASCVD risk <20%. However, LDL was 134 on last check; could consider targeting a goal LDL <100 in patients with diabetes. Would be appropriate to change to higher intensity statin - Patient declined to change pravastatin to rosuvastatin at this time. Consider moving forward.

## 2018-08-04 NOTE — Progress Notes (Addendum)
S:     Chief Complaint  Patient presents with  . Medication Management    Diabetes    Patient arrives in good spirits with her husband.  Presents for diabetes evaluation, education, and management at the request of Dr. Nicki Reaper after 04/15/2018 appointment. She was last seen by PCP on 06/19/2018. In the interim, she has had sinus infections and continues to struggle with diarrhea/loose stools. She believes that her diarrhea has improved since going off ferrous sulfate supplementation, but has not started the Nu-Iron prescribed by Dr. Nicki Reaper.   Patient reports Diabetes was diagnosed ~2012.    Family/Social History: Mother has diabetes;   Insurance coverage/medication affordability: Pharmacologist  Patient reports adherence with medications.  Current diabetes medications include: metformin XR 2000 mg daily  Current hypertension medications include: losartan 100 mg daily, metoprolol succinate 50 mg BID, amlodipine 5 mg BID Current hyperlipidemia medications include: pravastatin 40 mg daily  Patient denies hypoglycemic s/sx including dizziness, shakiness, sweating. Patient reports neuropathy + dry skin in her toes. Patient reports self foot exams.   Patient reported dietary habits: Eats 3 meals/day Breakfast: Oatmeal or eggos or little biscuits + bacon or sausage or ham Mid morning snack: nuts, baked chips, goldfish; [employer] nut bars; kind bars Lunch: Leftovers from the night before;  Afternoon snacks: sugar free jello; fat free/sugar free pudding;  Dinner: Spaghetti; beef tips and rice; soup, sandwiches; meat + broccoli; eats a lot of chicken; salmon Drinks: Water; 1 serving of diet/caffeine free soda   Patient reported exercise habits: very little extra physical activity; used to walk outside after supper but has stopped with the weather changing    O:  Physical Exam Vitals signs reviewed.  Constitutional:      Appearance: Normal appearance.     Review of Systems  All  other systems reviewed and are negative.    Lab Results  Component Value Date   HGBA1C 7.3 (A) 04/15/2018   Vitals:   08/04/18 1429  BP: 127/81  Pulse: 90    Basic Metabolic Panel BMP Latest Ref Rng & Units 05/05/2018 04/15/2018 10/22/2017  Glucose 70 - 99 mg/dL 158(H) 181(H) 176(H)  BUN 6 - 20 mg/dL 14 16 13   Creatinine 0.44 - 1.00 mg/dL 0.60 0.59 0.42  Sodium 135 - 145 mmol/L 136 136 135  Potassium 3.5 - 5.1 mmol/L 4.0 4.2 4.2  Chloride 98 - 111 mmol/L 102 98 100  CO2 22 - 32 mmol/L 21(L) 25 25  Calcium 8.9 - 10.3 mg/dL 9.4 9.7 9.3     Lipid Panel     Component Value Date/Time   CHOL 197 04/15/2018 0954   TRIG 317.0 (H) 04/15/2018 0954   HDL 41.00 04/15/2018 0954   CHOLHDL 5 04/15/2018 0954   VLDL 63.4 (H) 04/15/2018 0954   LDLCALC 114 (H) 12/15/2015 0826   LDLDIRECT 134.0 04/15/2018 0954    Fasting SMBG: Patient is not checking blood sugars   Clinical ASCVD: No  The 10-year ASCVD risk score Mikey Bussing DC Jr., et al., 2013) is: 2.5%   Values used to calculate the score:     Age: 42 years     Sex: Female     Is Non-Hispanic African American: No     Diabetic: Yes     Tobacco smoker: No     Systolic Blood Pressure: 469 mmHg     Is BP treated: Yes     HDL Cholesterol: 41 mg/dL     Total Cholesterol: 197 mg/dL  A/P: Following discussion and approval by Dr. Nicki Reaper, the following medication changes were made:   #Diabetes - Currently uncontrolled, most recent A1c 7.0% today, improved from 7.3% on 04/15/2018; Goal A1c <6.5% given young age, relatively recent diabetes diagnosis. Either SGLT2 or GLP1 agents would be appropriate options at this point; patient may be more likely to benefit from GLP1 agents, though would want to titrate conservatively to reduce risk of exacerbating GI upset.  - Patient would like time to think about next steps prior to initiating pharmacotherapy. She was provided with information on both options. - Continue metformin XR 1000 mg BID -  Provided with handout on healthy eating, appropriate portion sizes to her and her husband.  #Hypertension currently controlled. Goal BP <130/80. Patient reports adherence with medication. - Continue current regimen: losartan 100 mg daily, metoprolol succinate 50 mg BID, and amlodipine 5 mg BID  #ASCVD risk - primary prevention in patient aged 52-75 with DM, baseline LDL 70-189; ASCVD risk <20%. However, LDL was 134 on last check; could consider targeting a goal LDL <100 in patients with diabetes. Would be appropriate to change to higher intensity statin - Patient declined to change pravastatin to rosuvastatin at this time. Consider moving forward.    Written patient instructions provided.  Total time in face to face counseling 60 minutes.    Follow up in Pharmacist Clinic Visit 6 weeks.   De Hollingshead, PharmD PGY2 Ambulatory Care Pharmacy Resident Phone: [phone]   Reviewed above information.  Agree with assessment and plan.    Dr Nicki Reaper

## 2018-08-04 NOTE — Patient Instructions (Addendum)
It was great to meet you both today!   We talked about two options for diabetes: injectable non-insulin agents (glucagon-like 1 peptide agonists, GLP1s) or medications that work in the kidneys (sodium glucose co transporter 2 inhibitors, SGLT2s). Both agents have been shown to reduce the risk of heart disease in patients with diabetes. The non-insulin injectable agents often lead to more weight loss, but may have some stomach upset when first starting. The kidney pill helps protect your kidney function, but works by causing you to pee out more sugar. I think either would be fantastic option for you that would complement the good food choices you are making. With either option, "coupon cards" are available on the manufacturer website to help reduce the cost.  Schedule follow up with me in January. At that point, we can talk more. Our goal A1c would be less than 6.5%. It would be great if you could check your blood sugar a few times a week, just to keep an eye on things. The goal for fasting blood sugars is less than 130, the goal for blood sugars 2 hours after meals is less than 180.  Feel free to reach out to clinic with any questions or concerns.    Catie Darnelle Maffucci, PharmD

## 2018-08-05 ENCOUNTER — Encounter: Payer: Self-pay | Admitting: Psychiatry

## 2018-08-05 ENCOUNTER — Ambulatory Visit: Payer: BC Managed Care – PPO | Admitting: Psychiatry

## 2018-08-05 VITALS — BP 155/81 | HR 97 | Ht 67.5 in | Wt 301.0 lb

## 2018-08-05 DIAGNOSIS — F330 Major depressive disorder, recurrent, mild: Secondary | ICD-10-CM | POA: Diagnosis not present

## 2018-08-05 DIAGNOSIS — F411 Generalized anxiety disorder: Secondary | ICD-10-CM

## 2018-08-05 MED ORDER — HYDROXYZINE HCL 25 MG PO TABS: 12.5000 mg | ORAL_TABLET | Freq: Two times a day (BID) | ORAL | 0 refills | Status: DC | PRN

## 2018-08-05 NOTE — Progress Notes (Signed)
Psychiatric Initial Adult Assessment   Patient Identification: Lindsey Strickland MRN:  [id number] Date of Evaluation:  08/05/2018 Referral Source: Einar Pheasant MD Chief Complaint:  ' I am here to establish care." Chief Complaint    Depression; Anxiety; Establish Care     Visit Diagnosis: R/O Borderline personality disorder   ICD-10-CM   1. GAD (generalized anxiety disorder) F41.1 hydrOXYzine (ATARAX/VISTARIL) 25 MG tablet  2. MDD (major depressive disorder), recurrent episode, mild (HCC) F33.0     History of Present Illness: Lindsey Strickland is a 42 year old Caucasian female, has a history of depression, anxiety ,diabetes melitis, hypertension, hyperlipidemia, history of endometrial hyperplasia being treated with IUD, PMS, presented to the clinic today to establish care.   Patient today reports she has been struggling with depression since the past several years.  She describes her depressive symptoms as crying spells, suicidal thoughts, feeling down, sad, depressed, feeling tired, feeling bad about herself, inability to concentrate and so on.  She reports she has had chronic suicidal thoughts since her teenage years.  She reports the last time she felt that way was yesterday when she felt overwhelmed.  She reports her cable was not working and she has all these work that needs to be done by next week.  She reports she suddenly felt extremely depressed and suicidal.  She reports when she felt that way she cried and her thoughts passed away.  She denies any active plan.  She currently denies any suicidality.  She also reports self-injurious behaviors in the past.  She reports she has a history of cutting herself to feel the pain and she has done it in her teenage years.  She currently denies doing it.  She denies any suicide attempts.  Patient reports anxiety symptoms.  She reports she has been struggling with anxiety all her life.  She reports her anxiety symptoms as feeling anxious, nervous on edge,  worrying too much about different things, trouble relaxing, being restless, being irritable, feeling afraid something awful might happen and so on.  She reports her anxiety symptoms is getting worse since the past 1 year or so.  Patient denies any sleep problems.  Patient denies any perceptual disturbances.  Patient does report a lot of relationship struggles at work and also within her family.  Patient however has good social support system from her partner of 17 years.  She is also employed.  She reports relationship struggles with a coworker which affects her on a regular basis.  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  She reports she will complete in the spring and looks forward to the same.  She reports being a Ship broker and also having to manage work at the same time is also a constant stressor for her.  Patient is currently in psychotherapy at [facility] since March 2019.  She she sees a therapist Fabio Pierce Horton on a weekly basis.  She reports her therapy sessions have helped her a lot.  She also briefly saw Dr. Jenkins Rouge recently.  She reports she tried Celexa and Zoloft for a few days which gave her serious side effects.  Patient reports she was dismissed from the clinic.        Associated Signs/Symptoms: Depression Symptoms:  depressed mood, fatigue, difficulty concentrating, recurrent thoughts of death, anxiety, (Hypo) Manic Symptoms:  Irritable Mood, Labiality of Mood, Anxiety Symptoms:  Excessive Worry, Psychotic Symptoms:  denies PTSD Symptoms: Negative  Past Psychiatric History: Patient is currently in in  therapy at [facility] with therapist Fabio Pierce Horton.  She also briefly saw Dr. Nicolasa Ducking in the past.  Patient with history of depression and anxiety.  Patient denies any suicide attempts.  She however has a history of cutting in her teenage years.  She however reports chronic suicidal thoughts which is in the  back of her mind all the time even though she has never made active plans and never acted on it.  Previous Psychotropic Medications: Yes celexa - made her feel weird, zoloft - shaky  Substance Abuse History in the last 12 months:  No.  Consequences of Substance Abuse: Negative  Past Medical History: Past Medical History:  Diagnosis Date  . Atypical endometrial hyperplasia   . Diabetes mellitus ([place])   . Diarrhea   . Environmental allergies   . Generalized anxiety disorder   . GERD (gastroesophageal reflux disease)   . Hypercholesterolemia   . Hypertension   . Insomnia   . Major depressive disorder     Past Surgical History:  Procedure Laterality Date  . COLONOSCOPY    . COLONOSCOPY WITH PROPOFOL N/A 05/28/2018   Procedure: COLONOSCOPY WITH PROPOFOL;  Surgeon: Manya Silvas, MD;  Location[facility] ENDOSCOPY;  Service: Endoscopy;  Laterality: N/A;  . ESOPHAGOGASTRODUODENOSCOPY (EGD) WITH PROPOFOL N/A 05/28/2018   Procedure: ESOPHAGOGASTRODUODENOSCOPY (EGD) WITH PROPOFOL;  Surgeon: Manya Silvas, MD;  Location: [facility] ENDOSCOPY;  Service: Endoscopy;  Laterality: N/A;  . HYSTEROSCOPY W/D&C     and polyp removal    Family Psychiatric History: Mother - depression, Paternal side of family - anxiety, Paternal uncle - alcoholism  Family History:  Family History  Problem Relation Age of Onset  . Hypertension Mother   . Diabetes Mother   . Diverticulosis Mother   . Depression Mother   . Hypercholesterolemia Father   . Hypertension Father   . Cancer Father   . Heart disease Maternal Grandfather   . Diverticulosis Paternal Grandmother   . Hyperlipidemia Paternal Grandmother   . Heart failure Paternal Grandmother   . Cancer Maternal Grandmother   . Alcoholism Paternal Uncle     Social History:   Social History   Socioeconomic History  . Marital status: Single    Spouse name: Not on file  . Number of children: 2  . Years of education: Not on file  . Highest education  level: High school graduate  Occupational History    Employer: [employer]  Social Needs  . Financial resource strain: Not hard at all  . Food insecurity:    Worry: Never true    Inability: Never true  . Transportation needs:    Medical: No    Non-medical: No  Tobacco Use  . Smoking status: Former Smoker    Last attempt to quit: 08/06/2000    Years since quitting: 18.0  . Smokeless tobacco: Never Used  Substance and Sexual Activity  . Alcohol use: Yes    Alcohol/week: 0.0 standard drinks    Comment: rare  . Drug use: No  . Sexual activity: Yes    Birth control/protection: I.U.D.  Lifestyle  . Physical activity:    Days per week: Not on file    Minutes per session: Not on file  . Stress: Not on file  Relationships  . Social connections:    Talks on phone: Not on file    Gets together: Not on file    Attends religious service: 1 to 4 times per year    Active member  of club or organization: No    Attends meetings of clubs or organizations: Never    Relationship status: Living with partner  Other Topics Concern  . Not on file  Social History Narrative  . Not on file    Additional Social History: Lives in [place] with partner of 47 yr. Works as an Art therapist at [employer]. Currently getting associates at [employer] in [place] , will complete in the spring. Has two children - 11 and 37 y.  Allergies:   Allergies  Allergen Reactions  . Tetracycline Other (See Comments)    Other reaction(s): Unknown   . Augmentin [Amoxicillin-Pot Clavulanate]     GI intolerance   . Hctz [Hydrochlorothiazide]   . Lisinopril     Other reaction(s): Other (See Comments) cough  . Other     Mycins  . Prednisone     Chest pain  . Ciprofloxacin Nausea Only and Palpitations    Metabolic Disorder Labs: Lab Results  Component Value Date   HGBA1C 7.0 (A) 08/04/2018   No results found for: PROLACTIN Lab Results  Component Value Date   CHOL 197 04/15/2018   TRIG 317.0 (H) 04/15/2018    HDL 41.00 04/15/2018   CHOLHDL 5 04/15/2018   VLDL 63.4 (H) 04/15/2018   LDLCALC 114 (H) 12/15/2015   LDLCALC 89 08/18/2014   Lab Results  Component Value Date   TSH 2.89 04/15/2018    Therapeutic Level Labs: No results found for: LITHIUM No results found for: CBMZ No results found for: VALPROATE  Current Medications: Current Outpatient Medications  Medication Sig Dispense Refill  . albuterol (PROVENTIL HFA;VENTOLIN HFA) 108 (90 Base) MCG/ACT inhaler Inhale 2 puffs into the lungs every 6 (six) hours as needed for wheezing. 1 Inhaler 1  . amLODipine (NORVASC) 10 MG tablet TAKE 1/2 (ONE-HALF) TABLET BY MOUTH TWICE DAILY 90 tablet 0  . ferrous sulfate 325 (65 FE) MG tablet Take 325 mg by mouth daily with breakfast.    . fluticasone (FLONASE) 50 MCG/ACT nasal spray Place 2 sprays into both nostrils daily. 16 g 3  . glucose blood test strip 1 each by Other route 2 (two) times daily. Use as instructed, use one strip twice a day    . iron polysaccharides (NU-IRON) 150 MG capsule Take 1 capsule (150 mg total) by mouth daily. 30 capsule 2  . Lancets MISC by Does not apply route. Use 1 lancets three times a day    . loratadine (ALAVERT) 10 MG tablet Take 10 mg by mouth daily.    Marland Kitchen losartan (COZAAR) 100 MG tablet TAKE 1 TABLET BY MOUTH ONCE DAILY. MUST KEEP NEXT APPOINTMENT 90 tablet 0  . metFORMIN (GLUCOPHAGE-XR) 500 MG 24 hr tablet TAKE 2 TABLETS BY MOUTH TWICE DAILY 360 tablet 0  . metoprolol succinate (TOPROL-XL) 100 MG 24 hr tablet TAKE 1/2 (ONE-HALF) TABLET BY MOUTH TWICE DAILY 90 tablet 3  . omeprazole (PRILOSEC OTC) 20 MG tablet Take 20 mg by mouth daily.    . pravastatin (PRAVACHOL) 40 MG tablet TAKE 1 TABLET BY MOUTH ONCE DAILY. MUST KEEP NEXT APPOINTMENT 90 tablet 0  . ranitidine (ZANTAC) 150 MG tablet TAKE 1 TABLET BY MOUTH ONCE DAILY 90 tablet 0  . hydrOXYzine (ATARAX/VISTARIL) 25 MG tablet Take 0.5-1 tablets (12.5-25 mg total) by mouth 2 (two) times daily as needed (severe anxiety  sx). 30 tablet 0   No current facility-administered medications for this visit.     Musculoskeletal: Strength & Muscle Tone: within normal limits Gait &  Station: normal Patient leans: N/A  Psychiatric Specialty Exam: Review of Systems  Psychiatric/Behavioral: Positive for depression. The patient is nervous/anxious.   All other systems reviewed and are negative.   Blood pressure (!) 155/81, pulse 97, height 5' 7.5" (1.715 m), weight (!) 301 lb (136.5 kg), SpO2 96 %.Body mass index is 46.45 kg/m.  General Appearance: Casual  Eye Contact:  Fair  Speech:  Normal Rate  Volume:  Normal  Mood:  Anxious and Dysphoric  Affect:  Congruent  Thought Process:  Goal Directed and Descriptions of Associations: Intact  Orientation:  Full (Time, Place, and Person)  Thought Content:  Logical  Suicidal Thoughts:  No but does have chronic SI   Homicidal Thoughts:  No  Memory:  Immediate;   Fair Recent;   Fair Remote;   Fair  Judgement:  Fair  Insight:  Fair  Psychomotor Activity:  Normal  Concentration:  Concentration: Fair and Attention Span: Fair  Recall:  [employer] of Knowledge:Fair  Language: Fair  Akathisia:  No  Handed:  Right  AIMS (if indicated): denies tremors, rigidity,stiffness  Assets:  Communication Skills Desire for Improvement Social Support  ADL's:  Intact  Cognition: WNL  Sleep:  Fair   Screenings: PHQ2-9     Office Visit from 08/24/2013 in [facility]  PHQ-2 Total Score  0      Assessment and Plan: Lindsey Strickland is a 42 yr old Caucasian female, employed, Ship broker at [employer], lives with her partner in [place], has a history of depression, anxiety, hypertension, hyperlipidemia, endometrial hyperplasia currently on IUD, presented to the clinic today to establish care.  Patient is biologically predisposed given her history of mental health problems in her family.  She also has psychosocial stressors of work related problems, relationship conflicts within her  family as well as being a Ship broker.  Patient is sensitive to medications and has had side effects to at least 2 antidepressants in the past.  Discussed GeneSight testing with patient and she agrees with plan.  She will continue psychotherapy sessions in the meantime.  Plan  For MDD PHQ 9 equals 12 Patient wants to get GeneSight testing done prior to starting medications. She will continue therapy sessions with [facility].  For GAD GAD 7 equals 12 Hydroxyzine 12.5 to 25 mg p.o. twice daily PRN for severe anxiety symptoms.  She is on Claritin and hence discussed with her the risk of combining to antihistamine medications together.  She will limit the use.  R/O Personality disorder- borderline Pt history of mood lability, self-injurious behaviors, recurrent suicidality.  Patient completed a mood disorder questionnaire however did not meet criteria for bipolar disorder.  Will monitor her closely.  She may benefit from DBT.  TSH-reviewed in [facility]-within normal limits on 04/15/2018.  Patient to sign consent to obtain medical records from Dr. Nicolasa Ducking as well as [facility].  Crisis plan discussed with patient.  Follow-up in clinic in 2 weeks or sooner if needed.  More than 50 % of the time was spent for psychoeducation and supportive psychotherapy and care coordination.  This note was generated in part or whole with voice recognition software. Voice recognition is usually quite accurate but there are transcription errors that can and very often do occur. I apologize for any typographical errors that were not detected and corrected.      Ursula Alert, MD 12/31/20192:51 PM

## 2018-08-05 NOTE — Patient Instructions (Signed)
Hydroxyzine capsules or tablets What is this medicine? HYDROXYZINE (hye DROX i zeen) is an antihistamine. This medicine is used to treat allergy symptoms. It is also used to treat anxiety and tension. This medicine can be used with other medicines to induce sleep before surgery. This medicine may be used for other purposes; ask your health care provider or pharmacist if you have questions. COMMON BRAND NAME(S): ANX, Atarax, Rezine, Vistaril What should I tell my health care provider before I take this medicine? They need to know if you have any of these conditions: -glaucoma -heart disease -history of irregular heartbeat -kidney disease -liver disease -lung or breathing disease, like asthma -stomach or intestine problems -thyroid disease -trouble passing urine -an unusual or allergic reaction to hydroxyzine, cetirizine, other medicines, foods, dyes or preservatives -pregnant or trying to get pregnant -breast-feeding How should I use this medicine? Take this medicine by mouth with a full glass of water. Follow the directions on the prescription label. You may take this medicine with food or on an empty stomach. Take your medicine at regular intervals. Do not take your medicine more often than directed. Talk to your pediatrician regarding the use of this medicine in children. Special care may be needed. While this drug may be prescribed for children as young as 6 years of age for selected conditions, precautions do apply. Patients over 65 years old may have a stronger reaction and need a smaller dose. Overdosage: If you think you have taken too much of this medicine contact a poison control center or emergency room at once. NOTE: This medicine is only for you. Do not share this medicine with others. What if I miss a dose? If you miss a dose, take it as soon as you can. If it is almost time for your next dose, take only that dose. Do not take double or extra doses. What may interact with this  medicine? Do not take this medicine with any of the following medications: -cisapride -dofetilide -dronedarone -pimozide -thioridazine This medicine may also interact with the following medications: -alcohol -antihistamines for allergy, cough, and cold -atropine -barbiturate medicines for sleep or seizures, like phenobarbital -certain antibiotics like erythromycin or clarithromycin -certain medicines for anxiety or sleep -certain medicines for bladder problems like oxybutynin, tolterodine -certain medicines for depression or psychotic disturbances -certain medicines for irregular heart beat -certain medicines for Parkinson's disease like benztropine, trihexyphenidyl -certain medicines for seizures like phenobarbital, primidone -certain medicines for stomach problems like dicyclomine, hyoscyamine -certain medicines for travel sickness like scopolamine -ipratropium -narcotic medicines for pain -other medicines that prolong the QT interval (an abnormal heart rhythm) This list may not describe all possible interactions. Give your health care provider a list of all the medicines, herbs, non-prescription drugs, or dietary supplements you use. Also tell them if you smoke, drink alcohol, or use illegal drugs. Some items may interact with your medicine. What should I watch for while using this medicine? Tell your doctor or health care professional if your symptoms do not improve. You may get drowsy or dizzy. Do not drive, use machinery, or do anything that needs mental alertness until you know how this medicine affects you. Do not stand or sit up quickly, especially if you are an older patient. This reduces the risk of dizzy or fainting spells. Alcohol may interfere with the effect of this medicine. Avoid alcoholic drinks. Your mouth may get dry. Chewing sugarless gum or sucking hard candy, and drinking plenty of water may help. Contact your doctor if   the problem does not go away or is  severe. This medicine may cause dry eyes and blurred vision. If you wear contact lenses you may feel some discomfort. Lubricating drops may help. See your eye doctor if the problem does not go away or is severe. If you are receiving skin tests for allergies, tell your doctor you are using this medicine. What side effects may I notice from receiving this medicine? Side effects that you should report to your doctor or health care professional as soon as possible: -allergic reactions like skin rash, itching or hives, swelling of the face, lips, or tongue -changes in vision -confusion -fast, irregular heartbeat -seizures -tremor -trouble passing urine or change in the amount of urine Side effects that usually do not require medical attention (report to your doctor or health care professional if they continue or are bothersome): -constipation -drowsiness -dry mouth -headache -tiredness This list may not describe all possible side effects. Call your doctor for medical advice about side effects. You may report side effects to FDA at 1-800-FDA-1088. Where should I keep my medicine? Keep out of the reach of children. Store at room temperature between 15 and 30 degrees C (59 and 86 degrees F). Keep container tightly closed. Throw away any unused medicine after the expiration date. NOTE: This sheet is a summary. It may not cover all possible information. If you have questions about this medicine, talk to your doctor, pharmacist, or health care provider.  2019 Elsevier/Gold Standard (2018-02-03 13:25:13)  

## 2018-08-06 ENCOUNTER — Encounter: Payer: Self-pay | Admitting: Internal Medicine

## 2018-08-07 ENCOUNTER — Other Ambulatory Visit: Payer: Self-pay | Admitting: Internal Medicine

## 2018-08-07 DIAGNOSIS — D473 Essential (hemorrhagic) thrombocythemia: Secondary | ICD-10-CM

## 2018-08-07 DIAGNOSIS — D75839 Thrombocytosis, unspecified: Secondary | ICD-10-CM

## 2018-08-07 DIAGNOSIS — E119 Type 2 diabetes mellitus without complications: Secondary | ICD-10-CM

## 2018-08-07 DIAGNOSIS — E7800 Pure hypercholesterolemia, unspecified: Secondary | ICD-10-CM

## 2018-08-07 DIAGNOSIS — I10 Essential (primary) hypertension: Secondary | ICD-10-CM

## 2018-08-07 DIAGNOSIS — D509 Iron deficiency anemia, unspecified: Secondary | ICD-10-CM

## 2018-08-07 NOTE — Telephone Encounter (Signed)
OK to add these labs to labs in February?

## 2018-08-07 NOTE — Progress Notes (Signed)
Order placed for labs.

## 2018-08-19 ENCOUNTER — Ambulatory Visit: Payer: BC Managed Care – PPO | Admitting: Psychiatry

## 2018-08-19 ENCOUNTER — Other Ambulatory Visit: Payer: Self-pay

## 2018-08-19 ENCOUNTER — Encounter: Payer: Self-pay | Admitting: Psychiatry

## 2018-08-19 VITALS — BP 152/87 | HR 102 | Temp 98.6°F | Wt 302.2 lb

## 2018-08-19 DIAGNOSIS — F330 Major depressive disorder, recurrent, mild: Secondary | ICD-10-CM | POA: Diagnosis not present

## 2018-08-19 DIAGNOSIS — F411 Generalized anxiety disorder: Secondary | ICD-10-CM

## 2018-08-19 MED ORDER — VILAZODONE HCL 10 MG PO TABS: 10.0000 mg | ORAL_TABLET | Freq: Every day | ORAL | 0 refills | Status: DC

## 2018-08-19 NOTE — Progress Notes (Signed)
[facility] MD OP Progress Note  08/19/2018 5:01 PM Lindsey Strickland  MRN:  [id number]  Chief Complaint: ' I am here for follow up.' Chief Complaint    Follow-up; Medication Refill     HPI: Lindsey Strickland is a 43 year old Caucasian female who has a history of depression, anxiety, diabetes mellitus, hypertension, hyperlipidemia, history of endometrial hyperplasia being treated with IUD, PMS, presented to the clinic today for a follow-up visit.  Patient today returns for an appointment.  Her most recent appointment was on 08/05/2018.  Patient today reports she continues to struggle with mood lability.  She reports her work continues to be a problem since she has relationship struggles with a Mudlogger.  She reports there are times when she is frustrated and irritable.  There are also times when she has passive thoughts of death wish.  She however reports that has been chronic and she has learned how to distract herself and cope with it.  She denies any active plans.  She denies any suicidality now.  She denies any suicide attempts.  Some time was spent with patient discussing her GeneSight testing report.  Provided a summary of the test to the patient.  Medication choices were made today based on testing results.  Discussed starting her on Viibryd.  She agrees with plan.  Provided her medication education.   Visit Diagnosis:    ICD-10-CM   1. GAD (generalized anxiety disorder) F41.1 Vilazodone HCl (VIIBRYD) 10 MG TABS  2. MDD (major depressive disorder), recurrent episode, mild (HCC) F33.0 Vilazodone HCl (VIIBRYD) 10 MG TABS    Past Psychiatric History: I have reviewed past psychiatric history from my progress note on 08/05/2018.  Past trials of Celexa, Zoloft.  Past Medical History:  Past Medical History:  Diagnosis Date  . Atypical endometrial hyperplasia   . Diabetes mellitus ([place])   . Diarrhea   . Environmental allergies   . Generalized anxiety disorder   . GERD (gastroesophageal reflux disease)   .  Hypercholesterolemia   . Hypertension   . Insomnia   . Major depressive disorder     Past Surgical History:  Procedure Laterality Date  . COLONOSCOPY    . COLONOSCOPY WITH PROPOFOL N/A 05/28/2018   Procedure: COLONOSCOPY WITH PROPOFOL;  Surgeon: Manya Silvas, MD;  Location[facility] ENDOSCOPY;  Service: Endoscopy;  Laterality: N/A;  . ESOPHAGOGASTRODUODENOSCOPY (EGD) WITH PROPOFOL N/A 05/28/2018   Procedure: ESOPHAGOGASTRODUODENOSCOPY (EGD) WITH PROPOFOL;  Surgeon: Manya Silvas, MD;  Location: [facility] ENDOSCOPY;  Service: Endoscopy;  Laterality: N/A;  . HYSTEROSCOPY W/D&C     and polyp removal    Family Psychiatric History: Reviewed family psychiatric history from my progress note on 08/05/2018.  Family History:  Family History  Problem Relation Age of Onset  . Hypertension Mother   . Diabetes Mother   . Diverticulosis Mother   . Depression Mother   . Hypercholesterolemia Father   . Hypertension Father   . Cancer Father   . Heart disease Maternal Grandfather   . Diverticulosis Paternal Grandmother   . Hyperlipidemia Paternal Grandmother   . Heart failure Paternal Grandmother   . Cancer Maternal Grandmother   . Alcoholism Paternal Uncle     Social History: I have reviewed social history from my progress note on 08/05/2018 Social History   Socioeconomic History  . Marital status: Single    Spouse name: Not on file  . Number of children: 2  . Years of education: Not on file  . Highest education level: High school graduate  Occupational History    Employer: [employer]  Social Needs  . Financial resource strain: Not hard at all  . Food insecurity:    Worry: Never true    Inability: Never true  . Transportation needs:    Medical: No    Non-medical: No  Tobacco Use  . Smoking status: Former Smoker    Last attempt to quit: 08/06/2000    Years since quitting: 18.0  . Smokeless tobacco: Never Used  Substance and Sexual Activity  . Alcohol use: Yes     Alcohol/week: 0.0 standard drinks    Comment: rare  . Drug use: No  . Sexual activity: Yes    Birth control/protection: I.U.D.  Lifestyle  . Physical activity:    Days per week: Not on file    Minutes per session: Not on file  . Stress: Not on file  Relationships  . Social connections:    Talks on phone: Not on file    Gets together: Not on file    Attends religious service: 1 to 4 times per year    Active member of club or organization: No    Attends meetings of clubs or organizations: Never    Relationship status: Living with partner  Other Topics Concern  . Not on file  Social History Narrative  . Not on file    Allergies:  Allergies  Allergen Reactions  . Tetracycline Other (See Comments)    Other reaction(s): Unknown   . Augmentin [Amoxicillin-Pot Clavulanate]     GI intolerance   . Hctz [Hydrochlorothiazide]   . Lisinopril     Other reaction(s): Other (See Comments) cough  . Other     Mycins  . Prednisone     Chest pain  . Ciprofloxacin Nausea Only and Palpitations    Metabolic Disorder Labs: Lab Results  Component Value Date   HGBA1C 7.0 (A) 08/04/2018   No results found for: PROLACTIN Lab Results  Component Value Date   CHOL 197 04/15/2018   TRIG 317.0 (H) 04/15/2018   HDL 41.00 04/15/2018   CHOLHDL 5 04/15/2018   VLDL 63.4 (H) 04/15/2018   LDLCALC 114 (H) 12/15/2015   LDLCALC 89 08/18/2014   Lab Results  Component Value Date   TSH 2.89 04/15/2018   TSH 3.23 04/02/2016    Therapeutic Level Labs: No results found for: LITHIUM No results found for: VALPROATE No components found for:  CBMZ  Current Medications: Current Outpatient Medications  Medication Sig Dispense Refill  . albuterol (PROVENTIL HFA;VENTOLIN HFA) 108 (90 Base) MCG/ACT inhaler Inhale 2 puffs into the lungs every 6 (six) hours as needed for wheezing. 1 Inhaler 1  . amLODipine (NORVASC) 10 MG tablet TAKE 1/2 (ONE-HALF) TABLET BY MOUTH TWICE DAILY 90 tablet 0  . ferrous  sulfate 325 (65 FE) MG tablet Take 325 mg by mouth daily with breakfast.    . fluticasone (FLONASE) 50 MCG/ACT nasal spray Place 2 sprays into both nostrils daily. 16 g 3  . glucose blood test strip 1 each by Other route 2 (two) times daily. Use as instructed, use one strip twice a day    . hydrOXYzine (ATARAX/VISTARIL) 25 MG tablet Take 0.5-1 tablets (12.5-25 mg total) by mouth 2 (two) times daily as needed (severe anxiety sx). 30 tablet 0  . iron polysaccharides (NU-IRON) 150 MG capsule Take 1 capsule (150 mg total) by mouth daily. 30 capsule 2  . Lancets MISC by Does not apply route. Use 1 lancets three times a day    .  loratadine (ALAVERT) 10 MG tablet Take 10 mg by mouth daily.    Marland Kitchen losartan (COZAAR) 100 MG tablet TAKE 1 TABLET BY MOUTH ONCE DAILY. MUST KEEP NEXT APPOINTMENT 90 tablet 0  . metFORMIN (GLUCOPHAGE-XR) 500 MG 24 hr tablet TAKE 2 TABLETS BY MOUTH TWICE DAILY 360 tablet 0  . metoprolol succinate (TOPROL-XL) 100 MG 24 hr tablet TAKE 1/2 (ONE-HALF) TABLET BY MOUTH TWICE DAILY 90 tablet 3  . omeprazole (PRILOSEC OTC) 20 MG tablet Take 20 mg by mouth daily.    . pravastatin (PRAVACHOL) 40 MG tablet TAKE 1 TABLET BY MOUTH ONCE DAILY. MUST KEEP NEXT APPOINTMENT 90 tablet 0  . ranitidine (ZANTAC) 150 MG tablet TAKE 1 TABLET BY MOUTH ONCE DAILY 90 tablet 0  . Vilazodone HCl (VIIBRYD) 10 MG TABS Take 1 tablet (10 mg total) by mouth daily. 30 tablet 0   No current facility-administered medications for this visit.      Musculoskeletal: Strength & Muscle Tone: within normal limits Gait & Station: normal Patient leans: N/A  Psychiatric Specialty Exam: Review of Systems  Psychiatric/Behavioral: Positive for depression. The patient is nervous/anxious.   All other systems reviewed and are negative.   Blood pressure (!) 152/87, pulse (!) 102, temperature 98.6 F (37 C), temperature source Oral, weight (!) 302 lb 3.2 oz (137.1 kg).Body mass index is 46.63 kg/m.  General Appearance:  Casual  Eye Contact:  Fair  Speech:  Clear and Coherent  Volume:  Normal  Mood:  Anxious and Depressed  Affect:  Congruent  Thought Process:  Goal Directed and Descriptions of Associations: Intact  Orientation:  Full (Time, Place, and Person)  Thought Content: Logical   Suicidal Thoughts:  No  Homicidal Thoughts:  No  Memory:  Immediate;   Fair Recent;   Fair Remote;   Fair  Judgement:  Fair  Insight:  Fair  Psychomotor Activity:  Normal  Concentration:  Concentration: Fair and Attention Span: Fair  Recall:  [employer] of Knowledge: Fair  Language: Fair  Akathisia:  No  Handed:  Right  AIMS (if indicated): Denies tremors, rigidity, stiffness  Assets:  Communication Skills Desire for Improvement Social Support  ADL's:  Intact  Cognition: WNL  Sleep:  Fair   Screenings: PHQ2-9     Office Visit from 08/24/2013 in [place]  PHQ-2 Total Score  0       Assessment and Plan: Lindsey Strickland is a 43 year old Caucasian female, employed, Ship broker at [employer], lives with her partner in [place], has a history of depression, anxiety, hypertension, hyperlipidemia, endometrial hyperplasia, currently on IUD, presented to the clinic today for a follow-up visit.  Patient is biologically predisposed given her history of mental health problems.  She also has psychosocial stressors of work-related problems, relationship conflicts within her family as well as being a Ship broker.  Patient continues to struggle with depressive symptoms and anxiety symptoms.  She will continue psychotherapy sessions at this time.  Continue plan as noted below.  Plan For MDD-unstable Start Viibryd 10 mg p.o. daily. Continue CBT.  GAD-unstable Viibryd 10 mg p.o. daily Hydroxyzine 12.5 to 25 mg p.o. twice daily PRN for severe anxiety symptoms.  Rule out personality disorder-borderline We will continue to monitor patient closely.  She will continue CBT with [facility].  Discussed with patient to  sign a consent to obtain medical records from Dr. Nicolasa Ducking as well as [facility].  GeneSight testing results were reviewed and discussed with patient today.  Pt with elevated  blood pressure reading today-will refer back to primary medical doctor.  Follow-up in clinic in 3 weeks or sooner if needed.  I have spent atleast 25 minutes face to face with patient today. More than 50 % of the time was spent for psychoeducation and supportive psychotherapy and care coordination.  This note was generated in part or whole with voice recognition software. Voice recognition is usually quite accurate but there are transcription errors that can and very often do occur. I apologize for any typographical errors that were not detected and corrected.            Ursula Alert, MD 08/19/2018, 5:01 PM

## 2018-08-19 NOTE — Patient Instructions (Signed)
Vilazodone oral tablet What is this medicine? VILAZODONE (vil [place] oh done) is used to treat depression. This medicine may be used for other purposes; ask your health care provider or pharmacist if you have questions. COMMON BRAND NAME(S): VIIBRYD What should I tell my health care provider before I take this medicine? They need to know if you have any of these conditions: -bipolar disorder or a family history of bipolar disorder -glaucoma -liver disease -low levels of sodium in the blood -receiving electroconvulsive therapy -seizures (convulsions) -suicidal thoughts, plans, or attempt by you or a family member -an unusual or allergic reaction to vilazodone, other medicines, foods, dyes or preservatives -pregnant or trying to get pregnant -breast-feeding How should I use this medicine? Take this medicine by mouth with a glass of water. Follow the directions on the prescription label. Take this medicine with food. Take your medicine at regular intervals. Do not take your medicine more often than directed. Do not stop taking this medicine suddenly except upon the advice of your doctor. Stopping this medicine too quickly may cause serious side effects or your condition may worsen. A special MedGuide will be given to you by the pharmacist with each prescription and refill. Be sure to read this information carefully each time. Overdosage: If you think you have taken too much of this medicine contact a poison control center or emergency room at once. NOTE: This medicine is only for you. Do not share this medicine with others. What if I miss a dose? If you miss a dose, take it as soon as you can. If it is almost time for your next dose, take only that dose. Do not take double or extra doses. What may interact with this medicine? Do not take this medicine with any of the following medications: -linezolid -MAOIs like Carbex, Eldepryl, Marplan, Nardil, and Parnate -methylene blue (injected into a  vein) This medicine may also interact with the following medications: -amphetamines -aspirin and aspirin-like medicines -buspirone -certain diet drugs like dexfenfluramine, fenfluramine, phentermine, sibutramine -certain migraine headache medicines like almotriptan, eletriptan, frovatriptan, naratriptan, rizatriptan, sumatriptan, zolmitriptan -certain medicines that treat or prevent blood clots like warfarin, enoxaparin, and dalteparin -certain medicines that treat infections like clarithromycin, itraconazole, voriconazole, ketoconazole, rifampin -certain medicines that treat seizures like carbamazepine and phenytoin -digoxin -fentanyl -lithium -NSAIDS, medicines for pain and inflammation, like ibuprofen or naproxen -other medicines for depression, anxiety, or psychotic disturbances -St. John's Wort -tramadol -tryptophan This list may not describe all possible interactions. Give your health care provider a list of all the medicines, herbs, non-prescription drugs, or dietary supplements you use. Also tell them if you smoke, drink alcohol, or use illegal drugs. Some items may interact with your medicine. What should I watch for while using this medicine? Tell your doctor if your symptoms do not get better or if they get worse. Visit your doctor or health care professional for regular checks on your progress. Because it may take several weeks to see the full effects of this medicine, it is important to continue your treatment as prescribed by your doctor. Patients and their families should watch out for new or worsening thoughts of suicide or depression. Also watch out for sudden changes in feelings such as feeling anxious, agitated, panicky, irritable, hostile, aggressive, impulsive, severely restless, overly excited and hyperactive, or not being able to sleep. If this happens, especially at the beginning of treatment or after a change in dose, call your health care professional. You may get  drowsy or dizzy. Do   not drive, use machinery, or do anything that needs mental alertness until you know how this medicine affects you. Do not stand or sit up quickly, especially if you are an older patient. This reduces the risk of dizzy or fainting spells. Alcohol may interfere with the effect of this medicine. Avoid alcoholic drinks. Your mouth may get dry. Chewing sugarless gum or sucking hard candy, and drinking plenty of water may help. Contact your doctor if the problem does not go away or is severe. What side effects may I notice from receiving this medicine? Side effects that you should report to your doctor or health care professional as soon as possible: -allergic reactions like skin rash, itching or hives, swelling of the face, lips, or tongue -anxious -black, tarry stools -changes in vision -confusion -elevated mood, decreased need for sleep, racing thoughts, impulsive behavior -eye pain -fast, irregular heartbeat -feeling faint or lightheaded, falls -feeling agitated, angry, or irritable -hallucination, loss of contact with reality -loss of balance or coordination -loss of memory -restlessness, pacing, inability to keep still -seizures -stiff muscles -suicidal thoughts or other mood changes -trouble sleeping -unusual bleeding or bruising -unusually weak or tired -vomiting Side effects that usually do not require medical attention (report to your doctor or health care professional if they continue or are bothersome): -change in appetite or weight -change in sex drive or performance -diarrhea -drowsiness -dry mouth -increased sweating -nausea -tremors This list may not describe all possible side effects. Call your doctor for medical advice about side effects. You may report side effects to FDA at 1-800-FDA-1088. Where should I keep my medicine? Keep out of the reach of children. Store at room temperature between 15 and 30 degrees C (59 to 86 degrees F). Throw away any  unused medicine after the expiration date. NOTE: This sheet is a summary. It may not cover all possible information. If you have questions about this medicine, talk to your doctor, pharmacist, or health care provider.  2019 Elsevier/Gold Standard (2016-03-20 12:50:48)

## 2018-09-08 ENCOUNTER — Ambulatory Visit: Payer: BC Managed Care – PPO | Admitting: Pharmacist

## 2018-09-10 ENCOUNTER — Ambulatory Visit: Payer: BC Managed Care – PPO | Admitting: Psychiatry

## 2018-09-15 ENCOUNTER — Other Ambulatory Visit: Payer: Self-pay | Admitting: Internal Medicine

## 2018-09-23 ENCOUNTER — Encounter: Payer: Self-pay | Admitting: Internal Medicine

## 2018-09-24 ENCOUNTER — Other Ambulatory Visit: Payer: BC Managed Care – PPO

## 2018-09-24 ENCOUNTER — Encounter: Payer: BC Managed Care – PPO | Admitting: Internal Medicine

## 2018-09-24 NOTE — Telephone Encounter (Signed)
done

## 2018-09-24 NOTE — Telephone Encounter (Signed)
Ok to cancel appt and no charge pt.

## 2018-10-08 ENCOUNTER — Ambulatory Visit: Payer: BC Managed Care – PPO | Admitting: Psychiatry

## 2018-10-09 ENCOUNTER — Encounter: Payer: Self-pay | Admitting: Psychiatry

## 2018-10-09 ENCOUNTER — Ambulatory Visit: Payer: BC Managed Care – PPO | Admitting: Psychiatry

## 2018-10-09 ENCOUNTER — Other Ambulatory Visit: Payer: Self-pay

## 2018-10-09 VITALS — BP 172/97 | HR 102 | Temp 98.3°F | Wt 303.4 lb

## 2018-10-09 DIAGNOSIS — F330 Major depressive disorder, recurrent, mild: Secondary | ICD-10-CM | POA: Diagnosis not present

## 2018-10-09 DIAGNOSIS — F411 Generalized anxiety disorder: Secondary | ICD-10-CM | POA: Diagnosis not present

## 2018-10-09 NOTE — Progress Notes (Signed)
[facility] MD OP Progress Note  10/09/2018 5:05 PM Lindsey Strickland  MRN:  [id number]  Chief Complaint: ' I am here for follow up." Chief Complaint    Follow-up; Medication Refill     HPI: Lindsey Strickland is a 43 yr old Caucasian female, has a history of depression, anxiety, diabetes melitis, hypertension, hyperlipidemia, history of endometrial hyperplasia being treated with IUD, PMS, presented to clinic today for a follow-up visit.  Patient today reports she has not started her Viibryd yet.  She reports she has this anxiety about starting medications in general.  She is currently working with her therapist Sharyn Lull at [facility] on the same.  Patient reports her mood symptoms have slightly improved with therapy sessions.  She however plans to take the medications.  Patient reports sleep is fair.  Patient continues to have psychosocial stressors of her daughter having behavioral problems.  She reports she is currently working on the same with her therapist.  Discussed with patient to start [place] and make an appointment to be seen in clinic after taking it for 2 weeks.  Patient agrees with plan.  Also discussed with patient to sign a release to obtain medical records from her [facility] therapist-Ms. Roseanna Rainbow at [phone].   Visit Diagnosis:    ICD-10-CM   1. GAD (generalized anxiety disorder) F41.1   2. MDD (major depressive disorder), recurrent episode, mild ([place]) F33.0     Past Psychiatric History: I have reviewed past psychiatric history from my progress note on 08/05/2018.  Past trials of Celexa, Zoloft  Past Medical History:  Past Medical History:  Diagnosis Date  . Atypical endometrial hyperplasia   . Diabetes mellitus ([place])   . Diarrhea   . Environmental allergies   . Generalized anxiety disorder   . GERD (gastroesophageal reflux disease)   . Hypercholesterolemia   . Hypertension   . Insomnia   . Major depressive disorder     Past Surgical History:  Procedure  Laterality Date  . COLONOSCOPY    . COLONOSCOPY WITH PROPOFOL N/A 05/28/2018   Procedure: COLONOSCOPY WITH PROPOFOL;  Surgeon: Manya Silvas, MD;  Location: [facility] ENDOSCOPY;  Service: Endoscopy;  Laterality: N/A;  . ESOPHAGOGASTRODUODENOSCOPY (EGD) WITH PROPOFOL N/A 05/28/2018   Procedure: ESOPHAGOGASTRODUODENOSCOPY (EGD) WITH PROPOFOL;  Surgeon: Manya Silvas, MD;  Location: [facility] ENDOSCOPY;  Service: Endoscopy;  Laterality: N/A;  . HYSTEROSCOPY W/D&C     and polyp removal    Family Psychiatric History: I have reviewed family psychiatric history from my progress note on 08/05/2018  Family History:  Family History  Problem Relation Age of Onset  . Hypertension Mother   . Diabetes Mother   . Diverticulosis Mother   . Depression Mother   . Hypercholesterolemia Father   . Hypertension Father   . Cancer Father   . Heart disease Maternal Grandfather   . Diverticulosis Paternal Grandmother   . Hyperlipidemia Paternal Grandmother   . Heart failure Paternal Grandmother   . Cancer Maternal Grandmother   . Alcoholism Paternal Uncle     Social History: Reviewed social history from my progress note on 08/05/2018 Social History   Socioeconomic History  . Marital status: Single    Spouse name: Not on file  . Number of children: 2  . Years of education: Not on file  . Highest education level: High school graduate  Occupational History    Employer: [employer]  Social Needs  . Financial resource strain: Not hard at all  . Food insecurity:  Worry: Never true    Inability: Never true  . Transportation needs:    Medical: No    Non-medical: No  Tobacco Use  . Smoking status: Former Smoker    Last attempt to quit: 08/06/2000    Years since quitting: 18.1  . Smokeless tobacco: Never Used  Substance and Sexual Activity  . Alcohol use: Yes    Alcohol/week: 0.0 standard drinks    Comment: rare  . Drug use: No  . Sexual activity: Yes    Birth control/protection: I.U.D.   Lifestyle  . Physical activity:    Days per week: Not on file    Minutes per session: Not on file  . Stress: Not on file  Relationships  . Social connections:    Talks on phone: Not on file    Gets together: Not on file    Attends religious service: 1 to 4 times per year    Active member of club or organization: No    Attends meetings of clubs or organizations: Never    Relationship status: Living with partner  Other Topics Concern  . Not on file  Social History Narrative  . Not on file    Allergies:  Allergies  Allergen Reactions  . Tetracycline Other (See Comments)    Other reaction(s): Unknown   . Augmentin [Amoxicillin-Pot Clavulanate]     GI intolerance   . Hctz [Hydrochlorothiazide]   . Lisinopril     Other reaction(s): Other (See Comments) cough  . Other     Mycins  . Prednisone     Chest pain  . Ciprofloxacin Nausea Only and Palpitations    Metabolic Disorder Labs: Lab Results  Component Value Date   HGBA1C 7.0 (A) 08/04/2018   No results found for: PROLACTIN Lab Results  Component Value Date   CHOL 197 04/15/2018   TRIG 317.0 (H) 04/15/2018   HDL 41.00 04/15/2018   CHOLHDL 5 04/15/2018   VLDL 63.4 (H) 04/15/2018   LDLCALC 114 (H) 12/15/2015   LDLCALC 89 08/18/2014   Lab Results  Component Value Date   TSH 2.89 04/15/2018   TSH 3.23 04/02/2016    Therapeutic Level Labs: No results found for: LITHIUM No results found for: VALPROATE No components found for:  CBMZ  Current Medications: Current Outpatient Medications  Medication Sig Dispense Refill  . albuterol (PROVENTIL HFA;VENTOLIN HFA) 108 (90 Base) MCG/ACT inhaler Inhale 2 puffs into the lungs every 6 (six) hours as needed for wheezing. 1 Inhaler 1  . amLODipine (NORVASC) 10 MG tablet TAKE 1/2 (ONE-HALF) TABLET BY MOUTH TWICE DAILY 90 tablet 0  . cefdinir (OMNICEF) 300 MG capsule Take by mouth.    . famotidine (PEPCID) 20 MG tablet TAKE 2 TABLETS (40 MG) BY MOUTH NIGHTLY    . ferrous  sulfate 325 (65 FE) MG tablet Take 325 mg by mouth daily with breakfast.    . fluticasone (FLONASE) 50 MCG/ACT nasal spray Place 2 sprays into both nostrils daily. 16 g 3  . glucose blood test strip 1 each by Other route 2 (two) times daily. Use as instructed, use one strip twice a day    . hydrOXYzine (ATARAX/VISTARIL) 25 MG tablet Take 0.5-1 tablets (12.5-25 mg total) by mouth 2 (two) times daily as needed (severe anxiety sx). 30 tablet 0  . iron polysaccharides (NU-IRON) 150 MG capsule Take 1 capsule (150 mg total) by mouth daily. 30 capsule 2  . Lancets MISC by Does not apply route. Use 1 lancets three times a  day    . loratadine (ALAVERT) 10 MG tablet Take 10 mg by mouth daily.    Marland Kitchen losartan (COZAAR) 100 MG tablet TAKE 1 TABLET BY MOUTH ONCE DAILY. MUST KEEP NEXT APPOINTMENT 90 tablet 0  . metFORMIN (GLUCOPHAGE-XR) 500 MG 24 hr tablet TAKE 2 TABLETS BY MOUTH TWICE DAILY 360 tablet 0  . metoprolol succinate (TOPROL-XL) 100 MG 24 hr tablet TAKE 1/2 (ONE-HALF) TABLET BY MOUTH TWICE DAILY 90 tablet 3  . omeprazole (PRILOSEC OTC) 20 MG tablet Take 20 mg by mouth daily.    . pravastatin (PRAVACHOL) 40 MG tablet TAKE 1 TABLET BY MOUTH ONCE DAILY. MUST KEEP NEXT APPOINTMENT. 90 tablet 0  . Vilazodone HCl (VIIBRYD) 10 MG TABS Take 1 tablet (10 mg total) by mouth daily. 30 tablet 0   No current facility-administered medications for this visit.      Musculoskeletal: Strength & Muscle Tone: within normal limits Gait & Station: normal Patient leans: N/A  Psychiatric Specialty Exam: Review of Systems  Psychiatric/Behavioral: The patient is nervous/anxious.   All other systems reviewed and are negative.   Blood pressure (!) 172/97, pulse (!) 102, temperature 98.3 F (36.8 C), temperature source Oral, weight (!) 303 lb 6.4 oz (137.6 kg).Body mass index is 46.82 kg/m.  General Appearance: Casual  Eye Contact:  Fair  Speech:  Clear and Coherent  Volume:  Normal  Mood:  Anxious  Affect:   Congruent  Thought Process:  Goal Directed and Descriptions of Associations: Intact  Orientation:  Full (Time, Place, and Person)  Thought Content: Logical   Suicidal Thoughts:  No  Homicidal Thoughts:  No  Memory:  Immediate;   Fair Recent;   Fair Remote;   Fair  Judgement:  Fair  Insight:  Fair  Psychomotor Activity:  Normal  Concentration:  Concentration: Fair and Attention Span: Fair  Recall:  [employer] of Knowledge: Fair  Language: Fair  Akathisia:  No  Handed:  Right  AIMS (if indicated): Denies tremors, rigidity, stiffness  Assets:  Communication Skills Desire for Improvement Social Support  ADL's:  Intact  Cognition: WNL  Sleep:  Fair   Screenings: PHQ2-9     Office Visit from 08/24/2013 in [facility]  PHQ-2 Total Score  0       Assessment and Plan: Cerys is a 43 year old Caucasian female, employed, Ship broker at [employer], lives with her partner in [place], has a history of depression, anxiety, hypertension, hyperlipidemia, endometrial hyperplasia, currently on IUD, presented to clinic today for a follow-up visit.  Patient is biologically predisposed given her history of mental health problems.  She also has psychosocial stressors of work-related problems, relationship conflicts within her family as well as being a Ship broker.  Patient is noncompliant on her medications.  She however continues to be in therapy sessions.  She continues to struggle with anxiety symptoms.  Plan as noted below.  Plan For MDD-unstable Patient is noncompliant on medications. Discussed with patient to start Viibryd 10 mg p.o. daily and return to clinic after 2 weeks of starting the medication. She will continue psychotherapy session with [facility] in the meantime. Discussed with patient to sign a release to coordinate care with [facility]-Michelle VanHorton at [phone].  Rule out personality disorder-borderline Patient to continue therapy  sessions.  I have reviewed medical records from Dr. Nicolasa Ducking-' dated February 03, 2018-, November 12, 2017, October 22, 2017-with history of recurrent depression, anxiety and insomnia.  Patient was tried on medications like Celexa, melatonin,  Zoloft.  Patient at that time was also getting therapy sessions from [facility].  Patient however was terminated for being disrespectful and demanding disability application completion.'  Follow-up in clinic in 2 weeks after starting Viibryd.  Some time was spent providing education, encouraging compliance with treatment.  Patient with elevated blood pressure reading today we will continue to follow-up with her primary medical doctor.   I have spent atleast 15 minutes  face to face with patient today. More than 50 % of the time was spent for psychoeducation and supportive psychotherapy and care coordination.  This note was generated in part or whole with voice recognition software. Voice recognition is usually quite accurate but there are transcription errors that can and very often do occur. I apologize for any typographical errors that were not detected and corrected.             Ursula Alert, MD 10/09/2018, 5:05 PM

## 2018-10-17 ENCOUNTER — Other Ambulatory Visit: Payer: Self-pay

## 2018-10-17 ENCOUNTER — Telehealth: Payer: Self-pay | Admitting: Psychiatry

## 2018-10-17 ENCOUNTER — Encounter: Payer: Self-pay | Admitting: Internal Medicine

## 2018-10-17 MED ORDER — ALBUTEROL SULFATE HFA 108 (90 BASE) MCG/ACT IN AERS: 2.0000 | INHALATION_SPRAY | Freq: Four times a day (QID) | RESPIRATORY_TRACT | 1 refills | Status: DC | PRN

## 2018-10-17 NOTE — Telephone Encounter (Signed)
Attempted to call therapist Sharyn Lull as per discussion with patient- unable to reach.

## 2018-10-19 ENCOUNTER — Other Ambulatory Visit: Payer: Self-pay | Admitting: Internal Medicine

## 2018-11-23 ENCOUNTER — Emergency Department
Admission: EM | Admit: 2018-11-23 | Discharge: 2018-11-23 | Disposition: A | Discharge status: Home / Self Care | Payer: BC Managed Care – PPO | Attending: Emergency Medicine | Admitting: Emergency Medicine

## 2018-11-23 ENCOUNTER — Other Ambulatory Visit: Payer: Self-pay

## 2018-11-23 ENCOUNTER — Encounter: Payer: Self-pay | Admitting: Emergency Medicine

## 2018-11-23 DIAGNOSIS — E119 Type 2 diabetes mellitus without complications: Secondary | ICD-10-CM | POA: Insufficient documentation

## 2018-11-23 DIAGNOSIS — Z87891 Personal history of nicotine dependence: Secondary | ICD-10-CM | POA: Diagnosis not present

## 2018-11-23 DIAGNOSIS — Z79899 Other long term (current) drug therapy: Secondary | ICD-10-CM | POA: Diagnosis not present

## 2018-11-23 DIAGNOSIS — I10 Essential (primary) hypertension: Secondary | ICD-10-CM | POA: Insufficient documentation

## 2018-11-23 DIAGNOSIS — H1131 Conjunctival hemorrhage, right eye: Secondary | ICD-10-CM

## 2018-11-23 DIAGNOSIS — S0590XA Unspecified injury of unspecified eye and orbit, initial encounter: Secondary | ICD-10-CM

## 2018-11-23 DIAGNOSIS — H5711 Ocular pain, right eye: Secondary | ICD-10-CM | POA: Diagnosis present

## 2018-11-23 MED ORDER — TETRACAINE HCL 0.5 % OP SOLN
2.0000 [drp] | Freq: Once | OPHTHALMIC | Status: AC
  Administered 2018-11-23: 2 [drp] via OPHTHALMIC
  Filled 2018-11-23: qty 4

## 2018-11-23 MED ORDER — FLUORESCEIN SODIUM 1 MG OP STRP
1.0000 | ORAL_STRIP | Freq: Once | OPHTHALMIC | Status: AC
  Administered 2018-11-23: 16:00:00 1 via OPHTHALMIC
  Filled 2018-11-23: qty 1

## 2018-11-23 MED ORDER — NEOMYCIN-POLYMYXIN-DEXAMETH 3.5-10000-0.1 OP SUSP: 2.0000 [drp] | Freq: Four times a day (QID) | OPHTHALMIC | 0 refills | Status: DC

## 2018-11-23 NOTE — Discharge Instructions (Addendum)
Please return the emergency department immediately if you begin to notice any worsening changes in your vision.  Please call Dr. Neville Route tomorrow for an appointment this week.
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
## 2018-11-24 ENCOUNTER — Other Ambulatory Visit: Payer: Self-pay | Admitting: Internal Medicine

## 2018-12-15 ENCOUNTER — Other Ambulatory Visit: Payer: Self-pay | Admitting: Internal Medicine

## 2019-01-08 LAB — HM DIABETES EYE EXAM

## 2019-01-19 ENCOUNTER — Other Ambulatory Visit: Payer: Self-pay | Admitting: Internal Medicine

## 2019-01-20 NOTE — Telephone Encounter (Signed)
Refill request for Metformin Extended Release

## 2019-01-21 ENCOUNTER — Other Ambulatory Visit: Payer: Self-pay | Admitting: Internal Medicine

## 2019-01-21 NOTE — Telephone Encounter (Signed)
Notify pt that some lots of metfromin XR are being recalled.  I would like to change her to regular metformin 500mg .  If agreeable, ok to refill metformin 500mg  (2 tablets bid) x 1 only.  She has to keep her scheduled appt in 02/2019.

## 2019-01-22 ENCOUNTER — Encounter: Payer: Self-pay | Admitting: Internal Medicine

## 2019-01-22 MED ORDER — METFORMIN HCL 500 MG PO TABS: ORAL_TABLET | ORAL | 0 refills | Status: DC

## 2019-01-22 NOTE — Telephone Encounter (Signed)
Refill request for Metformin extended release. 

## 2019-01-22 NOTE — Telephone Encounter (Signed)
rx sent in for metformin 500mg  #120 with no refills.  Changed to regular metformin.  Pt notified via my chart.  Keep scheduled appt.

## 2019-01-22 NOTE — Telephone Encounter (Signed)
Already addressed.  See my chart message.

## 2019-02-08 DIAGNOSIS — K2100 Gastro-esophageal reflux disease with esophagitis, without bleeding: Secondary | ICD-10-CM | POA: Insufficient documentation

## 2019-02-09 ENCOUNTER — Other Ambulatory Visit (INDEPENDENT_AMBULATORY_CARE_PROVIDER_SITE_OTHER): Payer: BC Managed Care – PPO

## 2019-02-09 ENCOUNTER — Ambulatory Visit (INDEPENDENT_AMBULATORY_CARE_PROVIDER_SITE_OTHER): Payer: BC Managed Care – PPO | Admitting: Internal Medicine

## 2019-02-09 ENCOUNTER — Encounter: Payer: Self-pay | Admitting: Internal Medicine

## 2019-02-09 ENCOUNTER — Other Ambulatory Visit: Payer: Self-pay

## 2019-02-09 DIAGNOSIS — D509 Iron deficiency anemia, unspecified: Secondary | ICD-10-CM

## 2019-02-09 DIAGNOSIS — D473 Essential (hemorrhagic) thrombocythemia: Secondary | ICD-10-CM

## 2019-02-09 DIAGNOSIS — E119 Type 2 diabetes mellitus without complications: Secondary | ICD-10-CM

## 2019-02-09 DIAGNOSIS — D75839 Thrombocytosis, unspecified: Secondary | ICD-10-CM

## 2019-02-09 DIAGNOSIS — F439 Reaction to severe stress, unspecified: Secondary | ICD-10-CM

## 2019-02-09 DIAGNOSIS — E7800 Pure hypercholesterolemia, unspecified: Secondary | ICD-10-CM

## 2019-02-09 DIAGNOSIS — I10 Essential (primary) hypertension: Secondary | ICD-10-CM

## 2019-02-09 LAB — CBC WITH DIFFERENTIAL/PLATELET
Basophils Absolute: 0.1 10*3/uL (ref 0.0–0.1)
Basophils Relative: 1.1 % (ref 0.0–3.0)
Eosinophils Absolute: 0.2 10*3/uL (ref 0.0–0.7)
Eosinophils Relative: 1.6 % (ref 0.0–5.0)
HCT: 40.4 % (ref 36.0–46.0)
Hemoglobin: 13 g/dL (ref 12.0–15.0)
Lymphocytes Relative: 25.9 % (ref 12.0–46.0)
Lymphs Abs: 2.6 10*3/uL (ref 0.7–4.0)
MCHC: 32.3 g/dL (ref 30.0–36.0)
MCV: 84.2 fl (ref 78.0–100.0)
Monocytes Absolute: 0.6 10*3/uL (ref 0.1–1.0)
Monocytes Relative: 6.2 % (ref 3.0–12.0)
Neutro Abs: 6.5 10*3/uL (ref 1.4–7.7)
Neutrophils Relative %: 65.2 % (ref 43.0–77.0)
Platelets: 410 10*3/uL — ABNORMAL HIGH (ref 150.0–400.0)
RBC: 4.8 Mil/uL (ref 3.87–5.11)
RDW: 15.3 % (ref 11.5–15.5)
WBC: 9.9 10*3/uL (ref 4.0–10.5)

## 2019-02-09 LAB — BASIC METABOLIC PANEL
BUN: 12 mg/dL (ref 6–23)
CO2: 25 mEq/L (ref 19–32)
Calcium: 9.3 mg/dL (ref 8.4–10.5)
Chloride: 101 mEq/L (ref 96–112)
Creatinine, Ser: 0.48 mg/dL (ref 0.40–1.20)
GFR: 142.86 mL/min (ref >60.00)
Glucose, Bld: 164 mg/dL — ABNORMAL HIGH (ref 70–99)
Potassium: 4.7 mEq/L (ref 3.5–5.1)
Sodium: 138 mEq/L (ref 135–145)

## 2019-02-09 LAB — SEDIMENTATION RATE: Sed Rate: 18 mm/hr (ref 0–20)

## 2019-02-09 LAB — FERRITIN: Ferritin: 13.3 ng/mL (ref 10.0–291.0)

## 2019-02-09 LAB — LIPID PANEL
Cholesterol: 173 mg/dL (ref 0–200)
HDL: 37.1 mg/dL — ABNORMAL LOW (ref >39.00)
NonHDL: 135.65
Total CHOL/HDL Ratio: 5
Triglycerides: 220 mg/dL — ABNORMAL HIGH (ref 0.0–149.0)
VLDL: 44 mg/dL — ABNORMAL HIGH (ref 0.0–40.0)

## 2019-02-09 LAB — HEPATIC FUNCTION PANEL
ALT: 20 U/L (ref 0–35)
AST: 17 U/L (ref 0–37)
Albumin: 4.2 g/dL (ref 3.5–5.2)
Alkaline Phosphatase: 67 U/L (ref 39–117)
Bilirubin, Direct: 0 mg/dL (ref 0.0–0.3)
Total Bilirubin: 0.3 mg/dL (ref 0.2–1.2)
Total Protein: 6.8 g/dL (ref 6.0–8.3)

## 2019-02-09 LAB — IBC PANEL
Iron: 49 ug/dL (ref 42–145)
Saturation Ratios: 10.4 % — ABNORMAL LOW (ref 20.0–50.0)
Transferrin: 335 mg/dL (ref 212.0–360.0)

## 2019-02-09 LAB — C-REACTIVE PROTEIN: CRP: 1.7 mg/dL (ref 0.5–20.0)

## 2019-02-09 LAB — LDL CHOLESTEROL, DIRECT: Direct LDL: 109 mg/dL

## 2019-02-09 LAB — HEMOGLOBIN A1C: Hgb A1c MFr Bld: 7.8 % — ABNORMAL HIGH (ref 4.6–6.5)

## 2019-02-09 NOTE — Progress Notes (Signed)
Patient ID: Lindsey Strickland, female   DOB: May 11, 1976, 43 y.o.   MRN: [id number]   Virtual Visit via video Note  This visit type was conducted due to national recommendations for restrictions regarding the COVID-19 pandemic (e.g. social distancing).  This format is felt to be most appropriate for this patient at this time.  All issues noted in this document were discussed and addressed.  No physical exam was performed (except for noted visual exam findings with Video Visits).   I connected with Lindsey Strickland by a video enabled telemedicine application or telephone and verified that I am speaking with the correct person using two identifiers. Location patient: home Location provider: work Persons participating in the virtual visit: patient, provider and pts husband  I discussed the limitations, risks, security and privacy concerns of performing an evaluation and management service by video and the availability of in person appointments. The patient expressed understanding and agreed to proceed.   Reason for visit: scheduled follow up.   HPI: She reports she is doing relatively well.  Seeing psychiatry - Dr Shea Evans for f/u for her anxiety.  Was instructed to start vibryd.  She has not started.  Working from home.  Handling stress with covid.  Trying to stay in due to covid restrictions.  No chest pain.  No sob.  No cough or congestion.  No acid reflux.  Has been seeing GI for persistent diarrhea.  S/p colonoscopy and EGD.  Due f/u colonoscopy 2025.  Continues to be followed by GI.  She is eating.  Discussed diet and exercise.  Discussed checking sugars.  Is off iron.  Has a history of migraine headaches.  Had headache within the last couple of weeks.  Had aura. Noticed a blind spot with her aura.  No significant change with headache.  Desires no further w/up at this time.  Followed by gyn.     ROS: See pertinent positives and negatives per HPI.  Past Medical History:  Diagnosis Date  .  Atypical endometrial hyperplasia   . Diabetes mellitus ([place])   . Diarrhea   . Environmental allergies   . Generalized anxiety disorder   . GERD (gastroesophageal reflux disease)   . Hypercholesterolemia   . Hypertension   . Insomnia   . Major depressive disorder     Past Surgical History:  Procedure Laterality Date  . COLONOSCOPY    . COLONOSCOPY WITH PROPOFOL N/A 05/28/2018   Procedure: COLONOSCOPY WITH PROPOFOL;  Surgeon: Manya Silvas, MD;  Location: [facility] ENDOSCOPY;  Service: Endoscopy;  Laterality: N/A;  . ESOPHAGOGASTRODUODENOSCOPY (EGD) WITH PROPOFOL N/A 05/28/2018   Procedure: ESOPHAGOGASTRODUODENOSCOPY (EGD) WITH PROPOFOL;  Surgeon: Manya Silvas, MD;  Location: [facility] ENDOSCOPY;  Service: Endoscopy;  Laterality: N/A;  . HYSTEROSCOPY W/D&C     and polyp removal    Family History  Problem Relation Age of Onset  . Hypertension Mother   . Diabetes Mother   . Diverticulosis Mother   . Depression Mother   . Hypercholesterolemia Father   . Hypertension Father   . Cancer Father   . Heart disease Maternal Grandfather   . Diverticulosis Paternal Grandmother   . Hyperlipidemia Paternal Grandmother   . Heart failure Paternal Grandmother   . Cancer Maternal Grandmother   . Alcoholism Paternal Uncle     SOCIAL HX: reviewed.    Current Outpatient Medications:  .  albuterol (PROVENTIL HFA;VENTOLIN HFA) 108 (90 Base) MCG/ACT inhaler, Inhale 2 puffs into the lungs every 6 (six) hours as needed  for wheezing., Disp: 1 Inhaler, Rfl: 1 .  amLODipine (NORVASC) 10 MG tablet, TAKE ONE-HALF TABLET BY MOUTH TWICE DAILY, Disp: 90 tablet, Rfl: 1 .  famotidine (PEPCID) 20 MG tablet, TAKE 2 TABLETS (40 MG) BY MOUTH NIGHTLY, Disp: , Rfl:  .  ferrous sulfate 325 (65 FE) MG tablet, Take 325 mg by mouth daily with breakfast., Disp: , Rfl:  .  fluticasone (FLONASE) 50 MCG/ACT nasal spray, Place 2 sprays into both nostrils daily., Disp: 16 g, Rfl: 3 .  glucose blood test strip, 1 each by  Other route 2 (two) times daily. Use as instructed, use one strip twice a day, Disp: , Rfl:  .  hydrOXYzine (ATARAX/VISTARIL) 25 MG tablet, Take 0.5-1 tablets (12.5-25 mg total) by mouth 2 (two) times daily as needed (severe anxiety sx)., Disp: 30 tablet, Rfl: 0 .  iron polysaccharides (NU-IRON) 150 MG capsule, Take 1 capsule (150 mg total) by mouth daily., Disp: 30 capsule, Rfl: 2 .  Lancets MISC, by Does not apply route. Use 1 lancets three times a day, Disp: , Rfl:  .  loratadine (ALAVERT) 10 MG tablet, Take 10 mg by mouth daily., Disp: , Rfl:  .  losartan (COZAAR) 100 MG tablet, TAKE 1 TABLET BY MOUTH ONCE DAILY. MUST KEEP NEXT APPOINTMENT., Disp: 90 tablet, Rfl: 0 .  metFORMIN (GLUCOPHAGE) 500 MG tablet, 2 tablets bid, Disp: 120 tablet, Rfl: 0 .  metoprolol succinate (TOPROL-XL) 100 MG 24 hr tablet, TAKE 1/2 (ONE-HALF) TABLET BY MOUTH TWICE DAILY, Disp: 90 tablet, Rfl: 3 .  neomycin-polymyxin b-dexamethasone (MAXITROL) 3.5-10000-0.1 SUSP, Place 2 drops into the right eye every 6 (six) hours., Disp: 5 mL, Rfl: 0 .  omeprazole (PRILOSEC OTC) 20 MG tablet, Take 20 mg by mouth daily., Disp: , Rfl:  .  pravastatin (PRAVACHOL) 40 MG tablet, TAKE 1 TABLET BY MOUTH ONCE DAILY. MUST KEEP NEXT APPOINTMENT., Disp: 90 tablet, Rfl: 0 .  Vilazodone HCl (VIIBRYD) 10 MG TABS, Take 1 tablet (10 mg total) by mouth daily., Disp: 30 tablet, Rfl: 0  EXAM:  GENERAL: alert, oriented, appears well and in no acute distress  HEENT: atraumatic, conjunttiva clear, no obvious abnormalities on inspection of external nose and ears  NECK: normal movements of the head and neck  LUNGS: on inspection no signs of respiratory distress, breathing rate appears normal, no obvious gross SOB, gasping or wheezing  CV: no obvious cyanosis  PSYCH/NEURO: pleasant and cooperative, no obvious depression or anxiety, speech and thought processing grossly intact  ASSESSMENT AND PLAN:  Discussed the following assessment and plan:   Diabetes mellitus On metformin.  Discussed diet and exercise.  Overdue labs.  Follow metb and a1c.  Discussed other treatment options.  Discussed SGLT2 or GLP1.  She declines.  Wants to work on diet and exercise.    Hypercholesterolemia On pravastatin.  Low cholesterol diet and exercise.  Follow lipid panel and liver function tests.    Hypertension Follow blood pressure.  Continue current medication regimen.  Follow metabolic panel.   Iron deficiency anemia Has seen Dr Mike Gip.  Follow cbc and ferritin.    Severe obesity (BMI >= 40) Discussed diet and exercise.  Follow.    Stress Increased stress.  Discussed with her today.  She feels she is handling things relatively well.  Seeing psychiatry.    Thrombocytosis ([place]) Evaluated by hematology.  flet to be reactive.  Off iron.  Follow cbc.      I discussed the assessment and treatment plan with the patient.  The patient was provided an opportunity to ask questions and all were answered. The patient agreed with the plan and demonstrated an understanding of the instructions.   The patient was advised to call back or seek an in-person evaluation if the symptoms worsen or if the condition fails to improve as anticipated.  I provided 38 minutes of non-face-to-face time during this encounter.   Einar Pheasant, MD

## 2019-02-14 ENCOUNTER — Encounter: Payer: Self-pay | Admitting: Internal Medicine

## 2019-02-14 ENCOUNTER — Other Ambulatory Visit
Admission: RE | Admit: 2019-02-14 | Discharge: 2019-02-14 | Disposition: A | Discharge status: Home / Self Care | Payer: BC Managed Care – PPO | Source: Ambulatory Visit | Attending: Nurse Practitioner | Admitting: Nurse Practitioner

## 2019-02-14 DIAGNOSIS — R197 Diarrhea, unspecified: Secondary | ICD-10-CM | POA: Diagnosis present

## 2019-02-14 LAB — C DIFFICILE QUICK SCREEN W PCR REFLEX
C Diff antigen: NEGATIVE
C Diff interpretation: NOT DETECTED
C Diff toxin: NEGATIVE

## 2019-02-14 NOTE — Assessment & Plan Note (Signed)
Evaluated by hematology.  flet to be reactive.  Off iron.  Follow cbc.

## 2019-02-14 NOTE — Assessment & Plan Note (Signed)
Has seen Dr Mike Gip.  Follow cbc and ferritin.

## 2019-02-14 NOTE — Assessment & Plan Note (Signed)
On metformin.  Discussed diet and exercise.  Overdue labs.  Follow metb and a1c.  Discussed other treatment options.  Discussed SGLT2 or GLP1.  She declines.  Wants to work on diet and exercise.

## 2019-02-14 NOTE — Assessment & Plan Note (Signed)
Increased stress.  Discussed with her today.  She feels she is handling things relatively well.  Seeing psychiatry.

## 2019-02-14 NOTE — Assessment & Plan Note (Signed)
Follow blood pressure.  Continue current medication regimen.  Follow metabolic panel.

## 2019-02-14 NOTE — Assessment & Plan Note (Signed)
Discussed diet and exercise.  Follow.  

## 2019-02-14 NOTE — Assessment & Plan Note (Signed)
On pravastatin.  Low cholesterol diet and exercise.  Follow lipid panel and liver function tests.   

## 2019-02-19 LAB — CALPROTECTIN, FECAL: Calprotectin, Fecal: 57 ug/g (ref 0–120)

## 2019-02-22 ENCOUNTER — Other Ambulatory Visit: Payer: Self-pay | Admitting: Internal Medicine

## 2019-02-24 ENCOUNTER — Other Ambulatory Visit: Payer: Self-pay | Admitting: Internal Medicine

## 2019-02-24 MED ORDER — METFORMIN HCL 500 MG PO TABS: ORAL_TABLET | ORAL | 0 refills | Status: DC

## 2019-02-24 MED ORDER — LOSARTAN POTASSIUM 100 MG PO TABS: ORAL_TABLET | ORAL | 0 refills | Status: DC

## 2019-03-15 ENCOUNTER — Other Ambulatory Visit: Payer: Self-pay | Admitting: Internal Medicine

## 2019-03-23 ENCOUNTER — Other Ambulatory Visit: Payer: Self-pay | Admitting: Internal Medicine

## 2019-04-10 ENCOUNTER — Telehealth: Payer: Self-pay | Admitting: Psychiatry

## 2019-04-10 NOTE — Telephone Encounter (Signed)
Received FMLA for completion. Patient has not been seen in office since march 2020. For completion of FMLA ,patient needs to be seen regularly. Sent message to Lea at front desk to call and advice patient.

## 2019-04-14 ENCOUNTER — Other Ambulatory Visit: Payer: Self-pay

## 2019-04-14 ENCOUNTER — Ambulatory Visit (INDEPENDENT_AMBULATORY_CARE_PROVIDER_SITE_OTHER): Payer: BC Managed Care – PPO | Admitting: Psychiatry

## 2019-04-14 ENCOUNTER — Encounter: Payer: Self-pay | Admitting: Psychiatry

## 2019-04-14 DIAGNOSIS — F330 Major depressive disorder, recurrent, mild: Secondary | ICD-10-CM | POA: Diagnosis not present

## 2019-04-14 DIAGNOSIS — F411 Generalized anxiety disorder: Secondary | ICD-10-CM | POA: Insufficient documentation

## 2019-04-14 NOTE — Progress Notes (Signed)
Virtual Visit via Video Note  I connected with Lindsey Strickland on 04/14/19 at  3:00 PM EDT by a video enabled telemedicine application and verified that I am speaking with the correct person using two identifiers.   I discussed the limitations of evaluation and management by telemedicine and the availability of in person appointments. The patient expressed understanding and agreed to proceed.   I discussed the assessment and treatment plan with the patient. The patient was provided an opportunity to ask questions and all were answered. The patient agreed with the plan and demonstrated an understanding of the instructions.   The patient was advised to call back or seek an in-person evaluation if the symptoms worsen or if the condition fails to improve as anticipated.  Pleasant Dale MD OP Progress Note  04/14/2019 4:49 PM Lindsey Strickland  MRN:  [id number]  Chief Complaint :  Chief Complaint    Follow-up     HPI: Lindsey Strickland is a 43 year old Caucasian female, has a history of depression, anxiety, diabetes mellitus, hypertension, hyperlipidemia, history of endometrial hyperplasia currently being treated with IUD, PMS was evaluated by telemedicine today.   Patient was last seen on 10/09/2018. Prior to that visit , patient was started on Viibryd.  She had not yet started taking the medication at that time.  She however was advised to return to clinic in 2 weeks.  Patient today returns with an FMLA form to be filled out.  Patient reports she has not started taking the Viibryd yet.  When this was discussed, patient reported that she has a fear of medication and hence has not started taking it.  She however reports she has been in psychotherapy sessions every week with her therapist.  Patient reports that she wants her FMLA form filled out so that she can continue psychotherapy.  Discussed with patient that since writer is not managing her medications and she has a fear of medications and does not want to be on  any medications at this time, would recommend just continuing psychotherapy sessions at this time.  Discussed with her to contact her therapist or even her primary medical doctor to complete her FMLA and that writer will not be able to complete it for her at this time since she is not even on medication management and she does not need follow-ups in this clinic at this time since she has a fear of medications.  She would benefit from continuing psychotherapy sessions with her therapist and  advised to have that discussion with her.  Patient however got agitated and angry and reported that since writer is not willing to complete her FMLA she does not need writer's  services anymore and she disconnected the video call before the session could be completed.  Visit Diagnosis:    ICD-10-CM   1. GAD (generalized anxiety disorder)  F41.1   2. MDD (major depressive disorder), recurrent episode, mild ([place])  F33.0     Past Psychiatric History: I have reviewed past psychiatric history from my progress note on 08/05/2018.  Past trials of Celexa, Zoloft  Past Medical History:  Past Medical History:  Diagnosis Date  . Atypical endometrial hyperplasia   . Diabetes mellitus ([place])   . Diarrhea   . Environmental allergies   . Generalized anxiety disorder   . GERD (gastroesophageal reflux disease)   . Hypercholesterolemia   . Hypertension   . Insomnia   . Major depressive disorder     Past Surgical History:  Procedure Laterality Date  .  COLONOSCOPY    . COLONOSCOPY WITH PROPOFOL N/A 05/28/2018   Procedure: COLONOSCOPY WITH PROPOFOL;  Surgeon: Manya Silvas, MD;  Location: [facility] ENDOSCOPY;  Service: Endoscopy;  Laterality: N/A;  . ESOPHAGOGASTRODUODENOSCOPY (EGD) WITH PROPOFOL N/A 05/28/2018   Procedure: ESOPHAGOGASTRODUODENOSCOPY (EGD) WITH PROPOFOL;  Surgeon: Manya Silvas, MD;  Location: [facility] ENDOSCOPY;  Service: Endoscopy;  Laterality: N/A;  . HYSTEROSCOPY W/D&C     and polyp removal     Family Psychiatric History: I have reviewed family psychiatric history from my progress note on 08/05/2018  Family History:  Family History  Problem Relation Age of Onset  . Hypertension Mother   . Diabetes Mother   . Diverticulosis Mother   . Depression Mother   . Hypercholesterolemia Father   . Hypertension Father   . Cancer Father   . Heart disease Maternal Grandfather   . Diverticulosis Paternal Grandmother   . Hyperlipidemia Paternal Grandmother   . Heart failure Paternal Grandmother   . Cancer Maternal Grandmother   . Alcoholism Paternal Uncle     Social History: I have reviewed social history from my progress note on 08/05/2018 Social History   Socioeconomic History  . Marital status: Single    Spouse name: Not on file  . Number of children: 2  . Years of education: Not on file  . Highest education level: High school graduate  Occupational History    Employer: [employer]  Social Needs  . Financial resource strain: Not hard at all  . Food insecurity    Worry: Never true    Inability: Never true  . Transportation needs    Medical: No    Non-medical: No  Tobacco Use  . Smoking status: Former Smoker    Quit date: 08/06/2000    Years since quitting: 18.6  . Smokeless tobacco: Never Used  Substance and Sexual Activity  . Alcohol use: Yes    Alcohol/week: 0.0 standard drinks    Comment: rare  . Drug use: No  . Sexual activity: Yes    Birth control/protection: I.U.D.  Lifestyle  . Physical activity    Days per week: Not on file    Minutes per session: Not on file  . Stress: Not on file  Relationships  . Social Herbalist on phone: Not on file    Gets together: Not on file    Attends religious service: 1 to 4 times per year    Active member of club or organization: No    Attends meetings of clubs or organizations: Never    Relationship status: Living with partner  Other Topics Concern  . Not on file  Social History Narrative  . Not  on file    Allergies:  Allergies  Allergen Reactions  . Tetracycline Other (See Comments)    Other reaction(s): Unknown   . Augmentin [Amoxicillin-Pot Clavulanate]     GI intolerance   . Hctz [Hydrochlorothiazide]   . Lisinopril     Other reaction(s): Other (See Comments) cough  . Other     Mycins  . Prednisone     Chest pain  . Ciprofloxacin Nausea Only and Palpitations    Metabolic Disorder Labs: Lab Results  Component Value Date   HGBA1C 7.8 (H) 02/09/2019   No results found for: PROLACTIN Lab Results  Component Value Date   CHOL 173 02/09/2019   TRIG 220.0 (H) 02/09/2019   HDL 37.10 (L) 02/09/2019   CHOLHDL 5 02/09/2019   VLDL 44.0 (H) 02/09/2019  [place] 114 (H) 12/15/2015   LDLCALC 89 08/18/2014   Lab Results  Component Value Date   TSH 2.89 04/15/2018   TSH 3.23 04/02/2016    Therapeutic Level Labs: No results found for: LITHIUM No results found for: VALPROATE No components found for:  CBMZ  Current Medications: Current Outpatient Medications  Medication Sig Dispense Refill  . albuterol (PROVENTIL HFA;VENTOLIN HFA) 108 (90 Base) MCG/ACT inhaler Inhale 2 puffs into the lungs every 6 (six) hours as needed for wheezing. 1 Inhaler 1  . amLODipine (NORVASC) 10 MG tablet TAKE ONE-HALF TABLET BY MOUTH TWICE DAILY 90 tablet 1  . famotidine (PEPCID) 20 MG tablet TAKE 2 TABLETS (40 MG) BY MOUTH NIGHTLY    . ferrous sulfate 325 (65 FE) MG tablet Take 325 mg by mouth daily with breakfast.    . fluticasone (FLONASE) 50 MCG/ACT nasal spray Place 2 sprays into both nostrils daily. 16 g 3  . glucose blood test strip 1 each by Other route 2 (two) times daily. Use as instructed, use one strip twice a day    . hydrOXYzine (ATARAX/VISTARIL) 25 MG tablet Take 0.5-1 tablets (12.5-25 mg total) by mouth 2 (two) times daily as needed (severe anxiety sx). 30 tablet 0  . iron polysaccharides (NU-IRON) 150 MG capsule Take 1 capsule (150 mg total) by mouth daily. 30 capsule 2   . Lancets MISC by Does not apply route. Use 1 lancets three times a day    . loratadine (ALAVERT) 10 MG tablet Take 10 mg by mouth daily.    Marland Kitchen losartan (COZAAR) 100 MG tablet Take 1 tablet by mouth daily. 90 tablet 0  . metFORMIN (GLUCOPHAGE) 500 MG tablet Take 2 tablets by mouth twice daily 120 tablet 0  . metoprolol succinate (TOPROL-XL) 100 MG 24 hr tablet TAKE 1/2 (ONE-HALF) TABLET BY MOUTH TWICE DAILY 90 tablet 3  . neomycin-polymyxin b-dexamethasone (MAXITROL) 3.5-10000-0.1 SUSP Place 2 drops into the right eye every 6 (six) hours. 5 mL 0  . omeprazole (PRILOSEC OTC) 20 MG tablet Take 20 mg by mouth daily.    . pravastatin (PRAVACHOL) 40 MG tablet TAKE 1 TABLET BY MOUTH ONCE DAILY. MUST KEEP NEXT APPOINTMENT. 90 tablet 1  . Vilazodone HCl (VIIBRYD) 10 MG TABS Take 1 tablet (10 mg total) by mouth daily. 30 tablet 0   No current facility-administered medications for this visit.      Musculoskeletal: Strength & Muscle Tone: UTA Gait & Station: Observed as seated Patient leans: N/A  Psychiatric Specialty Exam: Review of Systems  Unable to perform ROS: Other (Patient left session before it could be completed)    There were no vitals taken for this visit.There is no height or weight on file to calculate BMI.  General Appearance: Casual  Eye Contact:  Fair  Speech:  Normal Rate  Volume:  Increased  Mood:  Irritable  Affect:  Labile  Thought Process:  Goal Directed and Descriptions of Associations: Intact  Orientation:  Other:  to person , place, situation  Thought Content: Logical   Suicidal Thoughts:  unable to assess- since patient left session before it could be completed  Homicidal Thoughts:  UTA  Memory:  Immediate;   Fair Recent;   Fair Remote;   Fair  Judgement:  Impaired  Insight:  Shallow  Psychomotor Activity:  Increased  Concentration:  Concentration: Fair and Attention Span: Fair  Recall:  [employer] of Knowledge: Fair  Language: Fair  Akathisia:  No   Handed:  Right  AIMS (if indicated): UTA  Assets:  Communication Skills  ADL's:  Intact  Cognition: WNL  Sleep:  UTA   Screenings: PHQ2-9     Office Visit from 02/09/2019 in [place] Office Visit from 08/24/2013 in [place]  PHQ-2 Total Score  3  0  PHQ-9 Total Score  11  -       Assessment and Plan: Kyriana is a 43 year old Caucasian female, employed, Ship broker at [employer], lives with her partner in [place] , has a history of depression, anxiety, hypertension, hyperlipidemia, endometrial hyperplasia, currently on IUD was evaluated by telemedicine today.    Patient however during the session became upset and angry when writer told her that her FMLA could not be completed since writer is not managing her medications at this time.  During her last visit she was advised to return in 2 weeks.  She however returns today after 6 months reporting that she needs FMLA completed so that she can have psychotherapy sessions with her therapist in the community.  Advised her to follow-up with her therapist at this time since she has a fear of medications and since she does not want to be on medications.  Discussed with her that she should talk to her therapist or her primary care provider about her FMLA.  Patient however reported she did not want writer's services and hung up before the session could be completed.  I have spent atleast 10 minutes non  face to face with patient today. More than 50 % of the time was spent for psychoeducation and supportive psychotherapy and care coordination.  This note was generated in part or whole with voice recognition software. Voice recognition is usually quite accurate but there are transcription errors that can and very often do occur. I apologize for any typographical errors that were not detected and corrected.           Ursula Alert, MD 04/14/2019, 4:49 PM

## 2019-04-20 ENCOUNTER — Other Ambulatory Visit: Payer: Self-pay | Admitting: Internal Medicine

## 2019-04-22 ENCOUNTER — Other Ambulatory Visit: Payer: Self-pay | Admitting: Internal Medicine

## 2019-04-22 MED ORDER — METFORMIN HCL 500 MG PO TABS: ORAL_TABLET | ORAL | 1 refills | Status: DC

## 2019-06-01 ENCOUNTER — Other Ambulatory Visit: Payer: Self-pay | Admitting: Internal Medicine

## 2019-06-22 ENCOUNTER — Other Ambulatory Visit: Payer: BC Managed Care – PPO

## 2019-06-23 ENCOUNTER — Other Ambulatory Visit: Payer: BC Managed Care – PPO

## 2019-06-23 ENCOUNTER — Other Ambulatory Visit: Payer: Self-pay

## 2019-06-23 ENCOUNTER — Ambulatory Visit (INDEPENDENT_AMBULATORY_CARE_PROVIDER_SITE_OTHER): Payer: BC Managed Care – PPO | Admitting: Internal Medicine

## 2019-06-23 ENCOUNTER — Encounter: Payer: Self-pay | Admitting: Internal Medicine

## 2019-06-23 DIAGNOSIS — Z1231 Encounter for screening mammogram for malignant neoplasm of breast: Secondary | ICD-10-CM

## 2019-06-23 DIAGNOSIS — D473 Essential (hemorrhagic) thrombocythemia: Secondary | ICD-10-CM

## 2019-06-23 DIAGNOSIS — E1165 Type 2 diabetes mellitus with hyperglycemia: Secondary | ICD-10-CM | POA: Diagnosis not present

## 2019-06-23 DIAGNOSIS — E7800 Pure hypercholesterolemia, unspecified: Secondary | ICD-10-CM

## 2019-06-23 DIAGNOSIS — K219 Gastro-esophageal reflux disease without esophagitis: Secondary | ICD-10-CM | POA: Diagnosis not present

## 2019-06-23 DIAGNOSIS — D75839 Thrombocytosis, unspecified: Secondary | ICD-10-CM

## 2019-06-23 DIAGNOSIS — R202 Paresthesia of skin: Secondary | ICD-10-CM

## 2019-06-23 DIAGNOSIS — F439 Reaction to severe stress, unspecified: Secondary | ICD-10-CM

## 2019-06-23 DIAGNOSIS — I10 Essential (primary) hypertension: Secondary | ICD-10-CM

## 2019-06-23 DIAGNOSIS — D509 Iron deficiency anemia, unspecified: Secondary | ICD-10-CM

## 2019-06-23 NOTE — Progress Notes (Signed)
Patient ID: Lindsey Strickland, female   DOB: 22-Sep-1975, 42 y.o.   MRN: [id number]   Virtual Visit via video Note  This visit type was conducted due to national recommendations for restrictions regarding the COVID-19 pandemic (e.g. social distancing).  This format is felt to be most appropriate for this patient at this time.  All issues noted in this document were discussed and addressed.  No physical exam was performed (except for noted visual exam findings with Video Visits).   I connected with Lindsey Strickland by a video enabled telemedicine application and verified that I am speaking with the correct person using two identifiers. Location patient: home Location provider: work or home office Persons participating in the virtual visit: patient, provider  I discussed the limitations, risks, security and privacy concerns of performing an evaluation and management service by video and the availability of in person appointments.  The patient expressed understanding and agreed to proceed.   Reason for visit: scheduled follow up.   HPI: She reports she is doing relatively well.  Working from home.  Kids are home.  Some increased stress related to this.  She is seeing a Social worker on a regular basis.  No longer seeing psychiatry.  Did not feel she needed the medication.  She is not exercising regularly.  No checking her sugars and not watching her diet as well as she should.  Discussed low carb diet and exercise.  No chest pain.  Breathing stable.  Still having issues with her bowels.  Seeing GI.  Has had EGD and colonoscopy.  Desires no further intervention at this time.  Has noticed her head tingling.  States go into face.  Will alternate sides.  Some neck discomfort.  Has noticed some numbness in her left pinky.  No weakness in her arm or hand.  No headache.  No dizziness.  Discussed further w/up.  She declines.  Discussed the need for labs and mammogram.     ROS: See pertinent positives and negatives  per HPI.  Past Medical History:  Diagnosis Date  . Atypical endometrial hyperplasia   . Diabetes mellitus ([place])   . Diarrhea   . Environmental allergies   . Generalized anxiety disorder   . GERD (gastroesophageal reflux disease)   . Hypercholesterolemia   . Hypertension   . Insomnia   . Major depressive disorder     Past Surgical History:  Procedure Laterality Date  . COLONOSCOPY    . COLONOSCOPY WITH PROPOFOL N/A 05/28/2018   Procedure: COLONOSCOPY WITH PROPOFOL;  Surgeon: Manya Silvas, MD;  Location: [facility] ENDOSCOPY;  Service: Endoscopy;  Laterality: N/A;  . ESOPHAGOGASTRODUODENOSCOPY (EGD) WITH PROPOFOL N/A 05/28/2018   Procedure: ESOPHAGOGASTRODUODENOSCOPY (EGD) WITH PROPOFOL;  Surgeon: Manya Silvas, MD;  Location: [facility] ENDOSCOPY;  Service: Endoscopy;  Laterality: N/A;  . HYSTEROSCOPY W/D&C     and polyp removal    Family History  Problem Relation Age of Onset  . Hypertension Mother   . Diabetes Mother   . Diverticulosis Mother   . Depression Mother   . Hypercholesterolemia Father   . Hypertension Father   . Cancer Father   . Heart disease Maternal Grandfather   . Diverticulosis Paternal Grandmother   . Hyperlipidemia Paternal Grandmother   . Heart failure Paternal Grandmother   . Cancer Maternal Grandmother   . Alcoholism Paternal Uncle     SOCIAL HX: reviewed.    Current Outpatient Medications:  .  albuterol (PROVENTIL HFA;VENTOLIN HFA) 108 (90 Base) MCG/ACT inhaler, Inhale  2 puffs into the lungs every 6 (six) hours as needed for wheezing., Disp: 1 Inhaler, Rfl: 1 .  amLODipine (NORVASC) 10 MG tablet, TAKE ONE-HALF TABLET BY MOUTH TWICE DAILY, Disp: 90 tablet, Rfl: 1 .  famotidine (PEPCID) 20 MG tablet, TAKE 2 TABLETS (40 MG) BY MOUTH NIGHTLY, Disp: , Rfl:  .  ferrous sulfate 325 (65 FE) MG tablet, Take 325 mg by mouth daily with breakfast., Disp: , Rfl:  .  fluticasone (FLONASE) 50 MCG/ACT nasal spray, Place 2 sprays into both nostrils daily., Disp:  16 g, Rfl: 3 .  glucose blood test strip, 1 each by Other route 2 (two) times daily. Use as instructed, use one strip twice a day, Disp: , Rfl:  .  hydrOXYzine (ATARAX/VISTARIL) 25 MG tablet, Take 0.5-1 tablets (12.5-25 mg total) by mouth 2 (two) times daily as needed (severe anxiety sx)., Disp: 30 tablet, Rfl: 0 .  iron polysaccharides (NU-IRON) 150 MG capsule, Take 1 capsule (150 mg total) by mouth daily., Disp: 30 capsule, Rfl: 2 .  Lancets MISC, by Does not apply route. Use 1 lancets three times a day, Disp: , Rfl:  .  loratadine (ALAVERT) 10 MG tablet, Take 10 mg by mouth daily., Disp: , Rfl:  .  losartan (COZAAR) 100 MG tablet, Take 1 tablet by mouth once daily, Disp: 90 tablet, Rfl: 0 .  metFORMIN (GLUCOPHAGE) 500 MG tablet, Take 2 tablets by mouth twice daily, Disp: 180 tablet, Rfl: 1 .  metoprolol succinate (TOPROL-XL) 100 MG 24 hr tablet, TAKE 1/2 (ONE-HALF) TABLET BY MOUTH TWICE DAILY, Disp: 90 tablet, Rfl: 3 .  neomycin-polymyxin b-dexamethasone (MAXITROL) 3.5-10000-0.1 SUSP, Place 2 drops into the right eye every 6 (six) hours., Disp: 5 mL, Rfl: 0 .  omeprazole (PRILOSEC OTC) 20 MG tablet, Take 20 mg by mouth daily., Disp: , Rfl:  .  pravastatin (PRAVACHOL) 40 MG tablet, TAKE 1 TABLET BY MOUTH ONCE DAILY. MUST KEEP NEXT APPOINTMENT., Disp: 90 tablet, Rfl: 1 .  Vilazodone HCl (VIIBRYD) 10 MG TABS, Take 1 tablet (10 mg total) by mouth daily., Disp: 30 tablet, Rfl: 0  EXAM:  GENERAL: alert, oriented, appears well and in no acute distress  HEENT: atraumatic, conjunttiva clear, no obvious abnormalities on inspection of external nose and ears  NECK: normal movements of the head and neck  LUNGS: on inspection no signs of respiratory distress, breathing rate appears normal, no obvious gross SOB, gasping or wheezing  CV: no obvious cyanosis  PSYCH/NEURO: pleasant and cooperative, no obvious depression or anxiety, speech and thought processing grossly intact  ASSESSMENT AND PLAN:   Discussed the following assessment and plan:  Diabetes mellitus On metformin.  Discussed changing medication given her bowel issues, just to see if improves. She declines.  Discussed the need to check sugars regularly.  Discussed low carb diet and exercise.  Follow met b and a1c.   GERD (gastroesophageal reflux disease) No upper symptoms reported.    Hypercholesterolemia On pravastatin.  Low cholesterol diet and exercise.  Follow lipid panel and liver function tests.    Hypertension Not checking blood pressures.  Discussed the need to follow pressures.  Continue current medication regimen.  Follow pressures.  Follow metabolic panel.   Iron deficiency anemia Has seen Dr Mike Gip. Follow CBC.   Thrombocytosis ([place]) Evaluated by hematology.  Felt to be reactive.  Follow cbc.   Stress Increased stress.  Discussed with her today. Seeing a Social worker.  Feels she is handling things relatively well.  Does not feel  needs any further intervention.  Follow.    Tingling Describes varied tingling in her head and some numbness in her left pinky.  Some neck pain.  Discussed possible etiologies.  Discussed underlying neck issues, etc.  Discussed further w/up and scanning. She declines.  Wants to monitor.      I discussed the assessment and treatment plan with the patient. The patient was provided an opportunity to ask questions and all were answered. The patient agreed with the plan and demonstrated an understanding of the instructions.   The patient was advised to call back or seek an in-person evaluation if the symptoms worsen or if the condition fails to improve as anticipated.   Einar Pheasant, MD

## 2019-06-28 ENCOUNTER — Encounter: Payer: Self-pay | Admitting: Internal Medicine

## 2019-06-28 DIAGNOSIS — R202 Paresthesia of skin: Secondary | ICD-10-CM | POA: Insufficient documentation

## 2019-06-28 NOTE — Assessment & Plan Note (Signed)
Increased stress.  Discussed with her today. Seeing a Social worker.  Feels she is handling things relatively well.  Does not feel needs any further intervention.  Follow.

## 2019-06-28 NOTE — Assessment & Plan Note (Signed)
Not checking blood pressures.  Discussed the need to follow pressures.  Continue current medication regimen.  Follow pressures.  Follow metabolic panel.

## 2019-06-28 NOTE — Assessment & Plan Note (Signed)
Has seen Dr Mike Gip. Follow CBC.

## 2019-06-28 NOTE — Assessment & Plan Note (Signed)
On metformin.  Discussed changing medication given her bowel issues, just to see if improves. She declines.  Discussed the need to check sugars regularly.  Discussed low carb diet and exercise.  Follow met b and a1c.  

## 2019-06-28 NOTE — Assessment & Plan Note (Signed)
Evaluated by hematology.  Felt to be reactive.  Follow cbc.  

## 2019-06-28 NOTE — Assessment & Plan Note (Signed)
No upper symptoms reported.   

## 2019-06-28 NOTE — Assessment & Plan Note (Signed)
On pravastatin.  Low cholesterol diet and exercise.  Follow lipid panel and liver function tests.   

## 2019-06-28 NOTE — Assessment & Plan Note (Signed)
Describes varied tingling in her head and some numbness in her left pinky.  Some neck pain.  Discussed possible etiologies.  Discussed underlying neck issues, etc.  Discussed further w/up and scanning. She declines.  Wants to monitor.

## 2019-07-20 ENCOUNTER — Other Ambulatory Visit: Payer: Self-pay | Admitting: Internal Medicine

## 2019-08-03 ENCOUNTER — Other Ambulatory Visit: Payer: Self-pay | Admitting: Internal Medicine

## 2019-08-24 ENCOUNTER — Other Ambulatory Visit: Payer: Self-pay | Admitting: Internal Medicine

## 2019-09-03 ENCOUNTER — Other Ambulatory Visit: Payer: Self-pay | Admitting: Internal Medicine

## 2019-09-14 ENCOUNTER — Other Ambulatory Visit: Payer: Self-pay | Admitting: Internal Medicine

## 2019-10-19 ENCOUNTER — Other Ambulatory Visit: Payer: Self-pay | Admitting: Internal Medicine

## 2019-10-25 ENCOUNTER — Other Ambulatory Visit: Payer: Self-pay | Admitting: Internal Medicine

## 2019-11-16 ENCOUNTER — Encounter: Payer: Self-pay | Admitting: Internal Medicine

## 2019-11-16 NOTE — Telephone Encounter (Signed)
I have not seen her since 06/2019.  She is overdue appt.  Please schedule and I can talk with her about vaccines at appt.

## 2019-11-17 ENCOUNTER — Encounter: Payer: Self-pay | Admitting: Internal Medicine

## 2019-11-17 NOTE — Telephone Encounter (Signed)
Pt has appt on 4/26 and would like to discuss then.

## 2019-11-17 NOTE — Telephone Encounter (Signed)
Called and scheduled pt

## 2019-11-18 ENCOUNTER — Other Ambulatory Visit: Payer: BC Managed Care – PPO

## 2019-11-20 ENCOUNTER — Ambulatory Visit: Payer: BC Managed Care – PPO | Admitting: Internal Medicine

## 2019-11-22 ENCOUNTER — Other Ambulatory Visit: Payer: Self-pay | Admitting: Internal Medicine

## 2019-11-25 ENCOUNTER — Other Ambulatory Visit: Payer: Self-pay

## 2019-11-25 ENCOUNTER — Ambulatory Visit (INDEPENDENT_AMBULATORY_CARE_PROVIDER_SITE_OTHER): Payer: BC Managed Care – PPO | Admitting: Internal Medicine

## 2019-11-25 ENCOUNTER — Encounter: Payer: Self-pay | Admitting: Internal Medicine

## 2019-11-25 VITALS — BP 134/78 | HR 95 | Temp 98.4°F | Resp 16 | Ht 68.0 in | Wt 308.0 lb

## 2019-11-25 DIAGNOSIS — D75839 Thrombocytosis, unspecified: Secondary | ICD-10-CM

## 2019-11-25 DIAGNOSIS — I10 Essential (primary) hypertension: Secondary | ICD-10-CM

## 2019-11-25 DIAGNOSIS — D509 Iron deficiency anemia, unspecified: Secondary | ICD-10-CM

## 2019-11-25 DIAGNOSIS — K219 Gastro-esophageal reflux disease without esophagitis: Secondary | ICD-10-CM

## 2019-11-25 DIAGNOSIS — E7800 Pure hypercholesterolemia, unspecified: Secondary | ICD-10-CM | POA: Diagnosis not present

## 2019-11-25 DIAGNOSIS — E1165 Type 2 diabetes mellitus with hyperglycemia: Secondary | ICD-10-CM | POA: Diagnosis not present

## 2019-11-25 DIAGNOSIS — R0683 Snoring: Secondary | ICD-10-CM

## 2019-11-25 DIAGNOSIS — D473 Essential (hemorrhagic) thrombocythemia: Secondary | ICD-10-CM

## 2019-11-25 DIAGNOSIS — M79622 Pain in left upper arm: Secondary | ICD-10-CM

## 2019-11-25 DIAGNOSIS — N920 Excessive and frequent menstruation with regular cycle: Secondary | ICD-10-CM

## 2019-11-25 LAB — BASIC METABOLIC PANEL
BUN: 14 mg/dL (ref 6–23)
CO2: 26 mEq/L (ref 19–32)
Calcium: 9.6 mg/dL (ref 8.4–10.5)
Chloride: 98 mEq/L (ref 96–112)
Creatinine, Ser: 0.49 mg/dL (ref 0.40–1.20)
GFR: 138.95 mL/min (ref >60.00)
Glucose, Bld: 127 mg/dL — ABNORMAL HIGH (ref 70–99)
Potassium: 4.3 mEq/L (ref 3.5–5.1)
Sodium: 136 mEq/L (ref 135–145)

## 2019-11-25 LAB — HEPATIC FUNCTION PANEL
ALT: 22 U/L (ref 0–35)
AST: 21 U/L (ref 0–37)
Albumin: 4.3 g/dL (ref 3.5–5.2)
Alkaline Phosphatase: 74 U/L (ref 39–117)
Bilirubin, Direct: 0.1 mg/dL (ref 0.0–0.3)
Total Bilirubin: 0.3 mg/dL (ref 0.2–1.2)
Total Protein: 7.5 g/dL (ref 6.0–8.3)

## 2019-11-25 LAB — LIPID PANEL
Cholesterol: 205 mg/dL — ABNORMAL HIGH (ref 0–200)
HDL: 40.8 mg/dL (ref >39.00)
NonHDL: 164.38
Total CHOL/HDL Ratio: 5
Triglycerides: 304 mg/dL — ABNORMAL HIGH (ref 0.0–149.0)
VLDL: 60.8 mg/dL — ABNORMAL HIGH (ref 0.0–40.0)

## 2019-11-25 LAB — TSH: TSH: 3.56 u[IU]/mL (ref 0.35–4.50)

## 2019-11-25 LAB — IBC + FERRITIN
Ferritin: 17.2 ng/mL (ref 10.0–291.0)
Iron: 50 ug/dL (ref 42–145)
Saturation Ratios: 9.1 % — ABNORMAL LOW (ref 20.0–50.0)
Transferrin: 393 mg/dL — ABNORMAL HIGH (ref 212.0–360.0)

## 2019-11-25 LAB — LDL CHOLESTEROL, DIRECT: Direct LDL: 124 mg/dL

## 2019-11-25 LAB — HEMOGLOBIN A1C: Hgb A1c MFr Bld: 7.7 % — ABNORMAL HIGH (ref 4.6–6.5)

## 2019-11-25 NOTE — Progress Notes (Signed)
Patient ID: Lindsey Strickland, female   DOB: 1975/12/03, 44 y.o.   MRN: [id number]   Subjective:    Patient ID: Lindsey Strickland, female    DOB: 11/10/1975, 44 y.o.   MRN: [id number]  HPI This visit occurred during the SARS-CoV-2 public health emergency.  Safety protocols were in place, including screening questions prior to the visit, additional usage of staff PPE, and extensive cleaning of exam room while observing appropriate contact time as indicated for disinfecting solutions.  Patient here for a scheduled follow up. She reports varied vaginal bleeding - light/heavy - alternating.  Noticed Friday after Easter - blood in toilet.  BRB with wiping and in water.  Felt was vaginal bleeding.  Bled through weekend.  Some pink discharge.  Has seen gyn - Dr Ihor Dow previously.  Plans to f/u with her.  She will make appt.  Noticed some blurring in her eye.  States was told had inflamed cornea. Treated with tobramycin drops.  Followed by Dr Thomasene Ripple.  States eyelids not close when sleep - dry eyes.  Using refresh drops.  Taking metformin.  She is eating better.  Has decreased carbs and pasta.  Discussed sugar and diabetes treatment.  Desires not to take other medication.  Discussed diet and exercise.  No chest pain or sob reported.   Some diarrhea - better with certain foods.  Left arm axilla - pain.  Move - burns/hurts.  Some fatigue.  Snores.  Wake up snoring.  Discussed possible sleep apnea.  Agreeable for referral for evaluation for sleep apnea.      Past Medical History:  Diagnosis Date  . Atypical endometrial hyperplasia   . Diabetes mellitus ([place])   . Diarrhea   . Environmental allergies   . Generalized anxiety disorder   . GERD (gastroesophageal reflux disease)   . Hypercholesterolemia   . Hypertension   . Insomnia   . Major depressive disorder    Past Surgical History:  Procedure Laterality Date  . COLONOSCOPY    . COLONOSCOPY WITH PROPOFOL N/A 05/28/2018   Procedure: COLONOSCOPY  WITH PROPOFOL;  Surgeon: Manya Silvas, MD;  Location: [facility] ENDOSCOPY;  Service: Endoscopy;  Laterality: N/A;  . ESOPHAGOGASTRODUODENOSCOPY (EGD) WITH PROPOFOL N/A 05/28/2018   Procedure: ESOPHAGOGASTRODUODENOSCOPY (EGD) WITH PROPOFOL;  Surgeon: Manya Silvas, MD;  Location: [facility] ENDOSCOPY;  Service: Endoscopy;  Laterality: N/A;  . HYSTEROSCOPY WITH D & C     and polyp removal   Family History  Problem Relation Age of Onset  . Hypertension Mother   . Diabetes Mother   . Diverticulosis Mother   . Depression Mother   . Hypercholesterolemia Father   . Hypertension Father   . Cancer Father   . Heart disease Maternal Grandfather   . Diverticulosis Paternal Grandmother   . Hyperlipidemia Paternal Grandmother   . Heart failure Paternal Grandmother   . Cancer Maternal Grandmother   . Alcoholism Paternal Uncle    Social History   Socioeconomic History  . Marital status: Single    Spouse name: Not on file  . Number of children: 2  . Years of education: Not on file  . Highest education level: High school graduate  Occupational History    Employer: [employer]  Tobacco Use  . Smoking status: Former Smoker    Quit date: 08/06/2000    Years since quitting: 19.3  . Smokeless tobacco: Never Used  Substance and Sexual Activity  . Alcohol use: Yes    Alcohol/week: 0.0 standard  drinks    Comment: rare  . Drug use: No  . Sexual activity: Yes    Birth control/protection: I.U.D.  Other Topics Concern  . Not on file  Social History Narrative  . Not on file   Social Determinants of Health   Financial Resource Strain:   . Difficulty of Paying Living Expenses:   Food Insecurity:   . Worried About Charity fundraiser in the Last Year:   . Arboriculturist in the Last Year:   Transportation Needs:   . Film/video editor (Medical):   Marland Kitchen Lack of Transportation (Non-Medical):   Physical Activity:   . Days of Exercise per Week:   . Minutes of Exercise per Session:   Stress:    . Feeling of Stress :   Social Connections:   . Frequency of Communication with Friends and Family:   . Frequency of Social Gatherings with Friends and Family:   . Attends Religious Services:   . Active Member of Clubs or Organizations:   . Attends Archivist Meetings:   Marland Kitchen Marital Status:     Outpatient Encounter Medications as of 11/25/2019  Medication Sig  . albuterol (PROVENTIL HFA;VENTOLIN HFA) 108 (90 Base) MCG/ACT inhaler Inhale 2 puffs into the lungs every 6 (six) hours as needed for wheezing.  Marland Kitchen amLODipine (NORVASC) 10 MG tablet TAKE ONE-HALF TABLET BY MOUTH TWICE DAILY  . famotidine (PEPCID) 20 MG tablet TAKE 2 TABLETS (40 MG) BY MOUTH NIGHTLY  . ferrous sulfate 325 (65 FE) MG tablet Take 325 mg by mouth daily with breakfast.  . fluticasone (FLONASE) 50 MCG/ACT nasal spray Use 2 spray(s) in each nostril once daily  . glucose blood test strip 1 each by Other route 2 (two) times daily. Use as instructed, use one strip twice a day  . iron polysaccharides (NU-IRON) 150 MG capsule Take 1 capsule (150 mg total) by mouth daily.  . Lancets MISC by Does not apply route. Use 1 lancets three times a day  . loratadine (ALAVERT) 10 MG tablet Take 10 mg by mouth daily.  Marland Kitchen losartan (COZAAR) 100 MG tablet Take 1 tablet by mouth once daily  . metFORMIN (GLUCOPHAGE) 500 MG tablet Take 2 tablets by mouth twice daily  . metoprolol succinate (TOPROL-XL) 100 MG 24 hr tablet Take 1/2 (one-half) tablet by mouth twice daily  . neomycin-polymyxin b-dexamethasone (MAXITROL) 3.5-10000-0.1 SUSP Place 2 drops into the right eye every 6 (six) hours.  Marland Kitchen omeprazole (PRILOSEC OTC) 20 MG tablet Take 20 mg by mouth daily.  . pravastatin (PRAVACHOL) 40 MG tablet TAKE 1 TABLET BY MOUTH ONCE DAILY. MUST KEEP NEXT APPOINTMENT.  . Vilazodone HCl (VIIBRYD) 10 MG TABS Take 1 tablet (10 mg total) by mouth daily.  . [DISCONTINUED] hydrOXYzine (ATARAX/VISTARIL) 25 MG tablet Take 0.5-1 tablets (12.5-25 mg total) by  mouth 2 (two) times daily as needed (severe anxiety sx).   No facility-administered encounter medications on file as of 11/25/2019.    Review of Systems  Constitutional: Positive for fatigue. Negative for appetite change and unexpected weight change.  HENT: Negative for congestion and sinus pressure.   Respiratory: Negative for cough, chest tightness and shortness of breath.   Cardiovascular: Negative for chest pain, palpitations and leg swelling.  Gastrointestinal: Positive for diarrhea. Negative for abdominal pain, nausea and vomiting.  Genitourinary: Positive for vaginal bleeding and vaginal discharge. Negative for difficulty urinating and dysuria.  Musculoskeletal: Negative for joint swelling and myalgias.  Skin: Negative for color change  and rash.  Neurological: Negative for dizziness, light-headedness and headaches.  Psychiatric/Behavioral: Negative for agitation and dysphoric mood.       Objective:    Physical Exam Constitutional:      General: She is not in acute distress.    Appearance: Normal appearance.  HENT:     Head: Normocephalic and atraumatic.     Right Ear: External ear normal.     Left Ear: External ear normal.  Eyes:     General: No scleral icterus.       Right eye: No discharge.        Left eye: No discharge.     Conjunctiva/sclera: Conjunctivae normal.  Neck:     Thyroid: No thyromegaly.  Cardiovascular:     Rate and Rhythm: Normal rate and regular rhythm.  Pulmonary:     Effort: No respiratory distress.     Breath sounds: Normal breath sounds. No wheezing.  Abdominal:     General: Bowel sounds are normal.     Palpations: Abdomen is soft.     Tenderness: There is no abdominal tenderness.  Musculoskeletal:        General: No swelling or tenderness.     Cervical back: Neck supple. No tenderness.  Lymphadenopathy:     Cervical: No cervical adenopathy.  Skin:    Findings: No erythema or rash.  Neurological:     Mental Status: She is alert.    Psychiatric:        Mood and Affect: Mood normal.        Behavior: Behavior normal.     BP 134/78   Pulse 95   Temp 98.4 F (36.9 C)   Resp 16   Ht '5\' 8"'  (1.727 m)   Wt (!) 308 lb (139.7 kg)   SpO2 99%   BMI 46.83 kg/m  Wt Readings from Last 3 Encounters:  11/25/19 (!) 308 lb (139.7 kg)  11/23/18 300 lb (136.1 kg)  08/04/18 (!) 304 lb 9.6 oz (138.2 kg)     Lab Results  Component Value Date   WBC 11.4 (H) 11/25/2019   HGB 12.6 11/25/2019   HCT 38.9 11/25/2019   PLT 474.0 (H) 11/25/2019   GLUCOSE 127 (H) 11/25/2019   CHOL 205 (H) 11/25/2019   TRIG 304.0 (H) 11/25/2019   HDL 40.80 11/25/2019   LDLDIRECT 124.0 11/25/2019   LDLCALC 114 (H) 12/15/2015   ALT 22 11/25/2019   AST 21 11/25/2019   NA 136 11/25/2019   K 4.3 11/25/2019   CL 98 11/25/2019   CREATININE 0.49 11/25/2019   BUN 14 11/25/2019   CO2 26 11/25/2019   TSH 3.56 11/25/2019   INR 0.93 05/05/2018   HGBA1C 7.7 (H) 11/25/2019   MICROALBUR 1.9 10/22/2017       Assessment & Plan:   Problem List Items Addressed This Visit    Axillary pain, left    Left axilla - pain - left breast pain.  Overdue mammogram.  Schedule diagnostic mammogram with ultrasound.  Does not want until after May.        Relevant Orders   MM DIAG BREAST TOMO BILATERAL   US BREAST LTD UNI LEFT INC AXILLA   US BREAST LTD UNI RIGHT INC AXILLA   Diabetes mellitus ([place]) - Primary    On metformin.  Not checking sugars.  Discussed low carb diet and exercise.  Follow met b and a1c.  Discussed other treatment options.  She declines.        Relevant Orders  Hemoglobin A1c (Completed)   Basic metabolic panel (Completed)   GERD (gastroesophageal reflux disease)    No upper symptoms reported.       Hypercholesterolemia    On pravastatin.  Low cholesterol diet and exercise.  Follow lipid panel and liver function tests.        Relevant Orders   Hepatic function panel (Completed)   Lipid panel (Completed)   Hypertension    Blood  pressure as outlined.  On no medication.  Follow pressures.  Follow metabolic panel.       Relevant Orders   TSH (Completed)   Iron deficiency anemia    Has seen dr Mike Gip.  Follow cbc.        Relevant Orders   CBC with Differential/Platelet (Completed)   IBC + Ferritin (Completed)   Menorrhagia    Bleeding as outlined.  Plans f/u with gyn - Dr Marshell Levan.        Severe obesity (BMI >= 40) (HCC)    Discussed diet and exercise.  Follow.        Snoring    Snoring as outlined.  Wakes up snoring.  Fatigue.  Concern regarding possible sleep apnea.  Refer to pulmonary for further evaluation .        Relevant Orders   Ambulatory referral to Pulmonology   Thrombocytosis [facility])    Evaluated by hematology.  Felt to be reactive.  Follow cbc.           Einar Pheasant, MD

## 2019-11-26 LAB — CBC WITH DIFFERENTIAL/PLATELET
Basophils Absolute: 0.1 10*3/uL (ref 0.0–0.1)
Basophils Relative: 1.3 % (ref 0.0–3.0)
Eosinophils Absolute: 0.1 10*3/uL (ref 0.0–0.7)
Eosinophils Relative: 1.1 % (ref 0.0–5.0)
HCT: 38.9 % (ref 36.0–46.0)
Hemoglobin: 12.6 g/dL (ref 12.0–15.0)
Lymphocytes Relative: 26.4 % (ref 12.0–46.0)
Lymphs Abs: 3 10*3/uL (ref 0.7–4.0)
MCHC: 32.4 g/dL (ref 30.0–36.0)
MCV: 82.4 fl (ref 78.0–100.0)
Monocytes Absolute: 0.5 10*3/uL (ref 0.1–1.0)
Monocytes Relative: 4.8 % (ref 3.0–12.0)
Neutro Abs: 7.5 10*3/uL (ref 1.4–7.7)
Neutrophils Relative %: 66.4 % (ref 43.0–77.0)
Platelets: 474 10*3/uL — ABNORMAL HIGH (ref 150.0–400.0)
RBC: 4.71 Mil/uL (ref 3.87–5.11)
RDW: 15 % (ref 11.5–15.5)
WBC: 11.4 10*3/uL — ABNORMAL HIGH (ref 4.0–10.5)

## 2019-11-29 DIAGNOSIS — M79622 Pain in left upper arm: Secondary | ICD-10-CM | POA: Insufficient documentation

## 2019-11-29 DIAGNOSIS — R0683 Snoring: Secondary | ICD-10-CM | POA: Insufficient documentation

## 2019-11-29 NOTE — Assessment & Plan Note (Signed)
On pravastatin.  Low cholesterol diet and exercise.  Follow lipid panel and liver function tests.   

## 2019-11-29 NOTE — Assessment & Plan Note (Signed)
Blood pressure as outlined.  On no medication.  Follow pressures.  Follow metabolic panel.  

## 2019-11-29 NOTE — Assessment & Plan Note (Signed)
No upper symptoms reported.   

## 2019-11-29 NOTE — Assessment & Plan Note (Signed)
Discussed diet and exercise.  Follow.  

## 2019-11-29 NOTE — Assessment & Plan Note (Signed)
Evaluated by hematology.  Felt to be reactive.  Follow cbc.  

## 2019-11-29 NOTE — Assessment & Plan Note (Signed)
Bleeding as outlined.  Plans f/u with gyn - Dr Marshell Levan.

## 2019-11-29 NOTE — Assessment & Plan Note (Addendum)
Left axilla - pain - left breast pain.  Overdue mammogram.  Schedule diagnostic mammogram with ultrasound.  Does not want until after May.

## 2019-11-29 NOTE — Assessment & Plan Note (Signed)
On metformin.  Not checking sugars.  Discussed low carb diet and exercise.  Follow met b and a1c.  Discussed other treatment options.  She declines.

## 2019-11-29 NOTE — Assessment & Plan Note (Signed)
Has seen dr Mike Gip.  Follow cbc.

## 2019-11-29 NOTE — Assessment & Plan Note (Signed)
Snoring as outlined.  Wakes up snoring.  Fatigue.  Concern regarding possible sleep apnea.  Refer to pulmonary for further evaluation .

## 2019-11-30 ENCOUNTER — Ambulatory Visit: Payer: BC Managed Care – PPO | Admitting: Internal Medicine

## 2019-12-03 ENCOUNTER — Other Ambulatory Visit: Payer: Self-pay | Admitting: Internal Medicine

## 2019-12-03 DIAGNOSIS — D473 Essential (hemorrhagic) thrombocythemia: Secondary | ICD-10-CM

## 2019-12-03 DIAGNOSIS — D75839 Thrombocytosis, unspecified: Secondary | ICD-10-CM

## 2019-12-03 NOTE — Progress Notes (Signed)
Order placed for f/u cbc.  Liver panel not needed since not changing cholesterol medication.

## 2019-12-04 ENCOUNTER — Ambulatory Visit: Payer: BC Managed Care – PPO | Attending: Internal Medicine

## 2019-12-04 DIAGNOSIS — Z23 Encounter for immunization: Secondary | ICD-10-CM

## 2019-12-04 NOTE — Progress Notes (Signed)
   Covid-19 Vaccination Clinic  Name:  Lindsey Strickland    MRN: [id number] DOB: [birth year]  12/04/2019  [name] was observed post Covid-19 immunization for 30 minutes based on pre-vaccination screening without incident. She was provided with Vaccine Information Sheet and instruction to access the V-Safe system.   [name] was instructed to call 911 with any severe reactions post vaccine: Marland Kitchen Difficulty breathing  . Swelling of face and throat  . A fast heartbeat  . A bad rash all over body  . Dizziness and weakness   Immunizations Administered    Name Date Dose VIS Date Route   Pfizer COVID-19 Vaccine 12/04/2019 10:18 AM 0.3 mL 09/30/2018 Intramuscular   Manufacturer: [place]   Lot: [id number]   [place]: [id number]

## 2019-12-06 ENCOUNTER — Other Ambulatory Visit: Payer: Self-pay | Admitting: Internal Medicine

## 2019-12-08 ENCOUNTER — Other Ambulatory Visit: Payer: Self-pay | Admitting: Internal Medicine

## 2019-12-18 ENCOUNTER — Telehealth: Payer: Self-pay | Admitting: Internal Medicine

## 2019-12-18 NOTE — Telephone Encounter (Signed)
LVM to schedule sleep consult, this is the 3rd attempt to reach patient so will be closing the referral BL  pulmonary

## 2019-12-25 ENCOUNTER — Other Ambulatory Visit: Payer: Self-pay

## 2019-12-25 ENCOUNTER — Other Ambulatory Visit (INDEPENDENT_AMBULATORY_CARE_PROVIDER_SITE_OTHER): Payer: BC Managed Care – PPO

## 2019-12-25 DIAGNOSIS — D75839 Thrombocytosis, unspecified: Secondary | ICD-10-CM

## 2019-12-25 DIAGNOSIS — E7800 Pure hypercholesterolemia, unspecified: Secondary | ICD-10-CM | POA: Diagnosis not present

## 2019-12-25 DIAGNOSIS — E119 Type 2 diabetes mellitus without complications: Secondary | ICD-10-CM | POA: Diagnosis not present

## 2019-12-25 DIAGNOSIS — D473 Essential (hemorrhagic) thrombocythemia: Secondary | ICD-10-CM

## 2019-12-25 DIAGNOSIS — D509 Iron deficiency anemia, unspecified: Secondary | ICD-10-CM | POA: Diagnosis not present

## 2019-12-25 LAB — HEPATIC FUNCTION PANEL
ALT: 18 U/L (ref 0–35)
AST: 14 U/L (ref 0–37)
Albumin: 4.2 g/dL (ref 3.5–5.2)
Alkaline Phosphatase: 70 U/L (ref 39–117)
Bilirubin, Direct: 0 mg/dL (ref 0.0–0.3)
Total Bilirubin: 0.2 mg/dL (ref 0.2–1.2)
Total Protein: 6.9 g/dL (ref 6.0–8.3)

## 2019-12-25 LAB — CBC WITH DIFFERENTIAL/PLATELET
Basophils Absolute: 0 10*3/uL (ref 0.0–0.1)
Basophils Relative: 0.4 % (ref 0.0–3.0)
Eosinophils Absolute: 0.2 10*3/uL (ref 0.0–0.7)
Eosinophils Relative: 1.7 % (ref 0.0–5.0)
HCT: 38.4 % (ref 36.0–46.0)
Hemoglobin: 12.6 g/dL (ref 12.0–15.0)
Lymphocytes Relative: 26 % (ref 12.0–46.0)
Lymphs Abs: 2.6 10*3/uL (ref 0.7–4.0)
MCHC: 32.8 g/dL (ref 30.0–36.0)
MCV: 82.3 fl (ref 78.0–100.0)
Monocytes Absolute: 0.5 10*3/uL (ref 0.1–1.0)
Monocytes Relative: 5.4 % (ref 3.0–12.0)
Neutro Abs: 6.6 10*3/uL (ref 1.4–7.7)
Neutrophils Relative %: 66.5 % (ref 43.0–77.0)
Platelets: 424 10*3/uL — ABNORMAL HIGH (ref 150.0–400.0)
RBC: 4.67 Mil/uL (ref 3.87–5.11)
RDW: 15.3 % (ref 11.5–15.5)
WBC: 9.9 10*3/uL (ref 4.0–10.5)

## 2019-12-25 LAB — BASIC METABOLIC PANEL
BUN: 15 mg/dL (ref 6–23)
CO2: 27 mEq/L (ref 19–32)
Calcium: 10 mg/dL (ref 8.4–10.5)
Chloride: 101 mEq/L (ref 96–112)
Creatinine, Ser: 0.53 mg/dL (ref 0.40–1.20)
GFR: 126.87 mL/min (ref >60.00)
Glucose, Bld: 143 mg/dL — ABNORMAL HIGH (ref 70–99)
Potassium: 5.1 mEq/L (ref 3.5–5.1)
Sodium: 136 mEq/L (ref 135–145)

## 2019-12-25 LAB — FERRITIN: Ferritin: 11.7 ng/mL (ref 10.0–291.0)

## 2019-12-25 NOTE — Addendum Note (Signed)
Addended by: Tor Netters I on: 12/25/2019 10:14 AM   Modules accepted: Orders

## 2019-12-26 ENCOUNTER — Other Ambulatory Visit: Payer: Self-pay | Admitting: Internal Medicine

## 2019-12-26 DIAGNOSIS — E875 Hyperkalemia: Secondary | ICD-10-CM

## 2019-12-26 NOTE — Progress Notes (Signed)
Order placed for f/u potassium check.  

## 2019-12-27 ENCOUNTER — Encounter: Payer: Self-pay | Admitting: Internal Medicine

## 2019-12-28 NOTE — Telephone Encounter (Signed)
If she is questioning whether she can get the second vaccine because of her potassium - it is ok.   I do not think the potassium lab is related.  Can send her info on foods with increased potassium.  Will recheck lab as discussed.  Is a non fasting lab.

## 2019-12-28 NOTE — Telephone Encounter (Signed)
-----   Message from Einar Pheasant, MD sent at 12/26/2019  5:17 PM EDT ----- Notify pt that her white blood cell count and hgb are wnl.  Platelet count still slightly increased, but stable overall.  Improved from previous check.  Potassium is wnl, but at upper limits of normal.  May be (and probably is just a lab variant - hemolysis).  Confirm not taking any potassium supplements or using an increased amount of salt substitutes.  Would like to recheck potassium in the next couple of weeks to confirm normal.

## 2019-12-29 ENCOUNTER — Ambulatory Visit: Payer: BC Managed Care – PPO | Attending: Internal Medicine

## 2019-12-29 DIAGNOSIS — Z23 Encounter for immunization: Secondary | ICD-10-CM

## 2019-12-29 NOTE — Progress Notes (Signed)
   Covid-19 Vaccination Clinic  Name:  Lindsey Strickland    MRN: [id number] DOB: 1976/03/02  12/29/2019  [name] was observed post Covid-19 immunization for 30 minutes based on pre-vaccination screening without incident. She was provided with Vaccine Information Sheet and instruction to access the V-Safe system.   [name] was instructed to call 911 with any severe reactions post vaccine: Marland Kitchen Difficulty breathing  . Swelling of face and throat  . A fast heartbeat  . A bad rash all over body  . Dizziness and weakness   Immunizations Administered    Name Date Dose VIS Date Route   Pfizer COVID-19 Vaccine 12/29/2019 10:10 AM 0.3 mL 09/30/2018 Intramuscular   Manufacturer: [employer]   Lot: [id number]   [place]: [id number]

## 2020-01-15 ENCOUNTER — Other Ambulatory Visit (INDEPENDENT_AMBULATORY_CARE_PROVIDER_SITE_OTHER): Payer: BC Managed Care – PPO

## 2020-01-15 ENCOUNTER — Other Ambulatory Visit: Payer: Self-pay

## 2020-01-15 DIAGNOSIS — E875 Hyperkalemia: Secondary | ICD-10-CM

## 2020-01-15 DIAGNOSIS — E7800 Pure hypercholesterolemia, unspecified: Secondary | ICD-10-CM | POA: Diagnosis not present

## 2020-01-15 DIAGNOSIS — E119 Type 2 diabetes mellitus without complications: Secondary | ICD-10-CM | POA: Diagnosis not present

## 2020-01-15 LAB — LDL CHOLESTEROL, DIRECT: Direct LDL: 113 mg/dL

## 2020-01-15 LAB — LIPID PANEL
Cholesterol: 192 mg/dL (ref 0–200)
HDL: 36.8 mg/dL — ABNORMAL LOW (ref >39.00)
NonHDL: 155.47
Total CHOL/HDL Ratio: 5
Triglycerides: 316 mg/dL — ABNORMAL HIGH (ref 0.0–149.0)
VLDL: 63.2 mg/dL — ABNORMAL HIGH (ref 0.0–40.0)

## 2020-01-15 LAB — POTASSIUM: Potassium: 4.5 mEq/L (ref 3.5–5.1)

## 2020-01-18 ENCOUNTER — Other Ambulatory Visit: Payer: Self-pay | Admitting: Internal Medicine

## 2020-01-18 LAB — HEMOGLOBIN A1C: Hgb A1c MFr Bld: 8.4 % — ABNORMAL HIGH (ref 4.6–6.5)

## 2020-01-26 ENCOUNTER — Telehealth: Payer: Self-pay | Admitting: Internal Medicine

## 2020-01-26 NOTE — Telephone Encounter (Signed)
[facility] called about pt can reach her at [phone]

## 2020-01-26 NOTE — Telephone Encounter (Signed)
Faxed to [place] at [employer].

## 2020-02-01 ENCOUNTER — Other Ambulatory Visit: Payer: Self-pay | Admitting: Internal Medicine

## 2020-02-02 ENCOUNTER — Telehealth (INDEPENDENT_AMBULATORY_CARE_PROVIDER_SITE_OTHER): Payer: BC Managed Care – PPO | Admitting: Internal Medicine

## 2020-02-02 DIAGNOSIS — I10 Essential (primary) hypertension: Secondary | ICD-10-CM | POA: Diagnosis not present

## 2020-02-02 DIAGNOSIS — E1165 Type 2 diabetes mellitus with hyperglycemia: Secondary | ICD-10-CM

## 2020-02-02 DIAGNOSIS — D509 Iron deficiency anemia, unspecified: Secondary | ICD-10-CM | POA: Diagnosis not present

## 2020-02-02 DIAGNOSIS — D75839 Thrombocytosis, unspecified: Secondary | ICD-10-CM

## 2020-02-02 DIAGNOSIS — D473 Essential (hemorrhagic) thrombocythemia: Secondary | ICD-10-CM

## 2020-02-02 DIAGNOSIS — K219 Gastro-esophageal reflux disease without esophagitis: Secondary | ICD-10-CM

## 2020-02-02 DIAGNOSIS — E7800 Pure hypercholesterolemia, unspecified: Secondary | ICD-10-CM

## 2020-02-02 NOTE — Progress Notes (Signed)
Patient ID: Lindsey Strickland, female   DOB: December 01, 1975, 44 y.o.   MRN: [id number]   Virtual Visit via video Note  This visit type was conducted due to national recommendations for restrictions regarding the COVID-19 pandemic (e.g. social distancing).  This format is felt to be most appropriate for this patient at this time.  All issues noted in this document were discussed and addressed.  No physical exam was performed (except for noted visual exam findings with Video Visits).   I connected with Lindsey Strickland by a video enabled telemedicine application and verified that I am speaking with the correct person using two identifiers. Location patient: home Location provider: work Persons participating in the virtual visit: patient, provider  The limitations, risks, security and privacy concerns of performing an evaluation and management service by video and the availability of in person appointments have been discussed.   It has also been discussed with the patient that there may be a patient responsible charge related to this service. The patient has expressed understanding and has agreed to proceed.   Reason for visit: scheduled follow up.    HPI: Scheduled follow up:  regarding her blood pressure, diabetes and cholesterol.  Discussed recent labs.  a1c 8.4 which is increased from previous checks.  Triglycerides elevated.  Taking pravastatin.  Discussed changing to crestor.  She declines.  She is taking metformin.  Has been trying to adjust her diet.  Discussed low carb diet and exercise.  Discussed other diabetic medications.  Discussed my desire to start trulicity or ozempic.  She declines.  Discussed jardiance.  She declines to start any new medication.  No increased acid reflux reported.  No abdominal pain or bowel change reported.  Has mammogram scheduled 03/11/20.  Pulmonary 03/04/20.  She is walking some. Had questions about flax seed.     ROS: See pertinent positives and negatives per  HPI.  Past Medical History:  Diagnosis Date  . Atypical endometrial hyperplasia   . Diabetes mellitus ([place])   . Diarrhea   . Environmental allergies   . Generalized anxiety disorder   . GERD (gastroesophageal reflux disease)   . Hypercholesterolemia   . Hypertension   . Insomnia   . Major depressive disorder     Past Surgical History:  Procedure Laterality Date  . COLONOSCOPY    . COLONOSCOPY WITH PROPOFOL N/A 05/28/2018   Procedure: COLONOSCOPY WITH PROPOFOL;  Surgeon: Manya Silvas, MD;  Location: [facility] ENDOSCOPY;  Service: Endoscopy;  Laterality: N/A;  . ESOPHAGOGASTRODUODENOSCOPY (EGD) WITH PROPOFOL N/A 05/28/2018   Procedure: ESOPHAGOGASTRODUODENOSCOPY (EGD) WITH PROPOFOL;  Surgeon: Manya Silvas, MD;  Location: [facility] ENDOSCOPY;  Service: Endoscopy;  Laterality: N/A;  . HYSTEROSCOPY WITH D & C     and polyp removal    Family History  Problem Relation Age of Onset  . Hypertension Mother   . Diabetes Mother   . Diverticulosis Mother   . Depression Mother   . Hypercholesterolemia Father   . Hypertension Father   . Cancer Father   . Heart disease Maternal Grandfather   . Diverticulosis Paternal Grandmother   . Hyperlipidemia Paternal Grandmother   . Heart failure Paternal Grandmother   . Cancer Maternal Grandmother   . Alcoholism Paternal Uncle     SOCIAL HX: reviewed.    Current Outpatient Medications:  .  albuterol (PROVENTIL HFA;VENTOLIN HFA) 108 (90 Base) MCG/ACT inhaler, Inhale 2 puffs into the lungs every 6 (six) hours as needed for wheezing., Disp: 1 Inhaler, Rfl: 1 .  amLODipine (NORVASC) 10 MG tablet, TAKE ONE-HALF TABLET BY MOUTH TWICE DAILY, Disp: 90 tablet, Rfl: 0 .  famotidine (PEPCID) 20 MG tablet, TAKE 2 TABLETS (40 MG) BY MOUTH NIGHTLY, Disp: , Rfl:  .  ferrous sulfate 325 (65 FE) MG tablet, Take 325 mg by mouth daily with breakfast., Disp: , Rfl:  .  fluticasone (FLONASE) 50 MCG/ACT nasal spray, Use 2 spray(s) in each nostril once daily,  Disp: 16 g, Rfl: 0 .  glucose blood test strip, 1 each by Other route 2 (two) times daily. Use as instructed, use one strip twice a day, Disp: , Rfl:  .  iron polysaccharides (NU-IRON) 150 MG capsule, Take 1 capsule (150 mg total) by mouth daily., Disp: 30 capsule, Rfl: 2 .  Lancets MISC, by Does not apply route. Use 1 lancets three times a day, Disp: , Rfl:  .  loratadine (ALAVERT) 10 MG tablet, Take 10 mg by mouth daily., Disp: , Rfl:  .  losartan (COZAAR) 100 MG tablet, Take 1 tablet by mouth once daily, Disp: 90 tablet, Rfl: 0 .  metFORMIN (GLUCOPHAGE) 500 MG tablet, Take 2 tablets by mouth twice daily, Disp: 360 tablet, Rfl: 0 .  metoprolol succinate (TOPROL-XL) 100 MG 24 hr tablet, TAKE ONE-HALF TABLET BY MOUTH TWICE DAILY, Disp: 90 tablet, Rfl: 0 .  neomycin-polymyxin b-dexamethasone (MAXITROL) 3.5-10000-0.1 SUSP, Place 2 drops into the right eye every 6 (six) hours., Disp: 5 mL, Rfl: 0 .  omeprazole (PRILOSEC OTC) 20 MG tablet, Take 20 mg by mouth daily., Disp: , Rfl:  .  pravastatin (PRAVACHOL) 40 MG tablet, TAKE 1 TABLET BY MOUTH ONCE DAILY. MUST KEEP APPOINTMENT, Disp: 90 tablet, Rfl: 0 .  Vilazodone HCl (VIIBRYD) 10 MG TABS, Take 1 tablet (10 mg total) by mouth daily., Disp: 30 tablet, Rfl: 0  EXAM:  GENERAL: alert, oriented, appears well and in no acute distress  HEENT: atraumatic, conjunttiva clear, no obvious abnormalities on inspection of external nose and ears  NECK: normal movements of the head and neck  LUNGS: on inspection no signs of respiratory distress, breathing rate appears normal, no obvious gross SOB, gasping or wheezing  CV: no obvious cyanosis  PSYCH/NEURO: pleasant and cooperative, no obvious depression or anxiety, speech and thought processing grossly intact  ASSESSMENT AND PLAN:  Discussed the following assessment and plan:  Type 2 diabetes mellitus with hyperglycemia (HCC) Increased a1c 8.4.  Sugar continues to increase.  Discussed diet, exercise and  weight loss.  Discussed treatment options including ozempic, trulicity or jardiance.  She declines to start any other medication.  Wants to work on diet and exercise.  Continue metformin.  Will check and record sugars and send in readings.  Follow met b and a1c.    Thrombocytosis ([place]) Evaluated by hematology.  Felt to be reactive.  Follow cbc.   Iron deficiency anemia Has seen hematology.  Follow cbc and iron studies.   Hypertension Not checking blood pressure.  Continue losartan and metoprolol.  Follow pressures.  Follow metabolic panel.    Hypercholesterolemia On pravastatin.  Low cholesterol diet and exercise.  Follow lipid panel and liver function tests.  Discussed changing to crestor.  She declines.  Low carb diet info sent.    GERD (gastroesophageal reflux disease) No upper symptoms reported.  Prilosec.      I discussed the assessment and treatment plan with the patient. The patient was provided an opportunity to ask questions and all were answered. The patient agreed with the plan and demonstrated  an understanding of the instructions.   The patient was advised to call back or seek an in-person evaluation if the symptoms worsen or if the condition fails to improve as anticipated.  I spent over 30 minutes with the patient and more than 50% of the time was spent in consultation regarding the above.  Time spent discussing her current labs and symptoms.  Time also spent discussing treatment options and plans for f/u.    Einar Pheasant, MD

## 2020-02-03 ENCOUNTER — Encounter: Payer: Self-pay | Admitting: Internal Medicine

## 2020-02-08 ENCOUNTER — Encounter: Payer: Self-pay | Admitting: Internal Medicine

## 2020-02-08 NOTE — Assessment & Plan Note (Signed)
Has seen hematology.  Follow cbc and iron studies.  

## 2020-02-08 NOTE — Assessment & Plan Note (Signed)
No upper symptoms reported.  Prilosec.  

## 2020-02-08 NOTE — Assessment & Plan Note (Signed)
Not checking blood pressure.  Continue losartan and metoprolol.  Follow pressures.  Follow metabolic panel.

## 2020-02-08 NOTE — Assessment & Plan Note (Signed)
Evaluated by hematology.  Felt to be reactive.  Follow cbc.

## 2020-02-08 NOTE — Assessment & Plan Note (Signed)
Increased a1c 8.4.  Sugar continues to increase.  Discussed diet, exercise and weight loss.  Discussed treatment options including ozempic, trulicity or jardiance.  She declines to start any other medication.  Wants to work on diet and exercise.  Continue metformin.  Will check and record sugars and send in readings.  Follow met b and a1c.

## 2020-02-08 NOTE — Assessment & Plan Note (Addendum)
On pravastatin.  Low cholesterol diet and exercise.  Follow lipid panel and liver function tests.  Discussed changing to crestor.  She declines.  Low carb diet info sent.

## 2020-02-11 NOTE — Telephone Encounter (Signed)
Ok to call in test strips.  I am ok to check urine, but confirm symptoms having.  Confirm no acute issues, such as nausea, vomiting, etc.  If acute issues, will need to be seen.

## 2020-02-12 ENCOUNTER — Other Ambulatory Visit: Payer: Self-pay

## 2020-02-12 MED ORDER — GLUCOSE BLOOD VI STRP: ORAL_STRIP | 12 refills | Status: DC

## 2020-02-16 ENCOUNTER — Other Ambulatory Visit (INDEPENDENT_AMBULATORY_CARE_PROVIDER_SITE_OTHER): Payer: BC Managed Care – PPO

## 2020-02-16 ENCOUNTER — Other Ambulatory Visit: Payer: Self-pay

## 2020-02-16 DIAGNOSIS — R300 Dysuria: Secondary | ICD-10-CM

## 2020-02-16 LAB — URINALYSIS, ROUTINE W REFLEX MICROSCOPIC
Bilirubin Urine: NEGATIVE
Nitrite: NEGATIVE
Specific Gravity, Urine: 1.02 (ref 1.000–1.030)
Urine Glucose: NEGATIVE
Urobilinogen, UA: 0.2 (ref 0.0–1.0)
pH: 6 (ref 5.0–8.0)

## 2020-02-16 NOTE — Progress Notes (Signed)
Future orders placed for urine. Ok per Dr Nicki Reaper

## 2020-02-16 NOTE — Telephone Encounter (Signed)
Please place future orders for urine test

## 2020-02-16 NOTE — Telephone Encounter (Signed)
Urine orders have been placed.

## 2020-02-18 ENCOUNTER — Other Ambulatory Visit: Payer: Self-pay | Admitting: Internal Medicine

## 2020-02-18 DIAGNOSIS — R821 Myoglobinuria: Secondary | ICD-10-CM

## 2020-02-18 LAB — URINE CULTURE
MICRO NUMBER:: 10698418
SPECIMEN QUALITY:: ADEQUATE

## 2020-02-18 NOTE — Progress Notes (Signed)
Order placed for f/u urine 

## 2020-02-19 ENCOUNTER — Other Ambulatory Visit: Payer: Self-pay

## 2020-02-19 MED ORDER — NITROFURANTOIN MONOHYD MACRO 100 MG PO CAPS
100.0000 mg | ORAL_CAPSULE | Freq: Two times a day (BID) | ORAL | 0 refills | Status: DC
Start: 2020-02-19 — End: 2020-07-07

## 2020-02-22 ENCOUNTER — Other Ambulatory Visit: Payer: Self-pay | Admitting: Internal Medicine

## 2020-02-22 NOTE — Telephone Encounter (Signed)
This is an abx we commonly use for UTIs.  Any abx can cause GI side effects.  Needs to take a probiotic daily while on abx and for two weeks after.  She is allergic to some of the other abx we use for UTIs.  States has allergy to augmentin (amoxicillin/pcn). - but states GI intolerance.  Need to confirm allergy had to pcn/amoxicillin.  If she refuses macrobid, then has she ever taken keflex?

## 2020-02-22 NOTE — Telephone Encounter (Signed)
After discussing with pt, she is going to try macrobid and let me know if problems.

## 2020-02-29 ENCOUNTER — Other Ambulatory Visit: Payer: Self-pay

## 2020-02-29 ENCOUNTER — Ambulatory Visit (INDEPENDENT_AMBULATORY_CARE_PROVIDER_SITE_OTHER): Payer: BC Managed Care – PPO | Admitting: Internal Medicine

## 2020-02-29 ENCOUNTER — Encounter: Payer: Self-pay | Admitting: Internal Medicine

## 2020-02-29 VITALS — BP 144/70 | HR 100 | Temp 98.4°F | Resp 16 | Ht 68.0 in | Wt 295.0 lb

## 2020-02-29 DIAGNOSIS — D473 Essential (hemorrhagic) thrombocythemia: Secondary | ICD-10-CM | POA: Diagnosis not present

## 2020-02-29 DIAGNOSIS — F439 Reaction to severe stress, unspecified: Secondary | ICD-10-CM

## 2020-02-29 DIAGNOSIS — K219 Gastro-esophageal reflux disease without esophagitis: Secondary | ICD-10-CM

## 2020-02-29 DIAGNOSIS — I10 Essential (primary) hypertension: Secondary | ICD-10-CM

## 2020-02-29 DIAGNOSIS — D509 Iron deficiency anemia, unspecified: Secondary | ICD-10-CM

## 2020-02-29 DIAGNOSIS — E7800 Pure hypercholesterolemia, unspecified: Secondary | ICD-10-CM

## 2020-02-29 DIAGNOSIS — D75839 Thrombocytosis, unspecified: Secondary | ICD-10-CM

## 2020-02-29 DIAGNOSIS — E1165 Type 2 diabetes mellitus with hyperglycemia: Secondary | ICD-10-CM

## 2020-02-29 NOTE — Progress Notes (Signed)
Patient ID: Lindsey Strickland, female   DOB: Nov 30, 1975, 44 y.o.   MRN: [id number]   Subjective:    Patient ID: Lindsey Strickland, female    DOB: 1975/09/06, 44 y.o.   MRN: [id number]  HPI This visit occurred during the SARS-CoV-2 public health emergency.  Safety protocols were in place, including screening questions prior to the visit, additional usage of staff PPE, and extensive cleaning of exam room while observing appropriate contact time as indicated for disinfecting solutions.  Patient here for a scheduled follow up.  She recently had labs - a1c 8.4.  AM sugars 130-170 and PM sugars before supper 130-140.  She has adjusted her diet.  Lost weight.  Discussed exercise.  No chest pain or sob reported.  No increased cough or congestion.  No abdominal pain reported.  Increased stress. Overall appears to be handling things relatively well.  Has done well with diet adjustment.  Discussed diabetes treatment.  She declines to start any other medication.    Past Medical History:  Diagnosis Date  . Atypical endometrial hyperplasia   . Diabetes mellitus ([place])   . Diarrhea   . Environmental allergies   . Generalized anxiety disorder   . GERD (gastroesophageal reflux disease)   . Hypercholesterolemia   . Hypertension   . Insomnia   . Major depressive disorder    Past Surgical History:  Procedure Laterality Date  . COLONOSCOPY    . COLONOSCOPY WITH PROPOFOL N/A 05/28/2018   Procedure: COLONOSCOPY WITH PROPOFOL;  Surgeon: Manya Silvas, MD;  Location: [facility] ENDOSCOPY;  Service: Endoscopy;  Laterality: N/A;  . ESOPHAGOGASTRODUODENOSCOPY (EGD) WITH PROPOFOL N/A 05/28/2018   Procedure: ESOPHAGOGASTRODUODENOSCOPY (EGD) WITH PROPOFOL;  Surgeon: Manya Silvas, MD;  Location: [facility] ENDOSCOPY;  Service: Endoscopy;  Laterality: N/A;  . HYSTEROSCOPY WITH D & C     and polyp removal   Family History  Problem Relation Age of Onset  . Hypertension Mother   . Diabetes Mother   . Diverticulosis  Mother   . Depression Mother   . Hypercholesterolemia Father   . Hypertension Father   . Cancer Father   . Heart disease Maternal Grandfather   . Diverticulosis Paternal Grandmother   . Hyperlipidemia Paternal Grandmother   . Heart failure Paternal Grandmother   . Cancer Maternal Grandmother   . Alcoholism Paternal Uncle    Social History   Socioeconomic History  . Marital status: Single    Spouse name: Not on file  . Number of children: 2  . Years of education: Not on file  . Highest education level: High school graduate  Occupational History    Employer: [employer]  Tobacco Use  . Smoking status: Former Smoker    Quit date: 08/06/2000    Years since quitting: 19.6  . Smokeless tobacco: Never Used  Vaping Use  . Vaping Use: Never used  Substance and Sexual Activity  . Alcohol use: Yes    Alcohol/week: 0.0 standard drinks    Comment: rare  . Drug use: No  . Sexual activity: Yes    Birth control/protection: I.U.D.  Other Topics Concern  . Not on file  Social History Narrative  . Not on file   Social Determinants of Health   Financial Resource Strain:   . Difficulty of Paying Living Expenses:   Food Insecurity:   . Worried About Charity fundraiser in the Last Year:   . Arboriculturist in the Last Year:   Transportation Needs:   .  Lack of Transportation (Medical):   Marland Kitchen Lack of Transportation (Non-Medical):   Physical Activity:   . Days of Exercise per Week:   . Minutes of Exercise per Session:   Stress:   . Feeling of Stress :   Social Connections:   . Frequency of Communication with Friends and Family:   . Frequency of Social Gatherings with Friends and Family:   . Attends Religious Services:   . Active Member of Clubs or Organizations:   . Attends Archivist Meetings:   Marland Kitchen Marital Status:     Outpatient Encounter Medications as of 02/29/2020  Medication Sig  . amLODipine (NORVASC) 10 MG tablet TAKE ONE-HALF TABLET BY MOUTH TWICE DAILY  .  famotidine (PEPCID) 20 MG tablet TAKE 2 TABLETS (40 MG) BY MOUTH NIGHTLY  . ferrous sulfate 325 (65 FE) MG tablet Take 325 mg by mouth daily with breakfast.  . fluticasone (FLONASE) 50 MCG/ACT nasal spray Use 2 spray(s) in each nostril once daily  . glucose blood test strip Use as directed to check sugars BID. Dx E11.9  . iron polysaccharides (NU-IRON) 150 MG capsule Take 1 capsule (150 mg total) by mouth daily.  . Lancets MISC by Does not apply route. Use 1 lancets three times a day  . loratadine (ALAVERT) 10 MG tablet Take 10 mg by mouth daily.  Marland Kitchen losartan (COZAAR) 100 MG tablet Take 1 tablet by mouth once daily  . metFORMIN (GLUCOPHAGE) 500 MG tablet Take 2 tablets by mouth twice daily  . metoprolol succinate (TOPROL-XL) 100 MG 24 hr tablet TAKE ONE-HALF TABLET BY MOUTH TWICE DAILY  . neomycin-polymyxin b-dexamethasone (MAXITROL) 3.5-10000-0.1 SUSP Place 2 drops into the right eye every 6 (six) hours.  . nitrofurantoin, macrocrystal-monohydrate, (MACROBID) 100 MG capsule Take 1 capsule (100 mg total) by mouth 2 (two) times daily.  Marland Kitchen omeprazole (PRILOSEC OTC) 20 MG tablet Take 20 mg by mouth daily.  . Vilazodone HCl (VIIBRYD) 10 MG TABS Take 1 tablet (10 mg total) by mouth daily.  . [DISCONTINUED] albuterol (PROVENTIL HFA;VENTOLIN HFA) 108 (90 Base) MCG/ACT inhaler Inhale 2 puffs into the lungs every 6 (six) hours as needed for wheezing.  . [DISCONTINUED] pravastatin (PRAVACHOL) 40 MG tablet TAKE 1 TABLET BY MOUTH ONCE DAILY. MUST KEEP APPOINTMENT   No facility-administered encounter medications on file as of 02/29/2020.    Review of Systems  Constitutional: Negative for appetite change and unexpected weight change.  HENT: Negative for congestion and sinus pressure.   Respiratory: Negative for cough, chest tightness and shortness of breath.   Cardiovascular: Negative for chest pain, palpitations and leg swelling.  Gastrointestinal: Negative for abdominal pain, nausea and vomiting.    Genitourinary: Negative for difficulty urinating and dysuria.  Musculoskeletal: Negative for joint swelling and myalgias.  Skin: Negative for color change and rash.  Neurological: Negative for dizziness, light-headedness and headaches.  Psychiatric/Behavioral: Negative for agitation and dysphoric mood.       Objective:    Physical Exam Vitals reviewed.  Constitutional:      General: She is not in acute distress.    Appearance: Normal appearance.  HENT:     Head: Normocephalic and atraumatic.     Right Ear: External ear normal.     Left Ear: External ear normal.  Eyes:     General: No scleral icterus.       Right eye: No discharge.        Left eye: No discharge.     Conjunctiva/sclera: Conjunctivae normal.  Neck:  Thyroid: No thyromegaly.  Cardiovascular:     Rate and Rhythm: Normal rate and regular rhythm.  Pulmonary:     Effort: No respiratory distress.     Breath sounds: Normal breath sounds. No wheezing.  Abdominal:     General: Bowel sounds are normal.     Palpations: Abdomen is soft.     Tenderness: There is no abdominal tenderness.  Musculoskeletal:        General: No swelling or tenderness.     Cervical back: Neck supple. No tenderness.  Lymphadenopathy:     Cervical: No cervical adenopathy.  Skin:    Findings: No erythema or rash.  Neurological:     Mental Status: She is alert.  Psychiatric:        Mood and Affect: Mood normal.        Behavior: Behavior normal.     BP (!) 144/70   Pulse 100   Temp 98.4 F (36.9 C)   Resp 16   Ht _0  (1.727 m)   Wt (!) 295 lb (133.8 kg)   SpO2 99%   BMI 44.85 kg/m  Wt Readings from Last 3 Encounters:  02/29/20 (!) 295 lb (133.8 kg)  02/02/20 (!) 308 lb (139.7 kg)  11/25/19 (!) 308 lb (139.7 kg)     Lab Results  Component Value Date   WBC 9.9 12/25/2019   HGB 12.6 12/25/2019   HCT 38.4 12/25/2019   PLT 424.0 (H) 12/25/2019   GLUCOSE 143 (H) 12/25/2019   CHOL 192 01/15/2020   TRIG 316.0 (H)  01/15/2020   HDL 36.80 (L) 01/15/2020   LDLDIRECT 113.0 01/15/2020   LDLCALC 114 (H) 12/15/2015   ALT 18 12/25/2019   AST 14 12/25/2019   NA 136 12/25/2019   K 4.5 01/15/2020   CL 101 12/25/2019   CREATININE 0.53 12/25/2019   BUN 15 12/25/2019   CO2 27 12/25/2019   TSH 3.56 11/25/2019   INR 0.93 05/05/2018   HGBA1C 8.4 (H) 01/15/2020   MICROALBUR 1.9 10/22/2017       Assessment & Plan:   Problem List Items Addressed This Visit    GERD (gastroesophageal reflux disease)    On prilosec.  No upper symptoms.        Hypercholesterolemia    On pravastatin.  Low cholesterol diet and exercise.  Follow lipid panel and liver function tests.  She declines changing to crestor.        Hypertension - Primary    Blood pressure on recheck improved.  Continue amlodipine. Losartan and metoprolol.  Follow pressures.  Follow metabolic panel.        Iron deficiency anemia    Has seen hematology.  Follow cbc and iron studies.        Severe obesity (BMI >= 40) (HCC)    She is adjusting her diet.  Is losing weight. Discussed diet and exercise.  [facility] lipid diet information given.       Stress    Increased stress as outlined.  Was seeing a Social worker.  Overall appears to be handling things relatively well.  Follow.        Thrombocytosis ([place])    Evaluated by hematology.  Felt to be reactive.  Follow cbc.       Type 2 diabetes mellitus with hyperglycemia (HCC)    Increased a1c.  She has adjusted her diet.  Sugars as outlined.  Has lost weight.  Discussed diet and exercise today.  Discussed other treatment options, including jardiance, ozempic,  trulicity, etc.  She continues to decline.  Follow met b and a1c.            Einar Pheasant, MD

## 2020-03-02 ENCOUNTER — Other Ambulatory Visit: Payer: Self-pay

## 2020-03-02 ENCOUNTER — Encounter: Payer: Self-pay | Admitting: Internal Medicine

## 2020-03-02 MED ORDER — ALBUTEROL SULFATE HFA 108 (90 BASE) MCG/ACT IN AERS: 2.0000 | INHALATION_SPRAY | Freq: Four times a day (QID) | RESPIRATORY_TRACT | 2 refills | Status: DC | PRN

## 2020-03-04 ENCOUNTER — Institutional Professional Consult (permissible substitution): Payer: BC Managed Care – PPO | Admitting: Internal Medicine

## 2020-03-07 ENCOUNTER — Other Ambulatory Visit: Payer: Self-pay | Admitting: Internal Medicine

## 2020-03-11 ENCOUNTER — Encounter: Payer: Self-pay | Admitting: Internal Medicine

## 2020-03-13 ENCOUNTER — Encounter: Payer: Self-pay | Admitting: Internal Medicine

## 2020-03-13 NOTE — Assessment & Plan Note (Signed)
Blood pressure on recheck improved.  Continue amlodipine. Losartan and metoprolol.  Follow pressures.  Follow metabolic panel.

## 2020-03-13 NOTE — Assessment & Plan Note (Signed)
Has seen hematology.  Follow cbc and iron studies.  

## 2020-03-13 NOTE — Assessment & Plan Note (Signed)
On prilosec.  No upper symptoms.  

## 2020-03-13 NOTE — Assessment & Plan Note (Signed)
Increased a1c.  She has adjusted her diet.  Sugars as outlined.  Has lost weight.  Discussed diet and exercise today.  Discussed other treatment options, including jardiance, ozempic, trulicity, etc.  She continues to decline.  Follow met b and a1c.

## 2020-03-13 NOTE — Assessment & Plan Note (Signed)
Increased stress as outlined.  Was seeing a Social worker.  Overall appears to be handling things relatively well.  Follow.

## 2020-03-13 NOTE — Assessment & Plan Note (Signed)
Evaluated by hematology.  Felt to be reactive.  Follow cbc.

## 2020-03-13 NOTE — Assessment & Plan Note (Signed)
She is adjusting her diet.  Is losing weight. Discussed diet and exercise.  [facility] lipid diet information given.

## 2020-03-13 NOTE — Assessment & Plan Note (Signed)
On pravastatin.  Low cholesterol diet and exercise.  Follow lipid panel and liver function tests.  She declines changing to crestor.

## 2020-03-25 ENCOUNTER — Other Ambulatory Visit: Payer: Self-pay | Admitting: Internal Medicine

## 2020-04-07 ENCOUNTER — Other Ambulatory Visit: Payer: Self-pay

## 2020-04-07 ENCOUNTER — Ambulatory Visit: Payer: BC Managed Care – PPO | Admitting: Pulmonary Disease

## 2020-04-07 ENCOUNTER — Encounter: Payer: Self-pay | Admitting: Pulmonary Disease

## 2020-04-07 VITALS — BP 124/80 | HR 97 | Ht 68.0 in | Wt 289.2 lb

## 2020-04-07 DIAGNOSIS — R0683 Snoring: Secondary | ICD-10-CM

## 2020-04-07 NOTE — Patient Instructions (Signed)
Will arrange for home sleep study Will call to arrange for follow up after sleep study reviewed  

## 2020-04-07 NOTE — Progress Notes (Signed)
[facility]  Chief Complaint  Patient presents with   Consult    Patient snores and has for many years. Her eyes/vision has been blurry and was told previosly that she has inflammed cornea, went to a different provider and was told that her eyes not close all the way when she sleeps. Wakes up 1-4 times a night to go to bathroom.     Constitutional:  BP 124/80 (BP Location: Left Arm, Patient Position: Sitting, Cuff Size: Large)    Pulse 97    Ht 5\' 8"  (1.727 m)    Wt 289 lb 3.2 oz (131.2 kg)    SpO2 99%    BMI 43.97 kg/m   Past Medical History:  Endometrial hyperplasia, DM, Allergies, Anxiety, GERD, HLD, HTN, Depression  Past Surgical History:  Her  has a past surgical history that includes Colonoscopy; Hysteroscopy with D & C; Colonoscopy with propofol (N/A, 05/28/2018); and Esophagogastroduodenoscopy (egd) with propofol (N/A, 05/28/2018).  Brief Summary:  Lindsey Strickland is a 44 y.o. female former smoker with snoring.      Subjective:  She has been experiencing eye trouble.  She saw an eye doctor and was told she doesn't close her eyes while asleep, and this could be related to sleep apnea.  She snores, and will stop breathing at night.  She wakes up feeling her heart racing, especially when sleeping on her back.  She always feels tired.  She has a mouth guard for bruxism, but ends up spitting it out midway through the night.  She goes to sleep at 9 pm.  She falls asleep 10 minutes.  She wakes up several times to use the bathroom.  She gets out of bed at 615 am.  She feels tired in the morning.  She denies morning headache.  She does not use anything to help her fall sleep or stay awake.  She denies sleep walking, sleep talking, bruxism, or nightmares.  There is no history of restless legs.  She denies sleep hallucinations, sleep paralysis, or cataplexy.  The Epworth score is 8 out of 24.    Physical Exam:   Appearance - well kempt     ENMT - no sinus tenderness, no oral exudate, no LAN, Mallampati 4 airway, no stridor  Respiratory - equal breath sounds bilaterally, no wheezing or rales  CV - s1s2 regular rate and rhythm, no murmurs  Ext - no clubbing, no edema  Skin - no rashes  Psych - normal mood and affect   Sleep Tests:    Social History:  She  reports that she quit smoking about 19 years ago. She has never used smokeless tobacco. She reports current alcohol use. She reports that she does not use drugs.  Family History:  Her family history includes Alcoholism in her paternal uncle; Cancer in her father and maternal grandmother; Depression in her mother; Diabetes in her mother; Diverticulosis in her mother and paternal grandmother; Heart disease in her maternal grandfather; Heart failure in her paternal grandmother; Hypercholesterolemia in her father; Hyperlipidemia in her paternal grandmother; Hypertension in her father and mother.    Discussion:  She has snoring, sleep disruption, apnea, and daytime sleepiness.  She has history of hypertension and mood disorder.  Her BMI is > 35.  I am concerned she could have obstructive sleep apnea.  Assessment/Plan:   Snoring with excessive daytime sleepiness. - will need to arrange for a home sleep study  Obesity. - discussed how weight  can impact sleep and risk for sleep disordered breathing - discussed options to assist with weight loss: combination of diet modification, cardiovascular and strength training exercises  Cardiovascular risk. - had an extensive discussion regarding the adverse health consequences related to untreated sleep disordered breathing - specifically discussed the risks for hypertension, coronary artery disease, cardiac dysrhythmias, cerebrovascular disease, and diabetes - lifestyle modification discussed  Safe driving practices. - discussed how sleep disruption can increase risk of accidents, particularly when driving - safe driving  practices were discussed  Therapies for obstructive sleep apnea. - if the sleep study shows significant sleep apnea, then various therapies for treatment were reviewed: CPAP, oral appliance, and surgical interventions     Time Spent Involved in Patient Care on Day of Examination:  34 minutes  Follow up:  Patient Instructions  Will arrange for home sleep study Will call to arrange for follow up after sleep study reviewed    Medication List:   Allergies as of 04/07/2020      Reactions   Tetracycline Other (See Comments)   Other reaction(s): Unknown   Augmentin [amoxicillin-pot Clavulanate]    GI intolerance   Hctz [hydrochlorothiazide]    Lisinopril    Other reaction(s): Other (See Comments) cough   Other    Mycins   Prednisone    Chest pain   Ciprofloxacin Nausea Only, Palpitations      Medication List       Accurate as of April 07, 2020 10:52 AM. If you have any questions, ask your nurse or doctor.        Alavert 10 MG tablet Generic drug: loratadine Take 10 mg by mouth daily.   albuterol 108 (90 Base) MCG/ACT inhaler Commonly known as: VENTOLIN HFA Inhale 2 puffs into the lungs every 6 (six) hours as needed for wheezing.   amLODipine 10 MG tablet Commonly known as: NORVASC TAKE ONE-HALF TABLET BY MOUTH TWICE DAILY   famotidine 20 MG tablet Commonly known as: PEPCID TAKE 2 TABLETS (40 MG) BY MOUTH NIGHTLY   ferrous sulfate 325 (65 FE) MG tablet Take 325 mg by mouth daily with breakfast.   fluticasone 50 MCG/ACT nasal spray Commonly known as: FLONASE Use 2 spray(s) in each nostril once daily   glucose blood test strip Use as directed to check sugars BID. Dx E11.9   iron polysaccharides 150 MG capsule Commonly known as: Nu-Iron Take 1 capsule (150 mg total) by mouth daily.   Lancets Misc by Does not apply route. Use 1 lancets three times a day   losartan 100 MG tablet Commonly known as: COZAAR Take 1 tablet by mouth once daily     metFORMIN 500 MG tablet Commonly known as: GLUCOPHAGE Take 2 tablets by mouth twice daily   metoprolol succinate 100 MG 24 hr tablet Commonly known as: TOPROL-XL TAKE ONE-HALF TABLET BY MOUTH TWICE DAILY   neomycin-polymyxin b-dexamethasone 3.5-10000-0.1 Susp Commonly known as: MAXITROL Place 2 drops into the right eye every 6 (six) hours.   nitrofurantoin (macrocrystal-monohydrate) 100 MG capsule Commonly known as: Macrobid Take 1 capsule (100 mg total) by mouth 2 (two) times daily.   pravastatin 40 MG tablet Commonly known as: PRAVACHOL TAKE 1 TABLET BY MOUTH ONCE DAILY. MUST KEEP APPOINTMENT.   PriLOSEC OTC 20 MG tablet Generic drug: omeprazole Take 20 mg by mouth daily.   Vilazodone HCl 10 MG Tabs Commonly known as: Viibryd Take 1 tablet (10 mg total) by mouth daily.       Signature:  Chesley Mires,  MD [place] Pager - [phone] 04/07/2020, 10:52 AM

## 2020-04-18 ENCOUNTER — Other Ambulatory Visit: Payer: Self-pay | Admitting: Internal Medicine

## 2020-04-25 ENCOUNTER — Other Ambulatory Visit: Payer: Self-pay | Admitting: Internal Medicine

## 2020-04-26 ENCOUNTER — Telehealth: Payer: Self-pay | Admitting: *Deleted

## 2020-04-26 DIAGNOSIS — E1165 Type 2 diabetes mellitus with hyperglycemia: Secondary | ICD-10-CM

## 2020-04-26 DIAGNOSIS — D75839 Thrombocytosis, unspecified: Secondary | ICD-10-CM

## 2020-04-26 DIAGNOSIS — I10 Essential (primary) hypertension: Secondary | ICD-10-CM

## 2020-04-26 DIAGNOSIS — E7800 Pure hypercholesterolemia, unspecified: Secondary | ICD-10-CM

## 2020-04-26 NOTE — Telephone Encounter (Signed)
appt note states "fasting labs" however only urine orders found. Please place future orders & advice if urine is needed.   Thanks

## 2020-04-27 ENCOUNTER — Other Ambulatory Visit: Payer: BC Managed Care – PPO

## 2020-04-27 NOTE — Telephone Encounter (Signed)
Pt did not show for lab appt after confirming appt.

## 2020-04-27 NOTE — Telephone Encounter (Signed)
Orders placed for labs.  I do need a urine microalbumin and a regular urinalysis.  (just make a note to hold urine for culture).  I am not aware of her having any urinary symptoms

## 2020-04-29 ENCOUNTER — Ambulatory Visit: Payer: BC Managed Care – PPO | Admitting: Internal Medicine

## 2020-04-29 ENCOUNTER — Ambulatory Visit: Payer: BC Managed Care – PPO

## 2020-04-29 DIAGNOSIS — Z0289 Encounter for other administrative examinations: Secondary | ICD-10-CM

## 2020-04-29 DIAGNOSIS — R0683 Snoring: Secondary | ICD-10-CM

## 2020-04-29 DIAGNOSIS — G4733 Obstructive sleep apnea (adult) (pediatric): Secondary | ICD-10-CM | POA: Diagnosis not present

## 2020-05-03 ENCOUNTER — Telehealth: Payer: Self-pay | Admitting: Pulmonary Disease

## 2020-05-03 DIAGNOSIS — G4733 Obstructive sleep apnea (adult) (pediatric): Secondary | ICD-10-CM

## 2020-05-03 NOTE — Telephone Encounter (Signed)
HST 05/01/20 >> AHI 6.6, SpO2 low 88%   Please inform her that her sleep study shows mild obstructive sleep apnea.  Please arrange for ROV with me or NP to discuss treatment options.

## 2020-05-03 NOTE — Telephone Encounter (Signed)
Called and spoke with patient about sleep study results per Dr Halford Chessman. Patient expressed understanding. Scheduled appointment per patient request for 07/07/2020 at 930am in [place] with Dr Halford Chessman. Nothing further needed at this time.

## 2020-05-04 ENCOUNTER — Encounter: Payer: Self-pay | Admitting: Internal Medicine

## 2020-05-05 NOTE — Telephone Encounter (Signed)
Ok to go ahead and schedule lab appt and then I can see where to work in for f/u appt.

## 2020-05-11 NOTE — Telephone Encounter (Signed)
Pt moved lab appt up and kept follow up for 12/30

## 2020-05-11 NOTE — Telephone Encounter (Signed)
Can you get her scheduled for labs and then see if there is a spot she is agreeable to move her appt with Scott up?

## 2020-05-22 ENCOUNTER — Other Ambulatory Visit: Payer: Self-pay | Admitting: Internal Medicine

## 2020-05-30 LAB — HM DIABETES EYE EXAM

## 2020-06-05 ENCOUNTER — Other Ambulatory Visit: Payer: Self-pay | Admitting: Internal Medicine

## 2020-06-08 ENCOUNTER — Other Ambulatory Visit: Payer: Self-pay

## 2020-06-08 ENCOUNTER — Other Ambulatory Visit (INDEPENDENT_AMBULATORY_CARE_PROVIDER_SITE_OTHER): Payer: BC Managed Care – PPO

## 2020-06-08 DIAGNOSIS — E7800 Pure hypercholesterolemia, unspecified: Secondary | ICD-10-CM | POA: Diagnosis not present

## 2020-06-08 DIAGNOSIS — D75839 Thrombocytosis, unspecified: Secondary | ICD-10-CM

## 2020-06-08 DIAGNOSIS — E1165 Type 2 diabetes mellitus with hyperglycemia: Secondary | ICD-10-CM

## 2020-06-08 LAB — HEPATIC FUNCTION PANEL
ALT: 13 U/L (ref 0–35)
AST: 10 U/L (ref 0–37)
Albumin: 4.2 g/dL (ref 3.5–5.2)
Alkaline Phosphatase: 65 U/L (ref 39–117)
Bilirubin, Direct: 0.1 mg/dL (ref 0.0–0.3)
Total Bilirubin: 0.4 mg/dL (ref 0.2–1.2)
Total Protein: 6.9 g/dL (ref 6.0–8.3)

## 2020-06-08 LAB — CBC WITH DIFFERENTIAL/PLATELET
Basophils Absolute: 0.1 10*3/uL (ref 0.0–0.1)
Basophils Relative: 0.5 % (ref 0.0–3.0)
Eosinophils Absolute: 0.1 10*3/uL (ref 0.0–0.7)
Eosinophils Relative: 1.3 % (ref 0.0–5.0)
HCT: 37.6 % (ref 36.0–46.0)
Hemoglobin: 12.1 g/dL (ref 12.0–15.0)
Lymphocytes Relative: 24.4 % (ref 12.0–46.0)
Lymphs Abs: 2.6 10*3/uL (ref 0.7–4.0)
MCHC: 32.1 g/dL (ref 30.0–36.0)
MCV: 81.4 fl (ref 78.0–100.0)
Monocytes Absolute: 0.7 10*3/uL (ref 0.1–1.0)
Monocytes Relative: 6.8 % (ref 3.0–12.0)
Neutro Abs: 7.1 10*3/uL (ref 1.4–7.7)
Neutrophils Relative %: 67 % (ref 43.0–77.0)
Platelets: 425 10*3/uL — ABNORMAL HIGH (ref 150.0–400.0)
RBC: 4.62 Mil/uL (ref 3.87–5.11)
RDW: 15.2 % (ref 11.5–15.5)
WBC: 10.6 10*3/uL — ABNORMAL HIGH (ref 4.0–10.5)

## 2020-06-08 LAB — BASIC METABOLIC PANEL
BUN: 14 mg/dL (ref 6–23)
CO2: 26 mEq/L (ref 19–32)
Calcium: 9.2 mg/dL (ref 8.4–10.5)
Chloride: 101 mEq/L (ref 96–112)
Creatinine, Ser: 0.51 mg/dL (ref 0.40–1.20)
GFR: 115.59 mL/min (ref >60.00)
Glucose, Bld: 158 mg/dL — ABNORMAL HIGH (ref 70–99)
Potassium: 4.4 mEq/L (ref 3.5–5.1)
Sodium: 136 mEq/L (ref 135–145)

## 2020-06-08 LAB — LIPID PANEL
Cholesterol: 169 mg/dL (ref 0–200)
HDL: 38.3 mg/dL — ABNORMAL LOW (ref >39.00)
NonHDL: 130.86
Total CHOL/HDL Ratio: 4
Triglycerides: 249 mg/dL — ABNORMAL HIGH (ref 0.0–149.0)
VLDL: 49.8 mg/dL — ABNORMAL HIGH (ref 0.0–40.0)

## 2020-06-08 LAB — LDL CHOLESTEROL, DIRECT: Direct LDL: 101 mg/dL

## 2020-06-08 LAB — MICROALBUMIN / CREATININE URINE RATIO
Creatinine,U: 145.4 mg/dL
Microalb Creat Ratio: 2.5 mg/g (ref 0.0–30.0)
Microalb, Ur: 3.7 mg/dL — ABNORMAL HIGH (ref 0.0–1.9)

## 2020-06-08 LAB — HEMOGLOBIN A1C: Hgb A1c MFr Bld: 7.3 % — ABNORMAL HIGH (ref 4.6–6.5)

## 2020-06-15 ENCOUNTER — Other Ambulatory Visit: Payer: Self-pay | Admitting: Internal Medicine

## 2020-06-15 DIAGNOSIS — E1165 Type 2 diabetes mellitus with hyperglycemia: Secondary | ICD-10-CM

## 2020-06-16 ENCOUNTER — Telehealth: Payer: Self-pay

## 2020-06-16 ENCOUNTER — Other Ambulatory Visit: Payer: Self-pay | Admitting: Internal Medicine

## 2020-06-16 NOTE — Chronic Care Management (AMB) (Signed)
  Chronic Care Management   Outreach Note  06/16/2020 Name: AKYIA BORELLI MRN: [id number] DOB: [birth year]  SHERREL PLOCH is a 44 y.o. year old female who is a primary care patient of Einar Pheasant, MD. I reached out to [employer] by phone today in response to a referral sent by Ms. Alvino Chapel Alewine's PCP, Dr. Nicki Reaper     An unsuccessful telephone outreach was attempted today. The patient was referred to the case management team for assistance with care management and care coordination.   Follow Up Plan: A HIPAA compliant phone message was left for the patient providing contact information and requesting a return call.  The care management team will reach out to the patient again over the next 7 days.  If patient returns call to provider office, please advise to call [facility]  at [address], [place], [place],  [address] Direct Dial: [phone] [email or web address]

## 2020-06-22 NOTE — Chronic Care Management (AMB) (Signed)
  Care Management   Outreach Note  06/22/2020 Name: Lindsey Strickland MRN: [id number] DOB: 28-Feb-1976  Referred by: Einar Pheasant, MD Reason for referral : Care Coordination (outreach to schedule referral for Pharm D ) and Care Coordination (Second outreach to schedule referral for Pharm D )   An unsuccessful telephone outreach was attempted today. The patient was referred to the case management team for assistance with care management and care coordination.   Follow Up Plan: A HIPAA compliant phone message was left for the patient providing contact information and requesting a return call.  The care management team will reach out to the patient again over the next 7 days.  If patient returns call to provider office, please advise to call [facility] at [place], [place], [place], [place] [address] Direct Dial: [phone] [email or web address]: [email or web address]

## 2020-06-28 NOTE — Chronic Care Management (AMB) (Signed)
  Care Management   Outreach Note  06/28/2020 Name: Lindsey Strickland MRN: [id number] DOB: 07-11-1976  Referred by: Einar Pheasant, MD Reason for referral : Care Coordination (outreach to schedule referral for Pharm D ), Care Coordination (Second outreach to schedule referral for Pharm D ), and Care Coordination (third outreach to schedule referral for Pharm D )   Third unsuccessful telephone outreach was attempted today. The patient was referred to the case management team for assistance with care management and care coordination. The patient's primary care provider has been notified of our unsuccessful attempts to make or maintain contact with the patient. The care management team is pleased to engage with this patient at any time in the future should he/she be interested in assistance from the care management team.   Follow Up Plan: We have been unable to make contact with the patient for follow up. The care management team is available to follow up with the patient after provider conversation with the patient regarding recommendation for care management engagement and subsequent re-referral to the care management team.    Noreene Larsson, [address], [place], [place] [address] Direct Dial: [phone] [email or web address]

## 2020-07-07 ENCOUNTER — Other Ambulatory Visit: Payer: Self-pay

## 2020-07-07 ENCOUNTER — Ambulatory Visit: Payer: BC Managed Care – PPO | Admitting: Pulmonary Disease

## 2020-07-07 ENCOUNTER — Encounter: Payer: Self-pay | Admitting: Pulmonary Disease

## 2020-07-07 VITALS — BP 140/90 | HR 104 | Temp 98.0°F | Ht 68.0 in | Wt 288.4 lb

## 2020-07-07 DIAGNOSIS — E669 Obesity, unspecified: Secondary | ICD-10-CM

## 2020-07-07 DIAGNOSIS — G4730 Sleep apnea, unspecified: Secondary | ICD-10-CM | POA: Diagnosis not present

## 2020-07-07 DIAGNOSIS — G4733 Obstructive sleep apnea (adult) (pediatric): Secondary | ICD-10-CM

## 2020-07-07 NOTE — Progress Notes (Signed)
[facility]  Chief Complaint  Patient presents with  . Follow-up    review home sleep study results    Constitutional:  BP 140/90 (BP Location: Left Arm, Cuff Size: Large)   Pulse (!) 104   Temp 98 F (36.7 C) (Temporal)   Ht 5\' 8"  (1.727 m)   Wt 288 lb 6.4 oz (130.8 kg)   SpO2 99%   BMI 43.85 kg/m   Past Medical History:  Endometrial hyperplasia, DM, Allergies, Anxiety, GERD, HLD, HTN, Depression  Past Surgical History:  Her  has a past surgical history that includes Colonoscopy; Hysteroscopy with D & C; Colonoscopy with propofol (N/A, 05/28/2018); and Esophagogastroduodenoscopy (egd) with propofol (N/A, 05/28/2018).  Brief Summary:  Lindsey Strickland is a 44 y.o. female former smoker with obstructive sleep apnea.      Subjective:   She had home sleep study in September.  Showed mild obstructive sleep apnea.  She has a boil-and-bite mouth guard for bruxism.  Her husband used [employer] for CPAP set up and had a difficult time with their services.  Physical Exam:   Appearance - well kempt   ENMT - no sinus tenderness, no oral exudate, no LAN, Mallampati 4 airway, no stridor  Respiratory - equal breath sounds bilaterally, no wheezing or rales  CV - s1s2 regular rate and rhythm, no murmurs  Ext - no clubbing, no edema  Skin - no rashes  Psych - normal mood and affect   Sleep Tests:   HST 05/01/20 >> AHI 6.6, SpO2 low 88%  Social History:  She  reports that she quit smoking about 19 years ago. She has never used smokeless tobacco. She reports current alcohol use. She reports that she does not use drugs.  Family History:  Her family history includes Alcoholism in her paternal uncle; Cancer in her father and maternal grandmother; Depression in her mother; Diabetes in her mother; Diverticulosis in her mother and paternal grandmother; Heart disease in her maternal grandfather; Heart failure in her paternal grandmother;  Hypercholesterolemia in her father; Hyperlipidemia in her paternal grandmother; Hypertension in her father and mother.     Assessment/Plan:   Obstructive sleep apnea. - reviewed her sleep study results with her and her husband - discussed how untreated sleep apnea can impact her health - treatment options reviewed - will arrange for auto CPAP set up  Obesity. - she is to continue her weight loss efforts  Bruxism. - okay to continue mouth guard for now - she will f/u with her dentist   Time Spent Involved in Patient Care on Day of Examination:  31 minutes  Follow up:  Patient Instructions  Will arrange for auto CPAP set up  Follow up in 4 months   Medication List:   Allergies as of 07/07/2020      Reactions   Tetracycline Other (See Comments)   Other reaction(s): Unknown   Augmentin [amoxicillin-pot Clavulanate]    GI intolerance   Hctz [hydrochlorothiazide]    Lisinopril    Other reaction(s): Other (See Comments) cough   Other    Mycins   Prednisone    Chest pain   Ciprofloxacin Nausea Only, Palpitations      Medication List       Accurate as of July 07, 2020 10:07 AM. If you have any questions, ask your nurse or doctor.        STOP taking these medications   ferrous sulfate 325 (65 FE) MG tablet Stopped  by: Chesley Mires, MD   iron polysaccharides 150 MG capsule Commonly known as: Nu-Iron Stopped by: Chesley Mires, MD   neomycin-polymyxin b-dexamethasone 3.5-10000-0.1 Susp Commonly known as: MAXITROL Stopped by: Chesley Mires, MD   nitrofurantoin (macrocrystal-monohydrate) 100 MG capsule Commonly known as: Macrobid Stopped by: Chesley Mires, MD   PriLOSEC OTC 20 MG tablet Generic drug: omeprazole Stopped by: Chesley Mires, MD   Vilazodone HCl 10 MG Tabs Commonly known as: Viibryd Stopped by: Chesley Mires, MD     TAKE these medications   Alavert 10 MG tablet Generic drug: loratadine Take 10 mg by mouth daily.   albuterol 108 (90 Base)  MCG/ACT inhaler Commonly known as: VENTOLIN HFA Inhale 2 puffs into the lungs every 6 (six) hours as needed for wheezing.   amLODipine 10 MG tablet Commonly known as: NORVASC Take 1/2 (one-half) tablet by mouth twice daily   famotidine 20 MG tablet Commonly known as: PEPCID TAKE 2 TABLETS (40 MG) BY MOUTH NIGHTLY   fluticasone 50 MCG/ACT nasal spray Commonly known as: FLONASE Use 2 spray(s) in each nostril once daily   glucose blood test strip Use as directed to check sugars BID. Dx E11.9   Lancets Misc by Does not apply route. Use 1 lancets three times a day   losartan 100 MG tablet Commonly known as: COZAAR Take 1 tablet by mouth once daily   metFORMIN 500 MG tablet Commonly known as: GLUCOPHAGE Take 2 tablets by mouth twice daily   metoprolol succinate 100 MG 24 hr tablet Commonly known as: TOPROL-XL TAKE ONE-HALF TABLET BY MOUTH TWICE DAILY   pravastatin 40 MG tablet Commonly known as: PRAVACHOL TAKE 1 TABLET BY MOUTH ONCE DAILY. MUST KEEP APPOINTMENT.       Signature:  Chesley Mires, MD [place] Pager - [phone] 07/07/2020, 10:07 AM

## 2020-07-07 NOTE — Patient Instructions (Signed)
Will arrange for auto CPAP set up  Follow up in 4 months 

## 2020-07-11 ENCOUNTER — Other Ambulatory Visit: Payer: Self-pay | Admitting: Internal Medicine

## 2020-07-18 ENCOUNTER — Other Ambulatory Visit: Payer: Self-pay | Admitting: Internal Medicine

## 2020-07-26 ENCOUNTER — Encounter: Payer: Self-pay | Admitting: Internal Medicine

## 2020-07-26 NOTE — Telephone Encounter (Signed)
Pt is going to bring urine with her tomorrow.

## 2020-07-26 NOTE — Telephone Encounter (Signed)
Ok to leave urine sample.

## 2020-07-27 ENCOUNTER — Ambulatory Visit: Payer: BC Managed Care – PPO | Admitting: Internal Medicine

## 2020-07-27 ENCOUNTER — Encounter: Payer: Self-pay | Admitting: Internal Medicine

## 2020-07-27 ENCOUNTER — Other Ambulatory Visit: Payer: Self-pay

## 2020-07-27 DIAGNOSIS — R8290 Unspecified abnormal findings in urine: Secondary | ICD-10-CM

## 2020-07-27 DIAGNOSIS — E7800 Pure hypercholesterolemia, unspecified: Secondary | ICD-10-CM

## 2020-07-27 DIAGNOSIS — R300 Dysuria: Secondary | ICD-10-CM | POA: Diagnosis not present

## 2020-07-27 DIAGNOSIS — D75839 Thrombocytosis, unspecified: Secondary | ICD-10-CM

## 2020-07-27 DIAGNOSIS — K219 Gastro-esophageal reflux disease without esophagitis: Secondary | ICD-10-CM

## 2020-07-27 DIAGNOSIS — F439 Reaction to severe stress, unspecified: Secondary | ICD-10-CM

## 2020-07-27 DIAGNOSIS — R200 Anesthesia of skin: Secondary | ICD-10-CM

## 2020-07-27 DIAGNOSIS — R202 Paresthesia of skin: Secondary | ICD-10-CM

## 2020-07-27 DIAGNOSIS — D509 Iron deficiency anemia, unspecified: Secondary | ICD-10-CM

## 2020-07-27 DIAGNOSIS — E1165 Type 2 diabetes mellitus with hyperglycemia: Secondary | ICD-10-CM | POA: Diagnosis not present

## 2020-07-27 DIAGNOSIS — I10 Essential (primary) hypertension: Secondary | ICD-10-CM

## 2020-07-27 LAB — POCT URINALYSIS DIPSTICK
Glucose, UA: NEGATIVE
Nitrite, UA: NEGATIVE
Protein, UA: POSITIVE — AB
Spec Grav, UA: 1.025 (ref 1.010–1.025)
Urobilinogen, UA: 0.2 E.U./dL
pH, UA: 5.5 (ref 5.0–8.0)

## 2020-07-27 LAB — URINALYSIS, MICROSCOPIC ONLY

## 2020-07-27 NOTE — Progress Notes (Signed)
Patient ID: ROSELLE NORTON, female   DOB: 06/10/1976, 44 y.o.   MRN: [id number]   Subjective:    Patient ID: KARIS RILLING, female    DOB: 1975-08-14, 44 y.o.   MRN: [id number]  HPI This visit occurred during the SARS-CoV-2 public health emergency.  Safety protocols were in place, including screening questions prior to the visit, additional usage of staff PPE, and extensive cleaning of exam room while observing appropriate contact time as indicated for disinfecting solutions.  Patient here for a scheduled follow up. Here to follow up regarding her blood sugar, blood pressure and cholesterol.  Also states that since 02/2020, she has had intermittent cloudy urine and occasional odor.  Intermittent flares.  Also reports that her upper legs feel numb after standing for a while.  Also reports some intermittent right facial numbness.  Had been present for a while - intermittent.  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  Has not been on this for years.  No headache reported.  Has noticed in her left eye - intermittent bright spot. No pain.  No flashes of light.  Saw ophthalmology.  Questioning - "retinal migraine"/"occular migraine".  No actual vision loss.  Using cpap.  Seeing pulmonary.  No chest pain reported.  Breathing stable.  No acid reflux reported.  Discussed diet and exercise.  Discussed sugars and recent labs - improved.    Past Medical History:  Diagnosis Date  . Atypical endometrial hyperplasia   . Diabetes mellitus ([place])   . Diarrhea   . Environmental allergies   . Generalized anxiety disorder   . GERD (gastroesophageal reflux disease)   . Hypercholesterolemia   . Hypertension   . Insomnia   . Major depressive disorder    Past Surgical History:  Procedure Laterality Date  . COLONOSCOPY    . COLONOSCOPY WITH PROPOFOL N/A 05/28/2018   Procedure: COLONOSCOPY WITH PROPOFOL;  Surgeon: Manya Silvas, MD;  Location: [facility] ENDOSCOPY;  Service: Endoscopy;   Laterality: N/A;  . ESOPHAGOGASTRODUODENOSCOPY (EGD) WITH PROPOFOL N/A 05/28/2018   Procedure: ESOPHAGOGASTRODUODENOSCOPY (EGD) WITH PROPOFOL;  Surgeon: Manya Silvas, MD;  Location: [facility] ENDOSCOPY;  Service: Endoscopy;  Laterality: N/A;  . HYSTEROSCOPY WITH D & C     and polyp removal   Family History  Problem Relation Age of Onset  . Hypertension Mother   . Diabetes Mother   . Diverticulosis Mother   . Depression Mother   . Hypercholesterolemia Father   . Hypertension Father   . Cancer Father   . Heart disease Maternal Grandfather   . Diverticulosis Paternal Grandmother   . Hyperlipidemia Paternal Grandmother   . Heart failure Paternal Grandmother   . Cancer Maternal Grandmother   . Alcoholism Paternal Uncle    Social History   Socioeconomic History  . Marital status: Single    Spouse name: Not on file  . Number of children: 2  . Years of education: Not on file  . Highest education level: High school graduate  Occupational History    Employer: [employer]  Tobacco Use  . Smoking status: Former Smoker    Quit date: 08/06/2000    Years since quitting: 20.0  . Smokeless tobacco: Never Used  Vaping Use  . Vaping Use: Never used  Substance and Sexual Activity  . Alcohol use: Yes    Alcohol/week: 0.0 standard drinks    Comment: rare  . Drug use: No  . Sexual activity: Yes    Birth control/protection:  I.U.D.  Other Topics Concern  . Not on file  Social History Narrative  . Not on file   Social Determinants of Health   Financial Resource Strain: Not on file  Food Insecurity: Not on file  Transportation Needs: Not on file  Physical Activity: Not on file  Stress: Not on file  Social Connections: Not on file    Outpatient Encounter Medications as of 07/27/2020  Medication Sig  . albuterol (VENTOLIN HFA) 108 (90 Base) MCG/ACT inhaler Inhale 2 puffs into the lungs every 6 (six) hours as needed for wheezing.  Marland Kitchen amLODipine (NORVASC) 10 MG tablet TAKE ONE-HALF  TABLET BY MOUTH TWICE DAILY  . famotidine (PEPCID) 20 MG tablet TAKE 2 TABLETS (40 MG) BY MOUTH NIGHTLY  . fluticasone (FLONASE) 50 MCG/ACT nasal spray Use 2 spray(s) in each nostril once daily  . glucose blood test strip Use as directed to check sugars BID. Dx E11.9  . Lancets MISC by Does not apply route. Use 1 lancets three times a day  . loratadine (ALAVERT) 10 MG tablet Take 10 mg by mouth daily.  Marland Kitchen losartan (COZAAR) 100 MG tablet Take 1 tablet by mouth once daily  . metFORMIN (GLUCOPHAGE) 500 MG tablet Take 2 tablets by mouth twice daily  . metoprolol succinate (TOPROL-XL) 100 MG 24 hr tablet TAKE ONE-HALF TABLET BY MOUTH TWICE DAILY  . pravastatin (PRAVACHOL) 40 MG tablet TAKE 1 TABLET BY MOUTH ONCE DAILY. MUST KEEP APPOINTMENT.   No facility-administered encounter medications on file as of 07/27/2020.    Review of Systems  Constitutional: Negative for appetite change and unexpected weight change.  HENT: Negative for congestion and sinus pressure.   Eyes: Positive for visual disturbance.  Respiratory: Negative for cough and chest tightness.        Breathing stable.   Cardiovascular: Negative for chest pain, palpitations and leg swelling.  Gastrointestinal: Negative for abdominal pain, nausea and vomiting.  Genitourinary: Negative for difficulty urinating and dysuria.  Musculoskeletal: Negative for joint swelling and myalgias.  Neurological: Negative for dizziness, light-headedness and headaches.       Numbness in thighs a outlined.    Psychiatric/Behavioral: Negative for agitation and dysphoric mood.       Objective:    Physical Exam Vitals reviewed.  Constitutional:      General: She is not in acute distress.    Appearance: Normal appearance.  HENT:     Head: Normocephalic and atraumatic.     Right Ear: External ear normal.     Left Ear: External ear normal.     Mouth/Throat:     Mouth: Oropharynx is clear and moist.  Eyes:     General: No scleral icterus.        Right eye: No discharge.        Left eye: No discharge.     Conjunctiva/sclera: Conjunctivae normal.  Neck:     Thyroid: No thyromegaly.  Cardiovascular:     Rate and Rhythm: Normal rate and regular rhythm.  Pulmonary:     Effort: No respiratory distress.     Breath sounds: Normal breath sounds. No wheezing.  Abdominal:     General: Bowel sounds are normal.     Palpations: Abdomen is soft.     Tenderness: There is no abdominal tenderness.  Musculoskeletal:        General: No swelling, tenderness or edema.     Cervical back: Neck supple. No tenderness.  Lymphadenopathy:     Cervical: No cervical adenopathy.  Skin:  Findings: No erythema or rash.  Neurological:     Mental Status: She is alert.  Psychiatric:        Mood and Affect: Mood normal.        Behavior: Behavior normal.     BP 136/78   Pulse 96   Temp 98.2 F (36.8 C) (Oral)   Resp 16   Ht '5\' 8"'  (1.727 m)   Wt 286 lb (129.7 kg)   SpO2 98%   BMI 43.49 kg/m  Wt Readings from Last 3 Encounters:  07/27/20 286 lb (129.7 kg)  07/07/20 288 lb 6.4 oz (130.8 kg)  04/07/20 289 lb 3.2 oz (131.2 kg)     Lab Results  Component Value Date   WBC 10.6 (H) 06/08/2020   HGB 12.1 06/08/2020   HCT 37.6 06/08/2020   PLT 425.0 (H) 06/08/2020   GLUCOSE 158 (H) 06/08/2020   CHOL 169 06/08/2020   TRIG 249.0 (H) 06/08/2020   HDL 38.30 (L) 06/08/2020   LDLDIRECT 101.0 06/08/2020   LDLCALC 114 (H) 12/15/2015   ALT 13 06/08/2020   AST 10 06/08/2020   NA 136 06/08/2020   K 4.4 06/08/2020   CL 101 06/08/2020   CREATININE 0.51 06/08/2020   BUN 14 06/08/2020   CO2 26 06/08/2020   TSH 3.56 11/25/2019   INR 0.93 05/05/2018   HGBA1C 7.3 (H) 06/08/2020   MICROALBUR 3.7 (H) 06/08/2020       Assessment & Plan:   Problem List Items Addressed This Visit    Hypertension    Continues on losartan, amlodipine and metoprolol.  Blood pressure recheck:  134/78.  Continue current medication regimen.  Follow pressures.  Follow  metabolic panel.       Relevant Orders   Basic metabolic panel   Hypercholesterolemia    On pravastatin.  Low cholesterol diet and exercise.  Follow lipid panel and liver function tests.        Relevant Orders   Hepatic function panel   Lipid panel   GERD (gastroesophageal reflux disease)    Upper symptoms controlled.  On prilosec.        Diabetes mellitus ([place])    On metformin.  a1c last check - improved - 7.3.  Discussed other treatment options.  Wants to hold on additional medication.  Low carb diet and exercise.  Follow met b and a1c.       Relevant Orders   Hemoglobin A1c   Thrombocytosis (HCC)    Followed by hematology.  Felt to be reactive.  Follow cbc.       Bad odor of urine    Will recheck urine to confirm no infection.       Severe obesity (BMI >= 40) (HCC)    Discussed diet, exercise and weight loss.  She is doing better.  Sugars improved.  Follow.       Iron deficiency anemia    Has seen hematology.  Follow cbc and iron studies.       Relevant Orders   CBC with Differential/Platelet   Stress    Overall appears to be handling things relatively well.  Follow.        Numbness and tingling    Numbness in upper thighs as outlined.  Also, left leg - occasionally feels like is asleep - lateral side.  Some tingling right face - intermittent for years.  Visual disturbance as outlined.  Evaluated by ophthalmology.  Was questioning occular migraines.  Discussed further w/up and evaluation.  No  headache. 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  Off diamox and has been for years.  Discussed further neurological evaluation, including MRI, neurology referral.  Agreeable to referral.        Relevant Orders   Ambulatory referral to Neurology    Other Visit Diagnoses    Dysuria       Relevant Orders   POCT urinalysis dipstick (Completed)   Urine Culture (Completed)   Urine Microscopic (Completed)       Einar Pheasant, MD

## 2020-07-29 LAB — URINE CULTURE
MICRO NUMBER:: 11348547
SPECIMEN QUALITY:: ADEQUATE

## 2020-07-30 ENCOUNTER — Encounter: Payer: Self-pay | Admitting: Internal Medicine

## 2020-07-31 ENCOUNTER — Telehealth: Payer: Self-pay | Admitting: Internal Medicine

## 2020-07-31 MED ORDER — CEFDINIR 300 MG PO CAPS: 300.0000 mg | ORAL_CAPSULE | Freq: Two times a day (BID) | ORAL | 0 refills | Status: DC

## 2020-07-31 NOTE — Telephone Encounter (Signed)
rx sent in for omnicef.  Pt notified via my chart.

## 2020-08-02 NOTE — Telephone Encounter (Signed)
Already addressed.  See other my chart message.  abx sent in for her.

## 2020-08-03 ENCOUNTER — Encounter: Payer: Self-pay | Admitting: Internal Medicine

## 2020-08-03 NOTE — Assessment & Plan Note (Signed)
a1c improved - 7.3 last check.  Discussed diet and exercise.  Declines additional medication.  Follow met b and a1c.

## 2020-08-03 NOTE — Assessment & Plan Note (Signed)
Will recheck urine to confirm no infection.

## 2020-08-03 NOTE — Assessment & Plan Note (Signed)
Discussed diet, exercise and weight loss.  She is doing better.  Sugars improved.  Follow.

## 2020-08-03 NOTE — Assessment & Plan Note (Signed)
On pravastatin.  Low cholesterol diet and exercise.  Follow lipid panel and liver function tests.   

## 2020-08-03 NOTE — Assessment & Plan Note (Signed)
On metformin.  a1c last check - improved - 7.3.  Discussed other treatment options.  Wants to hold on additional medication.  Low carb diet and exercise.  Follow met b and a1c.

## 2020-08-03 NOTE — Assessment & Plan Note (Signed)
Upper symptoms controlled.  On prilosec.

## 2020-08-03 NOTE — Assessment & Plan Note (Signed)
Continues on losartan, amlodipine and metoprolol.  Blood pressure recheck:  134/78.  Continue current medication regimen.  Follow pressures.  Follow metabolic panel.

## 2020-08-03 NOTE — Assessment & Plan Note (Signed)
Numbness in upper thighs as outlined.  Also, left leg - occasionally feels like is asleep - lateral side.  Some tingling right face - intermittent for years.  Visual disturbance as outlined.  Evaluated by ophthalmology.  Was questioning occular migraines.  Discussed further w/up and evaluation.  No headache. 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  Off diamox and has been for years.  Discussed further neurological evaluation, including MRI, neurology referral.  Agreeable to referral.

## 2020-08-03 NOTE — Assessment & Plan Note (Signed)
Overall appears to be handling things relatively well.  Follow.   

## 2020-08-03 NOTE — Assessment & Plan Note (Signed)
Followed by hematology.  Felt to be reactive.  Follow cbc.

## 2020-08-03 NOTE — Assessment & Plan Note (Signed)
Has seen hematology.  Follow cbc and iron studies.  

## 2020-08-04 ENCOUNTER — Other Ambulatory Visit: Payer: BC Managed Care – PPO

## 2020-08-04 ENCOUNTER — Ambulatory Visit: Payer: BC Managed Care – PPO | Admitting: Internal Medicine

## 2020-08-07 ENCOUNTER — Encounter: Payer: Self-pay | Admitting: Internal Medicine

## 2020-08-07 DIAGNOSIS — R928 Other abnormal and inconclusive findings on diagnostic imaging of breast: Secondary | ICD-10-CM | POA: Insufficient documentation

## 2020-08-09 ENCOUNTER — Ambulatory Visit: Payer: BC Managed Care – PPO | Admitting: Internal Medicine

## 2020-08-14 ENCOUNTER — Other Ambulatory Visit: Payer: Self-pay | Admitting: Internal Medicine

## 2020-09-01 ENCOUNTER — Telehealth: Payer: Self-pay | Admitting: Internal Medicine

## 2020-09-01 DIAGNOSIS — R928 Other abnormal and inconclusive findings on diagnostic imaging of breast: Secondary | ICD-10-CM

## 2020-09-01 NOTE — Telephone Encounter (Signed)
Please notify pt that per review, she had her mammogram in 03/2020.  Recommended f/u mammogram in 6 months.  Due 09/2020.  Does she want this at [facility].  Needs to schedule.  I have placed order for [facility].  If wants somewhere else, will need to reorder.

## 2020-09-04 ENCOUNTER — Other Ambulatory Visit: Payer: Self-pay | Admitting: Internal Medicine

## 2020-09-07 ENCOUNTER — Telehealth: Payer: Self-pay | Admitting: Internal Medicine

## 2020-09-07 NOTE — Telephone Encounter (Signed)
lft vm for pt to call ofc regarding diag mammo

## 2020-09-09 LAB — HM MAMMOGRAPHY

## 2020-09-09 NOTE — Telephone Encounter (Signed)
Left detailed message for pt 

## 2020-09-23 ENCOUNTER — Other Ambulatory Visit: Payer: Self-pay | Admitting: Internal Medicine

## 2020-09-28 ENCOUNTER — Other Ambulatory Visit: Payer: BC Managed Care – PPO

## 2020-09-30 ENCOUNTER — Ambulatory Visit: Payer: BC Managed Care – PPO | Admitting: Internal Medicine

## 2020-10-09 ENCOUNTER — Other Ambulatory Visit: Payer: Self-pay | Admitting: Internal Medicine

## 2020-10-19 ENCOUNTER — Encounter: Payer: Self-pay | Admitting: Internal Medicine

## 2020-10-24 ENCOUNTER — Encounter: Payer: Self-pay | Admitting: Internal Medicine

## 2020-10-25 NOTE — Addendum Note (Signed)
Addended by: Lars Masson on: 10/25/2020 10:09 AM   Modules accepted: Orders

## 2020-10-30 ENCOUNTER — Other Ambulatory Visit: Payer: Self-pay | Admitting: Internal Medicine

## 2020-10-31 ENCOUNTER — Encounter: Payer: Self-pay | Admitting: Internal Medicine

## 2020-10-31 DIAGNOSIS — G4733 Obstructive sleep apnea (adult) (pediatric): Secondary | ICD-10-CM

## 2020-10-31 NOTE — Telephone Encounter (Signed)
Please let her know that changing her mask would be the first thing to try to see if this improves mouth dryness.  She can also try increasing the humidity setting on her CPAP machine; this would do the same thing as getting a climate controlled tube.  Please send an order to have her DME refit her CPAP mask.  If she wants to get a climate control tube, then please send order for this also.

## 2020-10-31 NOTE — Telephone Encounter (Signed)
Dr. Sood, please advise. thanks 

## 2020-11-01 MED ORDER — TERCONAZOLE 0.4 % VA CREA: 1.0000 | TOPICAL_CREAM | Freq: Every day | VAGINAL | 0 refills | Status: DC

## 2020-11-01 NOTE — Telephone Encounter (Signed)
rx sent in for terazol.

## 2020-11-02 ENCOUNTER — Other Ambulatory Visit (INDEPENDENT_AMBULATORY_CARE_PROVIDER_SITE_OTHER): Payer: BC Managed Care – PPO

## 2020-11-02 ENCOUNTER — Other Ambulatory Visit: Payer: Self-pay

## 2020-11-02 DIAGNOSIS — I10 Essential (primary) hypertension: Secondary | ICD-10-CM

## 2020-11-02 DIAGNOSIS — R821 Myoglobinuria: Secondary | ICD-10-CM

## 2020-11-02 DIAGNOSIS — E7800 Pure hypercholesterolemia, unspecified: Secondary | ICD-10-CM | POA: Diagnosis not present

## 2020-11-02 LAB — LIPID PANEL
Cholesterol: 205 mg/dL — ABNORMAL HIGH (ref 0–200)
HDL: 44 mg/dL (ref >39.00)
NonHDL: 161.49
Total CHOL/HDL Ratio: 5
Triglycerides: 217 mg/dL — ABNORMAL HIGH (ref 0.0–149.0)
VLDL: 43.4 mg/dL — ABNORMAL HIGH (ref 0.0–40.0)

## 2020-11-02 LAB — BASIC METABOLIC PANEL
BUN: 17 mg/dL (ref 6–23)
CO2: 25 mEq/L (ref 19–32)
Calcium: 9.7 mg/dL (ref 8.4–10.5)
Chloride: 100 mEq/L (ref 96–112)
Creatinine, Ser: 0.51 mg/dL (ref 0.40–1.20)
GFR: 115.26 mL/min (ref >60.00)
Glucose, Bld: 167 mg/dL — ABNORMAL HIGH (ref 70–99)
Potassium: 4.3 mEq/L (ref 3.5–5.1)
Sodium: 137 mEq/L (ref 135–145)

## 2020-11-02 LAB — LDL CHOLESTEROL, DIRECT: Direct LDL: 132 mg/dL

## 2020-11-04 ENCOUNTER — Ambulatory Visit: Payer: BC Managed Care – PPO | Admitting: Internal Medicine

## 2020-11-04 ENCOUNTER — Encounter: Payer: Self-pay | Admitting: Internal Medicine

## 2020-11-04 ENCOUNTER — Other Ambulatory Visit: Payer: Self-pay

## 2020-11-04 DIAGNOSIS — F439 Reaction to severe stress, unspecified: Secondary | ICD-10-CM | POA: Diagnosis not present

## 2020-11-04 DIAGNOSIS — D75839 Thrombocytosis, unspecified: Secondary | ICD-10-CM | POA: Diagnosis not present

## 2020-11-04 DIAGNOSIS — I10 Essential (primary) hypertension: Secondary | ICD-10-CM

## 2020-11-04 DIAGNOSIS — E1165 Type 2 diabetes mellitus with hyperglycemia: Secondary | ICD-10-CM | POA: Diagnosis not present

## 2020-11-04 DIAGNOSIS — E559 Vitamin D deficiency, unspecified: Secondary | ICD-10-CM

## 2020-11-04 DIAGNOSIS — E7800 Pure hypercholesterolemia, unspecified: Secondary | ICD-10-CM

## 2020-11-04 DIAGNOSIS — N920 Excessive and frequent menstruation with regular cycle: Secondary | ICD-10-CM

## 2020-11-04 DIAGNOSIS — D509 Iron deficiency anemia, unspecified: Secondary | ICD-10-CM

## 2020-11-04 DIAGNOSIS — N898 Other specified noninflammatory disorders of vagina: Secondary | ICD-10-CM

## 2020-11-04 DIAGNOSIS — R928 Other abnormal and inconclusive findings on diagnostic imaging of breast: Secondary | ICD-10-CM

## 2020-11-04 NOTE — Patient Instructions (Signed)
Start vitamin D3 1000 units per day  Vitamin B12 1015mcg per day

## 2020-11-04 NOTE — Progress Notes (Signed)
Patient ID: Lindsey Strickland, female   DOB: 1976/05/17, 45 y.o.   MRN: [id number]   Subjective:    Patient ID: Lindsey Strickland, female    DOB: October 05, 1975, 45 y.o.   MRN: [id number]  HPI This visit occurred during the SARS-CoV-2 public health emergency.  Safety protocols were in place, including screening questions prior to the visit, additional usage of staff PPE, and extensive cleaning of exam room while observing appropriate contact time as indicated for disinfecting solutions.  Patient here for a scheduled follow up.  Here to follow up regarding her blood sugar, blood pressure and cholesterol.  Just recently had abnormal mammogram.  S/p biopsy.  Pathology - stromal fibrosis and unremarkable lobular units.  Recommended f/u annual screening mammography.  Discussed recent labs.  Neurology recommended starting her on vitamin D and b12.  She has questions about starting these supplements.  Also discussed elevated a1c and cholesterol and desire to treat - medications.  Discussed diet and exercise.  She declines medication.  No chest pain or sob reported.  Has not started magnesium for headaches. Stable.  No increased abdominal pain reported.  Is having persistent problems with vaginal irritation.  Discussed.  Also discussed treatment.  Vaginal bleeding - s/p biopsy 03/2020.  She is noticing heavier bleeding now - since biopsy.   Period q 2-2.5 weeks.  Does feel has been a little better the last couple of periods.   Past Medical History:  Diagnosis Date  . Atypical endometrial hyperplasia   . Diabetes mellitus ([place])   . Diarrhea   . Environmental allergies   . Generalized anxiety disorder   . GERD (gastroesophageal reflux disease)   . Hypercholesterolemia   . Hypertension   . Insomnia   . Major depressive disorder    Past Surgical History:  Procedure Laterality Date  . COLONOSCOPY    . COLONOSCOPY WITH PROPOFOL N/A 05/28/2018   Procedure: COLONOSCOPY WITH PROPOFOL;  Surgeon: Manya Silvas,  MD;  Location: [facility] ENDOSCOPY;  Service: Endoscopy;  Laterality: N/A;  . ESOPHAGOGASTRODUODENOSCOPY (EGD) WITH PROPOFOL N/A 05/28/2018   Procedure: ESOPHAGOGASTRODUODENOSCOPY (EGD) WITH PROPOFOL;  Surgeon: Manya Silvas, MD;  Location: [facility] ENDOSCOPY;  Service: Endoscopy;  Laterality: N/A;  . HYSTEROSCOPY WITH D & C     and polyp removal   Family History  Problem Relation Age of Onset  . Hypertension Mother   . Diabetes Mother   . Diverticulosis Mother   . Depression Mother   . Hypercholesterolemia Father   . Hypertension Father   . Cancer Father   . Heart disease Maternal Grandfather   . Diverticulosis Paternal Grandmother   . Hyperlipidemia Paternal Grandmother   . Heart failure Paternal Grandmother   . Cancer Maternal Grandmother   . Alcoholism Paternal Uncle    Social History   Socioeconomic History  . Marital status: Single    Spouse name: Not on file  . Number of children: 2  . Years of education: Not on file  . Highest education level: High school graduate  Occupational History    Employer: [employer]  Tobacco Use  . Smoking status: Former Smoker    Quit date: 08/06/2000    Years since quitting: 20.2  . Smokeless tobacco: Never Used  Vaping Use  . Vaping Use: Never used  Substance and Sexual Activity  . Alcohol use: Yes    Alcohol/week: 0.0 standard drinks    Comment: rare  . Drug use: No  . Sexual activity: Yes  Birth control/protection: I.U.D.  Other Topics Concern  . Not on file  Social History Narrative  . Not on file   Social Determinants of Health   Financial Resource Strain: Not on file  Food Insecurity: Not on file  Transportation Needs: Not on file  Physical Activity: Not on file  Stress: Not on file  Social Connections: Not on file    Outpatient Encounter Medications as of 11/04/2020  Medication Sig  . albuterol (VENTOLIN HFA) 108 (90 Base) MCG/ACT inhaler Inhale 2 puffs into the lungs every 6 (six) hours as needed for wheezing.   Marland Kitchen amLODipine (NORVASC) 10 MG tablet Take 1/2 (one-half) tablet by mouth twice daily  . cefdinir (OMNICEF) 300 MG capsule Take 1 capsule (300 mg total) by mouth 2 (two) times daily.  . famotidine (PEPCID) 20 MG tablet TAKE 2 TABLETS (40 MG) BY MOUTH NIGHTLY  . fluticasone (FLONASE) 50 MCG/ACT nasal spray Use 2 spray(s) in each nostril once daily  . glucose blood test strip Use as directed to check sugars BID. Dx E11.9  . Lancets MISC by Does not apply route. Use 1 lancets three times a day  . loratadine (ALAVERT) 10 MG tablet Take 10 mg by mouth daily.  Marland Kitchen losartan (COZAAR) 100 MG tablet Take 1 tablet by mouth once daily  . metFORMIN (GLUCOPHAGE) 500 MG tablet Take 2 tablets by mouth twice daily  . metoprolol succinate (TOPROL-XL) 100 MG 24 hr tablet Take 1/2 (one-half) tablet by mouth twice daily  . pravastatin (PRAVACHOL) 40 MG tablet TAKE 1 TABLET BY MOUTH ONCE DAILY MUST  KEEP  APPOINTMENT  . terconazole (TERAZOL 7) 0.4 % vaginal cream Place 1 applicator vaginally at bedtime.   No facility-administered encounter medications on file as of 11/04/2020.    Review of Systems  Constitutional: Negative for appetite change and unexpected weight change.  HENT: Negative for congestion and sinus pressure.   Respiratory: Negative for cough, chest tightness and shortness of breath.   Cardiovascular: Negative for chest pain, palpitations and leg swelling.  Gastrointestinal: Negative for abdominal pain, diarrhea, nausea and vomiting.  Genitourinary: Negative for difficulty urinating and dysuria.       Vaginal irritation.  Heavy periods as outlined.   Musculoskeletal: Negative for joint swelling and myalgias.  Skin: Negative for color change and rash.  Neurological: Negative for dizziness and light-headedness.       Seeing neurology for headaches.   Psychiatric/Behavioral: Negative for agitation and dysphoric mood.       Objective:    Physical Exam  BP 132/78   Pulse 88   Temp 97.7 F (36.5  C) (Oral)   Resp 16   Ht _0  (1.727 m)   Wt 291 lb (132 kg)   SpO2 98%   BMI 44.25 kg/m  Wt Readings from Last 3 Encounters:  11/04/20 291 lb (132 kg)  07/27/20 286 lb (129.7 kg)  07/07/20 288 lb 6.4 oz (130.8 kg)     Lab Results  Component Value Date   WBC 10.6 (H) 06/08/2020   HGB 12.1 06/08/2020   HCT 37.6 06/08/2020   PLT 425.0 (H) 06/08/2020   GLUCOSE 167 (H) 11/02/2020   CHOL 205 (H) 11/02/2020   TRIG 217.0 (H) 11/02/2020   HDL 44.00 11/02/2020   LDLDIRECT 132.0 11/02/2020   LDLCALC 114 (H) 12/15/2015   ALT 13 06/08/2020   AST 10 06/08/2020   NA 137 11/02/2020   K 4.3 11/02/2020   CL 100 11/02/2020   CREATININE 0.51 11/02/2020  BUN 17 11/02/2020   CO2 25 11/02/2020   TSH 3.56 11/25/2019   INR 0.93 05/05/2018   HGBA1C 7.3 (H) 06/08/2020   MICROALBUR 3.7 (H) 06/08/2020       Assessment & Plan:   Problem List Items Addressed This Visit    Abnormal mammogram    S/p biopsy as outlined.  Recommended continuing annual screening mammography.       Hypercholesterolemia    Continue pravastatin. Discussed changing/increasing medication. She declines.  Low cholesterol diet and exercise.  Follow lipid panel and liver function tests.        Relevant Orders   Hepatic function panel   Lipid panel   Hypertension    Continue losartan, metoprolol and amlodipine.  Blood pressures as outlined. Hold on changing medication.  Follow pressures.  Follow metabolic panel.       Relevant Orders   Basic metabolic panel   Iron deficiency anemia    Has seen hematology.  Follow cbc and iron studies.       Relevant Orders   CBC with Differential/Platelet   IBC + Ferritin   Menorrhagia    Increased bleeding as outlined.  S/p biopsy 03/2020.  Discussed the need for f/u with gyn. Plans to f/u.       Severe obesity (BMI >= 40) (HCC)    Discussed diet, exercise and weight loss.  Follow.       Stress    Overall appears to be handling things relatively well.  Follow.        Thrombocytosis ([place])    Has been evaluated by hematology. Felt to be reactive.  Follow cbc.       Type 2 diabetes mellitus with hyperglycemia (HCC)    Sugars remain elevated.  Elevated a1c.  Discussed diet and exercise.  Discussed starting medication.  She declines.  Follow met b and a1c.       Relevant Orders   Hemoglobin A1c   Vaginal irritation    Tried monistat.  terazol as directed.  Follow.       Vitamin D deficiency    Declines to take prescription vitamin D.  Agreed to take otc vitamin D3 1000 units per day.  Follow.           Einar Pheasant, MD

## 2020-11-06 ENCOUNTER — Encounter: Payer: Self-pay | Admitting: Internal Medicine

## 2020-11-06 DIAGNOSIS — N898 Other specified noninflammatory disorders of vagina: Secondary | ICD-10-CM | POA: Insufficient documentation

## 2020-11-06 DIAGNOSIS — E559 Vitamin D deficiency, unspecified: Secondary | ICD-10-CM | POA: Insufficient documentation

## 2020-11-06 NOTE — Assessment & Plan Note (Signed)
Discussed diet, exercise and weight loss.  Follow.    

## 2020-11-06 NOTE — Assessment & Plan Note (Signed)
Sugars remain elevated.  Elevated a1c.  Discussed diet and exercise.  Discussed starting medication.  She declines.  Follow met b and a1c.

## 2020-11-06 NOTE — Assessment & Plan Note (Signed)
Increased bleeding as outlined.  S/p biopsy 03/2020.  Discussed the need for f/u with gyn. Plans to f/u.

## 2020-11-06 NOTE — Assessment & Plan Note (Signed)
S/p biopsy as outlined.  Recommended continuing annual screening mammography.

## 2020-11-06 NOTE — Assessment & Plan Note (Signed)
Tried monistat.  terazol as directed.  Follow.

## 2020-11-06 NOTE — Assessment & Plan Note (Signed)
Overall appears to be handling things relatively well.  Follow.   

## 2020-11-06 NOTE — Assessment & Plan Note (Signed)
Has seen hematology.  Follow cbc and iron studies.  

## 2020-11-06 NOTE — Assessment & Plan Note (Signed)
Declines to take prescription vitamin D.  Agreed to take otc vitamin D3 1000 units per day.  Follow.

## 2020-11-06 NOTE — Assessment & Plan Note (Signed)
Continue pravastatin. Discussed changing/increasing medication. She declines.  Low cholesterol diet and exercise.  Follow lipid panel and liver function tests.

## 2020-11-06 NOTE — Assessment & Plan Note (Signed)
Has been evaluated by hematology.  Felt to be reactive.  Follow cbc.  

## 2020-11-06 NOTE — Assessment & Plan Note (Signed)
Continue losartan, metoprolol and amlodipine.  Blood pressures as outlined. Hold on changing medication.  Follow pressures.  Follow metabolic panel.

## 2020-11-13 ENCOUNTER — Other Ambulatory Visit: Payer: Self-pay | Admitting: Internal Medicine

## 2020-12-04 ENCOUNTER — Other Ambulatory Visit: Payer: Self-pay | Admitting: Internal Medicine

## 2020-12-16 ENCOUNTER — Other Ambulatory Visit: Payer: Self-pay | Admitting: Internal Medicine

## 2021-01-04 ENCOUNTER — Telehealth: Payer: Self-pay

## 2021-01-04 NOTE — Telephone Encounter (Signed)
LVM in regards to CPAP machine, will try calling pt again at a later time.

## 2021-01-05 ENCOUNTER — Other Ambulatory Visit: Payer: Self-pay

## 2021-01-05 ENCOUNTER — Ambulatory Visit: Payer: BC Managed Care – PPO | Admitting: Pulmonary Disease

## 2021-01-05 ENCOUNTER — Encounter: Payer: Self-pay | Admitting: Pulmonary Disease

## 2021-01-05 VITALS — BP 140/62 | HR 83 | Temp 97.3°F | Ht 68.0 in | Wt 287.8 lb

## 2021-01-05 DIAGNOSIS — G4733 Obstructive sleep apnea (adult) (pediatric): Secondary | ICD-10-CM

## 2021-01-05 DIAGNOSIS — G4730 Sleep apnea, unspecified: Secondary | ICD-10-CM

## 2021-01-05 DIAGNOSIS — G43109 Migraine with aura, not intractable, without status migrainosus: Secondary | ICD-10-CM | POA: Diagnosis not present

## 2021-01-05 DIAGNOSIS — F458 Other somatoform disorders: Secondary | ICD-10-CM

## 2021-01-05 DIAGNOSIS — E669 Obesity, unspecified: Secondary | ICD-10-CM

## 2021-01-05 NOTE — Patient Instructions (Signed)
Follow up in 1 year.

## 2021-01-05 NOTE — Progress Notes (Signed)
[facility]  Chief Complaint  Patient presents with  . Follow-up    OSA- everything well,just CPAP irritating nose.     Constitutional:  BP 140/62 (BP Location: Left Arm, Patient Position: Sitting, Cuff Size: Normal)   Pulse 83   Temp (!) 97.3 F (36.3 C) (Temporal)   Ht 5\' 8"  (1.727 m)   Wt 287 lb 12.8 oz (130.5 kg)   SpO2 97%   BMI 43.76 kg/m   Past Medical History:  Endometrial hyperplasia, DM, Allergies, Anxiety, GERD, HLD, HTN, Depression  Past Surgical History:  Her  has a past surgical history that includes Colonoscopy; Hysteroscopy with D & C; Colonoscopy with propofol (N/A, 05/28/2018); and Esophagogastroduodenoscopy (egd) with propofol (N/A, 05/28/2018).  Brief Summary:  Lindsey Strickland is a 45 y.o. female former smoker with obstructive sleep apnea.      Subjective:   She is doing well with CPAP.  Feels like she is sleeping better.  Using full face mask.  Her mask causes irritation over bridge of her nose.  She is no longer needing to use mouth guard.  She still gets teeth clenching sometimes during the day when she feels more stress.  She was getting ocular migraines, but these have improved since starting CPAP.  Physical Exam:   Appearance - well kempt   ENMT - no sinus tenderness, no oral exudate, no LAN, Mallampati 4 airway, no stridor  Respiratory - equal breath sounds bilaterally, no wheezing or rales  CV - s1s2 regular rate and rhythm, no murmurs  Ext - no clubbing, no edema  Skin - no rashes  Psych - normal mood and affect   Sleep Tests:   HST 05/01/20 >> AHI 6.6, SpO2 low 88%  Auto CPAP 12/06/20 to 01/04/21 >> used on 30 of 30 nights with average 8 hrs 19 min.  Average AHI 0.1 with median CPAP 7 and 95 th percentile CPAP 10 cm H2O.  Social History:  She  reports that she quit smoking about 20 years ago. She smoked 1.00 pack per day. She has never used smokeless tobacco. She reports current  alcohol use. She reports that she does not use drugs.  Family History:  Her family history includes Alcoholism in her paternal uncle; Cancer in her father and maternal grandmother; Depression in her mother; Diabetes in her mother; Diverticulosis in her mother and paternal grandmother; Heart disease in her maternal grandfather; Heart failure in her paternal grandmother; Hypercholesterolemia in her father; Hyperlipidemia in her paternal grandmother; Hypertension in her father and mother.     Assessment/Plan:   Obstructive sleep apnea. - she is compliant with CPAP and reports benefit from therapy - she uses [employer] for her DME - continue auto CPAP 5 to 20 cm H2O - advised her to try using silicone mask liner to help reduce irritation over her nose - she can also review alternative mask options on line  Obesity. - she is to continue her weight loss efforts  Bruxism. - improved since starting CPAP - recurrent episodes seem related to stress; discussed techniques to help reduce stress  Ocular migraines. - discussed how sleep disruption and low oxygen at night could contribute to this   Time Spent Involved in Patient Care on Day of Examination:  21 minutes  Follow up:  Patient Instructions  Follow up in 1 year  Medication List:   Allergies as of 01/05/2021      Reactions   Tetracycline Other (See Comments)  Other reaction(s): Unknown   Augmentin [amoxicillin-pot Clavulanate]    GI intolerance   Hctz [hydrochlorothiazide]    Lisinopril    Other reaction(s): Other (See Comments) cough   Other    Mycins   Prednisone    Chest pain   Ciprofloxacin Nausea Only, Palpitations      Medication List       Accurate as of January 05, 2021 10:13 AM. If you have any questions, ask your nurse or doctor.        albuterol 108 (90 Base) MCG/ACT inhaler Commonly known as: VENTOLIN HFA Inhale 2 puffs into the lungs every 6 (six) hours as needed for wheezing.   amLODipine 10 MG  tablet Commonly known as: NORVASC Take 1/2 (one-half) tablet by mouth twice daily   cefdinir 300 MG capsule Commonly known as: OMNICEF Take 1 capsule (300 mg total) by mouth 2 (two) times daily.   famotidine 20 MG tablet Commonly known as: PEPCID TAKE 2 TABLETS (40 MG) BY MOUTH NIGHTLY   fluticasone 50 MCG/ACT nasal spray Commonly known as: FLONASE Use 2 spray(s) in each nostril once daily   glucose blood test strip Use as directed to check sugars BID. Dx E11.9   Lancets Misc by Does not apply route. Use 1 lancets three times a day   loratadine 10 MG tablet Commonly known as: CLARITIN Take 10 mg by mouth daily.   losartan 100 MG tablet Commonly known as: COZAAR Take 1 tablet by mouth once daily   metFORMIN 500 MG tablet Commonly known as: GLUCOPHAGE Take 2 tablets by mouth twice daily   metoprolol succinate 100 MG 24 hr tablet Commonly known as: TOPROL-XL Take 1/2 (one-half) tablet by mouth twice daily   pravastatin 40 MG tablet Commonly known as: PRAVACHOL TAKE 1 TABLET BY MOUTH ONCE DAILY *MUST KEEP APPOINTMENT*   terconazole 0.4 % vaginal cream Commonly known as: Terazol 7 Place 1 applicator vaginally at bedtime.       Signature:  Chesley Mires, MD [place] Pager - [phone] 01/05/2021, 10:13 AM

## 2021-01-09 ENCOUNTER — Other Ambulatory Visit: Payer: Self-pay | Admitting: Internal Medicine

## 2021-01-22 ENCOUNTER — Other Ambulatory Visit: Payer: Self-pay | Admitting: Internal Medicine

## 2021-02-07 ENCOUNTER — Other Ambulatory Visit: Payer: BC Managed Care – PPO

## 2021-02-09 ENCOUNTER — Ambulatory Visit: Payer: BC Managed Care – PPO | Admitting: Internal Medicine

## 2021-02-13 ENCOUNTER — Other Ambulatory Visit: Payer: Self-pay | Admitting: Internal Medicine

## 2021-02-14 ENCOUNTER — Other Ambulatory Visit: Payer: BC Managed Care – PPO

## 2021-02-16 ENCOUNTER — Ambulatory Visit: Payer: BC Managed Care – PPO | Admitting: Internal Medicine

## 2021-02-27 ENCOUNTER — Other Ambulatory Visit: Payer: Self-pay | Admitting: Internal Medicine

## 2021-02-28 ENCOUNTER — Ambulatory Visit: Payer: BC Managed Care – PPO | Admitting: Internal Medicine

## 2021-02-28 ENCOUNTER — Encounter: Payer: Self-pay | Admitting: Internal Medicine

## 2021-02-28 ENCOUNTER — Other Ambulatory Visit: Payer: Self-pay

## 2021-02-28 VITALS — BP 142/86 | HR 94 | Temp 97.7°F | Ht 67.99 in | Wt 289.0 lb

## 2021-02-28 DIAGNOSIS — F439 Reaction to severe stress, unspecified: Secondary | ICD-10-CM

## 2021-02-28 DIAGNOSIS — R8290 Unspecified abnormal findings in urine: Secondary | ICD-10-CM

## 2021-02-28 DIAGNOSIS — Z114 Encounter for screening for human immunodeficiency virus [HIV]: Secondary | ICD-10-CM

## 2021-02-28 DIAGNOSIS — K219 Gastro-esophageal reflux disease without esophagitis: Secondary | ICD-10-CM

## 2021-02-28 DIAGNOSIS — D509 Iron deficiency anemia, unspecified: Secondary | ICD-10-CM

## 2021-02-28 DIAGNOSIS — Z1159 Encounter for screening for other viral diseases: Secondary | ICD-10-CM | POA: Diagnosis not present

## 2021-02-28 DIAGNOSIS — D75839 Thrombocytosis, unspecified: Secondary | ICD-10-CM

## 2021-02-28 DIAGNOSIS — I10 Essential (primary) hypertension: Secondary | ICD-10-CM | POA: Diagnosis not present

## 2021-02-28 DIAGNOSIS — E7800 Pure hypercholesterolemia, unspecified: Secondary | ICD-10-CM

## 2021-02-28 DIAGNOSIS — E1165 Type 2 diabetes mellitus with hyperglycemia: Secondary | ICD-10-CM

## 2021-02-28 LAB — HM DIABETES FOOT EXAM

## 2021-02-28 NOTE — Progress Notes (Signed)
Patient ID: Lindsey Strickland, female   DOB: 10/21/1975, 45 y.o.   MRN: [id number]   Subjective:    Patient ID: Lindsey Strickland, female    DOB: [birth year], 45 y.o.   MRN: [id number]  HPI This visit occurred during the SARS-CoV-2 public health emergency.  Safety protocols were in place, including screening questions prior to the visit, additional usage of staff PPE, and extensive cleaning of exam room while observing appropriate contact time as indicated for disinfecting solutions.   Patient here for a scheduled follow up. Here to follow up regarding her blood sugar, blood pressure and cholesterol.  Increased stress.  Discussed.  She is seeing a therapist regularly.  No suicidal thoughts currently, but states she has previously felt things would be easier - if not here.  No thoughts like this now.  Discussed the need to f/u with her therapist.  She agreed to call today.  No chest pain or sob reported.  No abdominal pain reported.  Saw pulmonary.  Diagnosed with OSA.  Recommended CPAP.  Recent breast evaluation.  Reports everything checked out fine.  Menstrual cycle is "some better". Due to f/u with gyn in August.     Past Medical History:  Diagnosis Date   Atypical endometrial hyperplasia    Diabetes mellitus ([place])    Diarrhea    Environmental allergies    Generalized anxiety disorder    GERD (gastroesophageal reflux disease)    Hypercholesterolemia    Hypertension    Insomnia    Major depressive disorder    Past Surgical History:  Procedure Laterality Date   COLONOSCOPY     COLONOSCOPY WITH PROPOFOL N/A 05/28/2018   Procedure: COLONOSCOPY WITH PROPOFOL;  Surgeon: Manya Silvas, MD;  Location[facility] ENDOSCOPY;  Service: Endoscopy;  Laterality: N/A;   ESOPHAGOGASTRODUODENOSCOPY (EGD) WITH PROPOFOL N/A 05/28/2018   Procedure: ESOPHAGOGASTRODUODENOSCOPY (EGD) WITH PROPOFOL;  Surgeon: Manya Silvas, MD;  Location[facility] ENDOSCOPY;  Service: Endoscopy;  Laterality: N/A;   HYSTEROSCOPY  WITH D & C     and polyp removal   Family History  Problem Relation Age of Onset   Hypertension Mother    Diabetes Mother    Diverticulosis Mother    Depression Mother    Hypercholesterolemia Father    Hypertension Father    Cancer Father    Heart disease Maternal Grandfather    Diverticulosis Paternal Grandmother    Hyperlipidemia Paternal Grandmother    Heart failure Paternal Grandmother    Cancer Maternal Grandmother    Alcoholism Paternal Uncle    Social History   Socioeconomic History   Marital status: Single    Spouse name: Not on file   Number of children: 2   Years of education: Not on file   Highest education level: High school graduate  Occupational History    Employer: [employer]  Tobacco Use   Smoking status: Former    Packs/day: 1.00    Types: Cigarettes    Quit date: 08/06/2000    Years since quitting: 20.5   Smokeless tobacco: Never  Vaping Use   Vaping Use: Never used  Substance and Sexual Activity   Alcohol use: Yes    Alcohol/week: 0.0 standard drinks    Comment: rare   Drug use: No   Sexual activity: Yes    Birth control/protection: I.U.D.  Other Topics Concern   Not on file  Social History Narrative   Not on file   Social Determinants of Health   Financial Resource  Strain: Not on file  Food Insecurity: Not on file  Transportation Needs: Not on file  Physical Activity: Not on file  Stress: Not on file  Social Connections: Not on file    Review of Systems  Constitutional:  Negative for appetite change and unexpected weight change.  HENT:  Negative for congestion and sinus pressure.   Respiratory:  Negative for cough, chest tightness and shortness of breath.   Cardiovascular:  Negative for chest pain, palpitations and leg swelling.  Gastrointestinal:  Negative for abdominal pain, diarrhea, nausea and vomiting.  Genitourinary:  Negative for difficulty urinating and dysuria.  Musculoskeletal:  Negative for joint swelling and  myalgias.  Skin:  Negative for color change and rash.  Neurological:  Negative for dizziness, light-headedness and headaches.  Psychiatric/Behavioral:  Negative for agitation and dysphoric mood.       Objective:    Physical Exam Vitals reviewed.  Constitutional:      General: She is not in acute distress.    Appearance: Normal appearance.  HENT:     Head: Normocephalic and atraumatic.     Right Ear: External ear normal.     Left Ear: External ear normal.  Eyes:     General: No scleral icterus.       Right eye: No discharge.        Left eye: No discharge.     Conjunctiva/sclera: Conjunctivae normal.  Neck:     Thyroid: No thyromegaly.  Cardiovascular:     Rate and Rhythm: Normal rate and regular rhythm.  Pulmonary:     Effort: No respiratory distress.     Breath sounds: Normal breath sounds. No wheezing.  Abdominal:     General: Bowel sounds are normal.     Palpations: Abdomen is soft.     Tenderness: There is no abdominal tenderness.  Musculoskeletal:        General: No swelling or tenderness.     Cervical back: Neck supple. No tenderness.  Lymphadenopathy:     Cervical: No cervical adenopathy.  Skin:    Findings: No erythema or rash.  Neurological:     Mental Status: She is alert.  Psychiatric:        Mood and Affect: Mood normal.        Behavior: Behavior normal.    BP (!) 142/86   Pulse 94   Temp 97.7 F (36.5 C)   Ht 5' 7.99" (1.727 m)   Wt 289 lb (131.1 kg)   LMP 02/03/2021   SpO2 99%   BMI 43.95 kg/m  Wt Readings from Last 3 Encounters:  02/28/21 289 lb (131.1 kg)  01/05/21 287 lb 12.8 oz (130.5 kg)  11/04/20 291 lb (132 kg)    Outpatient Encounter Medications as of 02/28/2021  Medication Sig   albuterol (VENTOLIN HFA) 108 (90 Base) MCG/ACT inhaler Inhale 2 puffs into the lungs every 6 (six) hours as needed for wheezing.   amLODipine (NORVASC) 10 MG tablet Take 1/2 (one-half) tablet by mouth twice daily   famotidine (PEPCID) 20 MG tablet TAKE 2  TABLETS (40 MG) BY MOUTH NIGHTLY   fluticasone (FLONASE) 50 MCG/ACT nasal spray Use 2 spray(s) in each nostril once daily   glucose blood test strip Use as directed to check sugars BID. Dx E11.9   Lancets MISC by Does not apply route. Use 1 lancets three times a day   loratadine (CLARITIN) 10 MG tablet Take 10 mg by mouth daily.   losartan (COZAAR) 100 MG tablet Take 1  tablet by mouth once daily   metFORMIN (GLUCOPHAGE) 500 MG tablet Take 2 tablets by mouth twice daily   metoprolol succinate (TOPROL-XL) 100 MG 24 hr tablet TAKE ONE-HALF TABLET BY MOUTH TWICE DAILY   pravastatin (PRAVACHOL) 40 MG tablet TAKE 1 TABLET BY MOUTH ONCE DAILY. MUST KEEP APPOINTMENT.   terconazole (TERAZOL 7) 0.4 % vaginal cream Place 1 applicator vaginally at bedtime.   [DISCONTINUED] cefdinir (OMNICEF) 300 MG capsule Take 1 capsule (300 mg total) by mouth 2 (two) times daily.   No facility-administered encounter medications on file as of 02/28/2021.     Lab Results  Component Value Date   WBC 10.6 (H) 06/08/2020   HGB 12.1 06/08/2020   HCT 37.6 06/08/2020   PLT 425.0 (H) 06/08/2020   GLUCOSE 167 (H) 11/02/2020   CHOL 205 (H) 11/02/2020   TRIG 217.0 (H) 11/02/2020   HDL 44.00 11/02/2020   LDLDIRECT 132.0 11/02/2020   LDLCALC 114 (H) 12/15/2015   ALT 13 06/08/2020   AST 10 06/08/2020   NA 137 11/02/2020   K 4.3 11/02/2020   CL 100 11/02/2020   CREATININE 0.51 11/02/2020   BUN 17 11/02/2020   CO2 25 11/02/2020   TSH 3.56 11/25/2019   INR 0.93 05/05/2018   HGBA1C 7.3 (H) 06/08/2020   MICROALBUR 3.7 (H) 06/08/2020       Assessment & Plan:   Problem List Items Addressed This Visit     Bad odor of urine - Primary   Relevant Orders   Urine Culture   GERD (gastroesophageal reflux disease)    Upper symptoms controlled.  On prilosec.         Hypercholesterolemia    Continue pravastatin.  She has declined to change or adjust medication. Wants to work on diet and exercise.         Hypertension     Continue losartan, metoprolol and amlodipine.  Blood pressures as outlined. Persistent elevation. Discussed changing/adding blood pressure medication.  She desires to hold on additional medication.   Follow pressures.  Follow metabolic panel.        Iron deficiency anemia    Has seen hematology.  Follow cbc and iron studies.         Severe obesity (BMI >= 40) (HCC)    Discussed diet, exercise and weight loss.  Follow.        Stress    Increased stress.  Discussed.  Denies any suicidal ideations currently as outlined.  Sees a counselor regularly. Assures me she will call today.  Follow.  Information sent for acute - phone numbers.         Thrombocytosis ([place])    Has been evaluated by hematology.  Felt to be reactive.  Follow cbc.        Type 2 diabetes mellitus with hyperglycemia ([place])    Overdue labs.  She has eaten this am.  Discussed low carb diet and exercise.  She has been hesitant to add further medication.  Follow met b and a1c.         Other Visit Diagnoses     Need for hepatitis C screening test       Relevant Orders   Hepatitis C antibody   Screening for HIV without presence of risk factors       Relevant Orders   HIV antibody (with reflex)        Einar Pheasant, MD

## 2021-03-03 ENCOUNTER — Other Ambulatory Visit: Payer: Self-pay

## 2021-03-03 ENCOUNTER — Ambulatory Visit: Payer: BC Managed Care – PPO | Attending: Internal Medicine

## 2021-03-03 ENCOUNTER — Encounter: Payer: Self-pay | Admitting: Urgent Care

## 2021-03-03 DIAGNOSIS — Z23 Encounter for immunization: Secondary | ICD-10-CM

## 2021-03-03 MED ORDER — PFIZER-BIONT COVID-19 VAC-TRIS 30 MCG/0.3ML IM SUSP
INTRAMUSCULAR | 0 refills | Status: DC
  Filled 2021-03-03: qty 0.3, 1d supply, fill #0

## 2021-03-03 NOTE — Progress Notes (Signed)
   Covid-19 Vaccination Clinic  Name:  Lindsey Strickland    MRN: [id number] DOB: 10/15/1975  03/03/2021  Ms. Lindsey Strickland was observed post Covid-19 immunization for 30 minutes based on pre-vaccination screening without incident. She was provided with Vaccine Information Sheet and instruction to access the V-Safe system.   Ms. Lindsey Strickland was instructed to call 911 with any severe reactions post vaccine: Difficulty breathing  Swelling of face and throat  A fast heartbeat  A bad rash all over body  Dizziness and weakness   Immunizations Administered     Name Date Dose VIS Date Route   PFIZER Comrnaty(Gray TOP) Covid-19 Vaccine 03/03/2021 10:08 AM 0.3 mL 07/14/2020 Intramuscular   Manufacturer: [employer]   Lot: [id number]   [place]: [place], PharmD, MBA Clinical Acute Care Pharmacist

## 2021-03-06 ENCOUNTER — Encounter: Payer: Self-pay | Admitting: Internal Medicine

## 2021-03-06 NOTE — Assessment & Plan Note (Signed)
Discussed diet, exercise and weight loss.  Follow.    

## 2021-03-06 NOTE — Assessment & Plan Note (Signed)
Continue pravastatin.  She has declined to change or adjust medication. Wants to work on diet and exercise.

## 2021-03-06 NOTE — Assessment & Plan Note (Signed)
Increased stress.  Discussed.  Denies any suicidal ideations currently as outlined.  Sees a counselor regularly. Assures me she will call today.  Follow.  Information sent for acute - phone numbers.

## 2021-03-06 NOTE — Assessment & Plan Note (Signed)
Continue losartan, metoprolol and amlodipine.  Blood pressures as outlined. Persistent elevation. Discussed changing/adding blood pressure medication.  She desires to hold on additional medication.   Follow pressures.  Follow metabolic panel.

## 2021-03-06 NOTE — Assessment & Plan Note (Signed)
Overdue labs.  She has eaten this am.  Discussed low carb diet and exercise.  She has been hesitant to add further medication.  Follow met b and a1c.

## 2021-03-06 NOTE — Assessment & Plan Note (Signed)
Has been evaluated by hematology.  Felt to be reactive.  Follow cbc.  

## 2021-03-06 NOTE — Assessment & Plan Note (Signed)
Upper symptoms controlled.  On prilosec.

## 2021-03-06 NOTE — Assessment & Plan Note (Signed)
Has seen hematology.  Follow cbc and iron studies.  

## 2021-03-10 ENCOUNTER — Other Ambulatory Visit: Payer: BC Managed Care – PPO

## 2021-03-20 ENCOUNTER — Other Ambulatory Visit: Payer: Self-pay | Admitting: Internal Medicine

## 2021-04-09 ENCOUNTER — Other Ambulatory Visit: Payer: Self-pay | Admitting: Internal Medicine

## 2021-04-23 ENCOUNTER — Other Ambulatory Visit: Payer: Self-pay | Admitting: Internal Medicine

## 2021-04-24 ENCOUNTER — Encounter: Payer: Self-pay | Admitting: Internal Medicine

## 2021-04-26 ENCOUNTER — Telehealth: Payer: BC Managed Care – PPO | Admitting: Internal Medicine

## 2021-04-26 ENCOUNTER — Telehealth: Payer: Self-pay | Admitting: Internal Medicine

## 2021-04-26 DIAGNOSIS — D75839 Thrombocytosis, unspecified: Secondary | ICD-10-CM

## 2021-04-26 DIAGNOSIS — K219 Gastro-esophageal reflux disease without esophagitis: Secondary | ICD-10-CM | POA: Diagnosis not present

## 2021-04-26 DIAGNOSIS — I10 Essential (primary) hypertension: Secondary | ICD-10-CM | POA: Diagnosis not present

## 2021-04-26 DIAGNOSIS — U071 COVID-19: Secondary | ICD-10-CM

## 2021-04-26 DIAGNOSIS — D509 Iron deficiency anemia, unspecified: Secondary | ICD-10-CM

## 2021-04-26 DIAGNOSIS — E1165 Type 2 diabetes mellitus with hyperglycemia: Secondary | ICD-10-CM | POA: Diagnosis not present

## 2021-04-26 DIAGNOSIS — F439 Reaction to severe stress, unspecified: Secondary | ICD-10-CM

## 2021-04-26 DIAGNOSIS — E7800 Pure hypercholesterolemia, unspecified: Secondary | ICD-10-CM

## 2021-04-26 MED ORDER — MOLNUPIRAVIR EUA 200MG CAPSULE: 4.0000 | ORAL_CAPSULE | Freq: Two times a day (BID) | ORAL | 0 refills | Status: AC

## 2021-04-26 NOTE — Telephone Encounter (Signed)
I have sent in the oral antiviral (molnupiravir).  Keep [place] posted and let us know if any problems.

## 2021-04-26 NOTE — Telephone Encounter (Signed)
Did you discuss this with her today?

## 2021-04-26 NOTE — Telephone Encounter (Signed)
Patient saw Dr.Scott this morning for COVID and she is requesting the antiviral medicine.She stated she has started to feel worse and would like for the medicine to be sent in,please advise.

## 2021-04-26 NOTE — Telephone Encounter (Signed)
See my chart message

## 2021-04-26 NOTE — Progress Notes (Signed)
Patient ID: Lindsey Strickland, female   DOB: 03/28/1976, 45 y.o.   MRN: [id number]   Virtual Visit via video Note  This visit type was conducted due to national recommendations for restrictions regarding the COVID-19 pandemic (e.g. social distancing).  This format is felt to be most appropriate for this patient at this time.  All issues noted in this document were discussed and addressed.  No physical exam was performed (except for noted visual exam findings with Video Visits).   I connected with Lindsey Strickland by a video enabled telemedicine application and verified that I am speaking with the correct person using two identifiers. Location patient: home Location provider: work  Persons participating in the virtual visit: patient, provider  The limitations, risks, security and privacy concerns of performing an evaluation and management service by video and the availability of in person appointments have been discussed.  It has also been discussed with the patient that there may be a patient responsible charge related to this service. The patient expressed understanding and agreed to proceed.   Reason for visit: follow up appt.    HPI: Scheduled to follow up regarding her blood pressure, diabetes and cholesterol.  Daughter with covid.  She has now tested positive.  Visit changed to virtual visit.  She developed ear pain Monday (04/24/21).  Went to [facility].  Treated with omnicef.  Monday am - nasal drainage.  Left ear pain.  Covid negative.  Ear worse yesterday.  Taking mucinex and flonase.  Covid test positive.  Highest temp 98.7.  nasal stuffiness.  Left side congestion.  Some tightness in her chest, but the inhaler helps.  Denies any increased sob or feeling like she cannot get air in.  Eating and drinking.  No vomiting.  Minimal nausea.  No change in bowels.  No increased cough.  Discussed quarantine guidelines.  Discussed oral antiviral medication.    ROS: See pertinent positives and negatives per  HPI.  Past Medical History:  Diagnosis Date   Atypical endometrial hyperplasia    Diabetes mellitus ([place])    Diarrhea    Environmental allergies    Generalized anxiety disorder    GERD (gastroesophageal reflux disease)    Hypercholesterolemia    Hypertension    Insomnia    Major depressive disorder     Past Surgical History:  Procedure Laterality Date   COLONOSCOPY     COLONOSCOPY WITH PROPOFOL N/A 05/28/2018   Procedure: COLONOSCOPY WITH PROPOFOL;  Surgeon: Manya Silvas, MD;  Location: [facility] ENDOSCOPY;  Service: Endoscopy;  Laterality: N/A;   ESOPHAGOGASTRODUODENOSCOPY (EGD) WITH PROPOFOL N/A 05/28/2018   Procedure: ESOPHAGOGASTRODUODENOSCOPY (EGD) WITH PROPOFOL;  Surgeon: Manya Silvas, MD;  Location: [facility] ENDOSCOPY;  Service: Endoscopy;  Laterality: N/A;   HYSTEROSCOPY WITH D & C     and polyp removal    Family History  Problem Relation Age of Onset   Hypertension Mother    Diabetes Mother    Diverticulosis Mother    Depression Mother    Hypercholesterolemia Father    Hypertension Father    Cancer Father    Heart disease Maternal Grandfather    Diverticulosis Paternal Grandmother    Hyperlipidemia Paternal Grandmother    Heart failure Paternal Grandmother    Cancer Maternal Grandmother    Alcoholism Paternal Uncle     SOCIAL HX: reviewed.    Current Outpatient Medications:    molnupiravir EUA (LAGEVRIO) 200 mg CAPS capsule, Take 4 capsules (800 mg total) by mouth 2 (two) times daily  for 5 days., Disp: 40 capsule, Rfl: 0   albuterol (VENTOLIN HFA) 108 (90 Base) MCG/ACT inhaler, Inhale 2 puffs into the lungs every 6 (six) hours as needed for wheezing., Disp: 18 g, Rfl: 2   amLODipine (NORVASC) 10 MG tablet, Take 1/2 (one-half) tablet by mouth twice daily, Disp: 90 tablet, Rfl: 0   famotidine (PEPCID) 20 MG tablet, TAKE 2 TABLETS (40 MG) BY MOUTH NIGHTLY, Disp: , Rfl:    fluticasone (FLONASE) 50 MCG/ACT nasal spray, Use 2 spray(s) in each nostril once  daily, Disp: 16 g, Rfl: 0   glucose blood test strip, Use as directed to check sugars BID. Dx E11.9, Disp: 100 each, Rfl: 12   Lancets MISC, by Does not apply route. Use 1 lancets three times a day, Disp: , Rfl:    levonorgestrel (MIRENA) 20 MCG/DAY IUD, by Intrauterine route., Disp: , Rfl:    loratadine (CLARITIN) 10 MG tablet, Take 10 mg by mouth daily., Disp: , Rfl:    losartan (COZAAR) 100 MG tablet, Take 1 tablet by mouth once daily, Disp: 90 tablet, Rfl: 0   metFORMIN (GLUCOPHAGE) 500 MG tablet, Take 2 tablets by mouth twice daily, Disp: 360 tablet, Rfl: 0   metoprolol succinate (TOPROL-XL) 100 MG 24 hr tablet, Take 1/2 (one-half) tablet by mouth twice daily, Disp: 90 tablet, Rfl: 0   pravastatin (PRAVACHOL) 40 MG tablet, TAKE 1 TABLET BY MOUTH ONCE DAILY. MUST KEEP APPOINTMENT., Disp: 90 tablet, Rfl: 0   terconazole (TERAZOL 7) 0.4 % vaginal cream, Place 1 applicator vaginally at bedtime., Disp: 45 g, Rfl: 0  EXAM:  GENERAL: alert, oriented, appears well and in no acute distress  HEENT: atraumatic, conjunttiva clear, no obvious abnormalities on inspection of external nose and ears  NECK: normal movements of the head and neck  LUNGS: on inspection no signs of respiratory distress, breathing rate appears normal, no obvious gross SOB, gasping or wheezing  CV: no obvious cyanosis  PSYCH/NEURO: pleasant and cooperative, no obvious depression or anxiety, speech and thought processing grossly intact  ASSESSMENT AND PLAN:  Discussed the following assessment and plan:  Problem List Items Addressed This Visit     COVID-19 virus infection    Tested positive for covid - symptoms started 04/24/21.  Being treated for ear infection with omnicef.  Symptoms as outlined.  No increased sob.  No chest pain.  No increased cough or congestion.  Comfortable appearing on video visit.  Discussed quarantine guidelines.  Mucinex, saline nasal spray and steroid nasal spray.  Discussed oral anticoagulation.   Discussed EUA and possible side effects.  She initially wanted to hold on oral antiviral treatment.  Later called back wanting to start.  rx for molnupiravir sent to pharmacy.  Follow.  Call with update.       Relevant Medications   molnupiravir EUA (LAGEVRIO) 200 mg CAPS capsule   GERD (gastroesophageal reflux disease)    Upper symptoms controlled.  On prilosec.        Hypercholesterolemia    Continue pravastatin.  She has declined to change or adjust medication. Wants to work on diet and exercise.        Hypertension    Continue losartan, metoprolol and amlodipine.  Blood pressure as outlined. Persistent elevation. Have discussed changing/adding blood pressure medication.  She has desired to hold on additional medication.   Follow pressures.  Follow metabolic panel.       Iron deficiency anemia    Has seen hematology.  Follow cbc and iron  studies.       Stress    Overall appears to be handling things relatively well.  Follow.        Thrombocytosis ([place])    Has been evaluated by hematology.  Felt to be reactive.  Follow cbc.       Type 2 diabetes mellitus with hyperglycemia ([place])    Overdue labs.  Discussed low carb diet and exercise.  Check met b and a1c.  Hesitant to add medication.  Follow.         Return in about 7 weeks (around 06/14/2021) for follow up appt (90mn) - 6-8 weeks. .   I discussed the assessment and treatment plan with the patient. The patient was provided an opportunity to ask questions and all were answered. The patient agreed with the plan and demonstrated an understanding of the instructions.   The patient was advised to call back or seek an in-person evaluation if the symptoms worsen or if the condition fails to improve as anticipated.    CEinar Pheasant MD

## 2021-04-30 ENCOUNTER — Encounter: Payer: Self-pay | Admitting: Internal Medicine

## 2021-04-30 DIAGNOSIS — U071 COVID-19: Secondary | ICD-10-CM | POA: Insufficient documentation

## 2021-04-30 NOTE — Assessment & Plan Note (Signed)
Overall appears to be handling things relatively well.  Follow.   

## 2021-04-30 NOTE — Assessment & Plan Note (Signed)
Continue losartan, metoprolol and amlodipine.  Blood pressure as outlined. Persistent elevation. Have discussed changing/adding blood pressure medication.  She has desired to hold on additional medication.   Follow pressures.  Follow metabolic panel.

## 2021-04-30 NOTE — Assessment & Plan Note (Signed)
Continue pravastatin.  She has declined to change or adjust medication. Wants to work on diet and exercise.

## 2021-04-30 NOTE — Assessment & Plan Note (Signed)
Has seen hematology.  Follow cbc and iron studies.  

## 2021-04-30 NOTE — Assessment & Plan Note (Signed)
Tested positive for covid - symptoms started 04/24/21.  Being treated for ear infection with omnicef.  Symptoms as outlined.  No increased sob.  No chest pain.  No increased cough or congestion.  Comfortable appearing on video visit.  Discussed quarantine guidelines.  Mucinex, saline nasal spray and steroid nasal spray.  Discussed oral anticoagulation.  Discussed EUA and possible side effects.  She initially wanted to hold on oral antiviral treatment.  Later called back wanting to start.  rx for molnupiravir sent to pharmacy.  Follow.  Call with update.

## 2021-04-30 NOTE — Assessment & Plan Note (Signed)
Overdue labs.  Discussed low carb diet and exercise.  Check met b and a1c.  Hesitant to add medication.  Follow.

## 2021-04-30 NOTE — Assessment & Plan Note (Signed)
Upper symptoms controlled.  On prilosec.

## 2021-04-30 NOTE — Assessment & Plan Note (Signed)
Has been evaluated by hematology.  Felt to be reactive.  Follow cbc.  

## 2021-05-14 ENCOUNTER — Other Ambulatory Visit: Payer: Self-pay | Admitting: Internal Medicine

## 2021-05-29 ENCOUNTER — Other Ambulatory Visit: Payer: Self-pay | Admitting: Internal Medicine

## 2021-06-01 ENCOUNTER — Other Ambulatory Visit: Payer: Self-pay | Admitting: Internal Medicine

## 2021-06-15 ENCOUNTER — Other Ambulatory Visit: Payer: Self-pay

## 2021-06-15 ENCOUNTER — Encounter: Payer: Self-pay | Admitting: Internal Medicine

## 2021-06-15 ENCOUNTER — Ambulatory Visit: Payer: BC Managed Care – PPO | Admitting: Internal Medicine

## 2021-06-15 DIAGNOSIS — I10 Essential (primary) hypertension: Secondary | ICD-10-CM

## 2021-06-15 DIAGNOSIS — E1165 Type 2 diabetes mellitus with hyperglycemia: Secondary | ICD-10-CM | POA: Diagnosis not present

## 2021-06-15 DIAGNOSIS — E7800 Pure hypercholesterolemia, unspecified: Secondary | ICD-10-CM | POA: Diagnosis not present

## 2021-06-15 DIAGNOSIS — R821 Myoglobinuria: Secondary | ICD-10-CM | POA: Diagnosis not present

## 2021-06-15 DIAGNOSIS — D509 Iron deficiency anemia, unspecified: Secondary | ICD-10-CM

## 2021-06-15 DIAGNOSIS — Z1159 Encounter for screening for other viral diseases: Secondary | ICD-10-CM | POA: Diagnosis not present

## 2021-06-15 DIAGNOSIS — R200 Anesthesia of skin: Secondary | ICD-10-CM

## 2021-06-15 DIAGNOSIS — K219 Gastro-esophageal reflux disease without esophagitis: Secondary | ICD-10-CM

## 2021-06-15 DIAGNOSIS — R8290 Unspecified abnormal findings in urine: Secondary | ICD-10-CM

## 2021-06-15 DIAGNOSIS — Z114 Encounter for screening for human immunodeficiency virus [HIV]: Secondary | ICD-10-CM

## 2021-06-15 DIAGNOSIS — D75839 Thrombocytosis, unspecified: Secondary | ICD-10-CM

## 2021-06-15 DIAGNOSIS — F439 Reaction to severe stress, unspecified: Secondary | ICD-10-CM

## 2021-06-15 DIAGNOSIS — R202 Paresthesia of skin: Secondary | ICD-10-CM

## 2021-06-15 LAB — URINALYSIS, ROUTINE W REFLEX MICROSCOPIC
Bilirubin Urine: NEGATIVE
Leukocytes,Ua: NEGATIVE
Nitrite: NEGATIVE
RBC / HPF: NONE SEEN (ref 0–?)
Specific Gravity, Urine: 1.025 (ref 1.000–1.030)
Total Protein, Urine: NEGATIVE
Urine Glucose: NEGATIVE
Urobilinogen, UA: 0.2 (ref 0.0–1.0)
pH: 6 (ref 5.0–8.0)

## 2021-06-15 LAB — BASIC METABOLIC PANEL
BUN: 13 mg/dL (ref 6–23)
CO2: 26 mEq/L (ref 19–32)
Calcium: 9.7 mg/dL (ref 8.4–10.5)
Chloride: 102 mEq/L (ref 96–112)
Creatinine, Ser: 0.49 mg/dL (ref 0.40–1.20)
GFR: 115.87 mL/min (ref >60.00)
Glucose, Bld: 111 mg/dL — ABNORMAL HIGH (ref 70–99)
Potassium: 4.1 mEq/L (ref 3.5–5.1)
Sodium: 138 mEq/L (ref 135–145)

## 2021-06-15 LAB — CBC WITH DIFFERENTIAL/PLATELET
Basophils Absolute: 0 10*3/uL (ref 0.0–0.1)
Basophils Relative: 0.3 % (ref 0.0–3.0)
Eosinophils Absolute: 0.1 10*3/uL (ref 0.0–0.7)
Eosinophils Relative: 1.4 % (ref 0.0–5.0)
HCT: 36 % (ref 36.0–46.0)
Hemoglobin: 11.5 g/dL — ABNORMAL LOW (ref 12.0–15.0)
Lymphocytes Relative: 28.3 % (ref 12.0–46.0)
Lymphs Abs: 3.1 10*3/uL (ref 0.7–4.0)
MCHC: 32 g/dL (ref 30.0–36.0)
MCV: 78.6 fl (ref 78.0–100.0)
Monocytes Absolute: 0.6 10*3/uL (ref 0.1–1.0)
Monocytes Relative: 5.3 % (ref 3.0–12.0)
Neutro Abs: 7 10*3/uL (ref 1.4–7.7)
Neutrophils Relative %: 64.7 % (ref 43.0–77.0)
Platelets: 443 10*3/uL — ABNORMAL HIGH (ref 150.0–400.0)
RBC: 4.58 Mil/uL (ref 3.87–5.11)
RDW: 16 % — ABNORMAL HIGH (ref 11.5–15.5)
WBC: 10.9 10*3/uL — ABNORMAL HIGH (ref 4.0–10.5)

## 2021-06-15 LAB — HEPATIC FUNCTION PANEL
ALT: 12 U/L (ref 0–35)
AST: 11 U/L (ref 0–37)
Albumin: 4.5 g/dL (ref 3.5–5.2)
Alkaline Phosphatase: 62 U/L (ref 39–117)
Bilirubin, Direct: 0.1 mg/dL (ref 0.0–0.3)
Total Bilirubin: 0.4 mg/dL (ref 0.2–1.2)
Total Protein: 7.2 g/dL (ref 6.0–8.3)

## 2021-06-15 LAB — MICROALBUMIN / CREATININE URINE RATIO
Creatinine,U: 155.2 mg/dL
Microalb Creat Ratio: 1.5 mg/g (ref 0.0–30.0)
Microalb, Ur: 2.3 mg/dL — ABNORMAL HIGH (ref 0.0–1.9)

## 2021-06-15 LAB — HEMOGLOBIN A1C: Hgb A1c MFr Bld: 7.5 % — ABNORMAL HIGH (ref 4.6–6.5)

## 2021-06-15 LAB — IBC + FERRITIN
Ferritin: 7.3 ng/mL — ABNORMAL LOW (ref 10.0–291.0)
Iron: 54 ug/dL (ref 42–145)
Saturation Ratios: 10.8 % — ABNORMAL LOW (ref 20.0–50.0)
TIBC: 501.2 ug/dL — ABNORMAL HIGH (ref 250.0–450.0)
Transferrin: 358 mg/dL (ref 212.0–360.0)

## 2021-06-15 LAB — TSH: TSH: 3.32 u[IU]/mL (ref 0.35–5.50)

## 2021-06-15 LAB — LIPID PANEL
Cholesterol: 180 mg/dL (ref 0–200)
HDL: 41.3 mg/dL (ref >39.00)
LDL Cholesterol: 112 mg/dL — ABNORMAL HIGH (ref 0–99)
NonHDL: 138.75
Total CHOL/HDL Ratio: 4
Triglycerides: 134 mg/dL (ref 0.0–149.0)
VLDL: 26.8 mg/dL (ref 0.0–40.0)

## 2021-06-15 NOTE — Progress Notes (Signed)
Patient ID: Lindsey Strickland, female   DOB: Oct 30, 1975, 45 y.o.   MRN: [id number]   Subjective:    Patient ID: Lindsey Strickland, female    DOB: 1975/08/17, 45 y.o.   MRN: [id number]  This visit occurred during the SARS-CoV-2 public health emergency.  Safety protocols were in place, including screening questions prior to the visit, additional usage of staff PPE, and extensive cleaning of exam room while observing appropriate contact time as indicated for disinfecting solutions.   Patient here for a scheduled follow up.   Chief Complaint  Patient presents with   Follow-up   .   HPI Here to follow up regarding her blood pressure, cholesterol and blood sugar.  Recently diagnosed with covid.  Treated with molnupiravir.  Saw pulmonary - diagnosed with sleep apnea - CPAP recommended.  No chest pain.  Breathing overall appears to be stable.  Discussed diet and exercise.  Discussed diabetes and cholesterol treatment.  Desires not to start new medication.     Past Medical History:  Diagnosis Date   Atypical endometrial hyperplasia    Diabetes mellitus ([place])    Diarrhea    Environmental allergies    Generalized anxiety disorder    GERD (gastroesophageal reflux disease)    Hypercholesterolemia    Hypertension    Insomnia    Major depressive disorder    Past Surgical History:  Procedure Laterality Date   COLONOSCOPY     COLONOSCOPY WITH PROPOFOL N/A 05/28/2018   Procedure: COLONOSCOPY WITH PROPOFOL;  Surgeon: Manya Silvas, MD;  Location[facility] ENDOSCOPY;  Service: Endoscopy;  Laterality: N/A;   ESOPHAGOGASTRODUODENOSCOPY (EGD) WITH PROPOFOL N/A 05/28/2018   Procedure: ESOPHAGOGASTRODUODENOSCOPY (EGD) WITH PROPOFOL;  Surgeon: Manya Silvas, MD;  Location: [facility] ENDOSCOPY;  Service: Endoscopy;  Laterality: N/A;   HYSTEROSCOPY WITH D & C     and polyp removal   Family History  Problem Relation Age of Onset   Hypertension Mother    Diabetes Mother    Diverticulosis Mother     Depression Mother    Hypercholesterolemia Father    Hypertension Father    Cancer Father    Heart disease Maternal Grandfather    Diverticulosis Paternal Grandmother    Hyperlipidemia Paternal Grandmother    Heart failure Paternal Grandmother    Cancer Maternal Grandmother    Alcoholism Paternal Uncle    Social History   Socioeconomic History   Marital status: Single    Spouse name: Not on file   Number of children: 2   Years of education: Not on file   Highest education level: High school graduate  Occupational History    Employer: [employer]  Tobacco Use   Smoking status: Former    Packs/day: 1.00    Types: Cigarettes    Quit date: 08/06/2000    Years since quitting: 20.8   Smokeless tobacco: Never  Vaping Use   Vaping Use: Never used  Substance and Sexual Activity   Alcohol use: Yes    Alcohol/week: 0.0 standard drinks    Comment: rare   Drug use: No   Sexual activity: Yes    Birth control/protection: I.U.D.  Other Topics Concern   Not on file  Social History Narrative   Not on file   Social Determinants of Health   Financial Resource Strain: Not on file  Food Insecurity: Not on file  Transportation Needs: Not on file  Physical Activity: Not on file  Stress: Not on file  Social Connections: Not on  file     Review of Systems  Constitutional:  Negative for appetite change and unexpected weight change.  HENT:  Negative for congestion and sinus pressure.   Respiratory:  Negative for cough and chest tightness.        Breathing stable.   Cardiovascular:  Negative for chest pain, palpitations and leg swelling.  Gastrointestinal:  Negative for diarrhea, nausea and vomiting.  Genitourinary:  Negative for difficulty urinating.       Recent UTI.  Recheck. Odor urine.   Musculoskeletal:  Negative for joint swelling and myalgias.  Neurological:  Negative for dizziness, light-headedness and headaches.  Psychiatric/Behavioral:  Negative for agitation and  dysphoric mood.       Objective:     BP 132/68 (BP Location: Left Arm, Patient Position: Sitting, Cuff Size: Large)   Pulse 92   Temp 98 F (36.7 C) (Oral)   Ht 5\' 8"  (1.727 m)   Wt 282 lb 9.6 oz (128.2 kg)   SpO2 99%   BMI 42.97 kg/m  Wt Readings from Last 3 Encounters:  06/15/21 282 lb 9.6 oz (128.2 kg)  04/26/21 289 lb (131.1 kg)  02/28/21 289 lb (131.1 kg)    Physical Exam Vitals reviewed.  Constitutional:      General: She is not in acute distress.    Appearance: Normal appearance.  HENT:     Head: Normocephalic and atraumatic.     Right Ear: External ear normal.     Left Ear: External ear normal.  Eyes:     General: No scleral icterus.       Right eye: No discharge.        Left eye: No discharge.     Conjunctiva/sclera: Conjunctivae normal.  Neck:     Thyroid: No thyromegaly.  Cardiovascular:     Rate and Rhythm: Normal rate and regular rhythm.  Pulmonary:     Effort: No respiratory distress.     Breath sounds: Normal breath sounds. No wheezing.  Abdominal:     General: Bowel sounds are normal.     Palpations: Abdomen is soft.     Tenderness: There is no abdominal tenderness.  Musculoskeletal:        General: No swelling or tenderness.     Cervical back: Neck supple. No tenderness.  Lymphadenopathy:     Cervical: No cervical adenopathy.  Skin:    Findings: No erythema or rash.  Neurological:     Mental Status: She is alert.  Psychiatric:        Mood and Affect: Mood normal.        Behavior: Behavior normal.     Outpatient Encounter Medications as of 06/15/2021  Medication Sig   albuterol (VENTOLIN HFA) 108 (90 Base) MCG/ACT inhaler Inhale 2 puffs into the lungs every 6 (six) hours as needed for wheezing.   amLODipine (NORVASC) 10 MG tablet Take 1/2 (one-half) tablet by mouth twice daily   famotidine (PEPCID) 20 MG tablet TAKE 2 TABLETS (40 MG) BY MOUTH NIGHTLY   fluticasone (FLONASE) 50 MCG/ACT nasal spray Use 2 spray(s) in each nostril once  daily   glucose blood test strip Use as directed to check sugars BID. Dx E11.9   Lancets MISC by Does not apply route. Use 1 lancets three times a day   levonorgestrel (MIRENA) 20 MCG/DAY IUD by Intrauterine route.   loratadine (CLARITIN) 10 MG tablet Take 10 mg by mouth daily.   losartan (COZAAR) 100 MG tablet Take 1 tablet by mouth once daily  metFORMIN (GLUCOPHAGE) 500 MG tablet Take 2 tablets by mouth twice daily   metoprolol succinate (TOPROL-XL) 100 MG 24 hr tablet Take 1/2 (one-half) tablet by mouth twice daily   pravastatin (PRAVACHOL) 40 MG tablet TAKE 1 TABLET BY MOUTH ONCE DAILY. MUST KEEP APPOINTMENT.   [DISCONTINUED] terconazole (TERAZOL 7) 0.4 % vaginal cream Place 1 applicator vaginally at bedtime. (Patient not taking: Reported on 06/15/2021)   No facility-administered encounter medications on file as of 06/15/2021.     Lab Results  Component Value Date   WBC 10.9 (H) 06/15/2021   HGB 11.5 (L) 06/15/2021   HCT 36.0 06/15/2021   PLT 443.0 (H) 06/15/2021   GLUCOSE 111 (H) 06/15/2021   CHOL 180 06/15/2021   TRIG 134.0 06/15/2021   HDL 41.30 06/15/2021   LDLDIRECT 132.0 11/02/2020   LDLCALC 112 (H) 06/15/2021   ALT 12 06/15/2021   AST 11 06/15/2021   NA 138 06/15/2021   K 4.1 06/15/2021   CL 102 06/15/2021   CREATININE 0.49 06/15/2021   BUN 13 06/15/2021   CO2 26 06/15/2021   TSH 3.32 06/15/2021   INR 0.93 05/05/2018   HGBA1C 7.5 (H) 06/15/2021   MICROALBUR 2.3 (H) 06/15/2021       Assessment & Plan:   Problem List Items Addressed This Visit     Bad odor of urine    Check urine to confirm no infection.       Diabetes mellitus ([place])   Relevant Orders   Microalbumin / creatinine urine ratio (Completed)   GERD (gastroesophageal reflux disease)    Upper symptoms controlled.  On prilosec.        Hypercholesterolemia    Continue pravastatin.  She has declined to change or adjust medication. Wants to work on diet and exercise.        Hypertension     Continue losartan, metoprolol and amlodipine.  Blood pressure as outlined. Discussed additional medication.  She declines.  Follow pressures.  Follow metabolic panel.       Relevant Orders   TSH (Completed)   Iron deficiency anemia    Has seen hematology.  Follow cbc and iron studies.        Numbness and tingling    Saw neurology.  Had EMG - CTS.  Follow.        Severe obesity (BMI >= 40) (HCC)    Discussed diet and exercise.  Follow.  Discussed GLP - 1 agonists to treat diabetes and help with weight loss.  Has had GI issues. Doing better now.  Wants to hold on medication.  Follow.       Stress    Overall appears to be handling things relatively well.  Follow.        Thrombocytosis ([place])    Has been evaluated by hematology.  Felt to be reactive.  Follow cbc.       Other Visit Diagnoses     Screening for HIV without presence of risk factors       Need for hepatitis C screening test       Myoglobinuria            Einar Pheasant, MD

## 2021-06-15 NOTE — Patient Instructions (Addendum)
Diabetic medications:  mounjaro, ozempic  Blood pressure medication:  add hydralazine.

## 2021-06-16 LAB — HEPATITIS C ANTIBODY
Hepatitis C Ab: NONREACTIVE
SIGNAL TO CUT-OFF: 0.02 (ref <1.00)

## 2021-06-16 LAB — HIV ANTIBODY (ROUTINE TESTING W REFLEX): HIV 1&2 Ab, 4th Generation: NONREACTIVE

## 2021-06-17 LAB — URINE CULTURE
MICRO NUMBER:: 12620970
SPECIMEN QUALITY:: ADEQUATE

## 2021-06-18 ENCOUNTER — Encounter: Payer: Self-pay | Admitting: Internal Medicine

## 2021-06-19 MED ORDER — CEFDINIR 300 MG PO CAPS: 300.0000 mg | ORAL_CAPSULE | Freq: Two times a day (BID) | ORAL | 0 refills | Status: DC

## 2021-06-19 NOTE — Telephone Encounter (Signed)
Rx sent in for omnicef.  My chart message sent to pt to notify her abx sent in.

## 2021-06-25 NOTE — Assessment & Plan Note (Addendum)
Continue losartan, metoprolol and amlodipine.  Blood pressure as outlined. Discussed additional medication.  She declines.  Follow pressures.  Follow metabolic panel.

## 2021-06-25 NOTE — Assessment & Plan Note (Signed)
Continue pravastatin.  She has declined to change or adjust medication. Wants to work on diet and exercise.

## 2021-06-25 NOTE — Assessment & Plan Note (Signed)
Saw neurology.  Had EMG - CTS.  Follow.

## 2021-06-25 NOTE — Assessment & Plan Note (Signed)
Check urine to confirm no infection.  

## 2021-06-25 NOTE — Assessment & Plan Note (Signed)
Upper symptoms controlled.  On prilosec.

## 2021-06-25 NOTE — Assessment & Plan Note (Signed)
Discussed diet and exercise.  Follow.  Discussed GLP - 1 agonists to treat diabetes and help with weight loss.  Has had GI issues. Doing better now.  Wants to hold on medication.  Follow.

## 2021-06-25 NOTE — Assessment & Plan Note (Signed)
Has seen hematology.  Follow cbc and iron studies.  

## 2021-06-25 NOTE — Assessment & Plan Note (Signed)
Overall appears to be handling things relatively well.  Follow.   

## 2021-06-25 NOTE — Assessment & Plan Note (Signed)
Low carb diet and exercise.  On metformin.  Discussed other treatment options.  Declines to start new medication at this time.  Wants to work on diet and exercise.  Follow met b and a1c.

## 2021-06-25 NOTE — Assessment & Plan Note (Signed)
Has been evaluated by hematology.  Felt to be reactive.  Follow cbc.  

## 2021-07-03 ENCOUNTER — Other Ambulatory Visit: Payer: Self-pay | Admitting: Internal Medicine

## 2021-07-07 ENCOUNTER — Other Ambulatory Visit: Payer: Self-pay | Admitting: Internal Medicine

## 2021-07-07 MED ORDER — AMLODIPINE BESYLATE 10 MG PO TABS: 10.0000 mg | ORAL_TABLET | Freq: Every day | ORAL | 0 refills | Status: DC

## 2021-07-07 MED ORDER — METFORMIN HCL 500 MG PO TABS: 1000.0000 mg | ORAL_TABLET | Freq: Two times a day (BID) | ORAL | 0 refills | Status: DC

## 2021-07-17 ENCOUNTER — Other Ambulatory Visit: Payer: Self-pay | Admitting: Internal Medicine

## 2021-07-20 ENCOUNTER — Other Ambulatory Visit: Payer: Self-pay | Admitting: Internal Medicine

## 2021-07-20 MED ORDER — METOPROLOL SUCCINATE ER 100 MG PO TB24: 50.0000 mg | ORAL_TABLET | Freq: Two times a day (BID) | ORAL | 0 refills | Status: DC

## 2021-08-02 ENCOUNTER — Encounter: Payer: Self-pay | Admitting: Internal Medicine

## 2021-08-14 ENCOUNTER — Other Ambulatory Visit: Payer: Self-pay | Admitting: Internal Medicine

## 2021-08-15 ENCOUNTER — Encounter: Payer: Self-pay | Admitting: Internal Medicine

## 2021-08-16 MED ORDER — ALBUTEROL SULFATE HFA 108 (90 BASE) MCG/ACT IN AERS: 2.0000 | INHALATION_SPRAY | Freq: Four times a day (QID) | RESPIRATORY_TRACT | 2 refills | Status: DC | PRN

## 2021-09-04 ENCOUNTER — Other Ambulatory Visit: Payer: Self-pay | Admitting: Internal Medicine

## 2021-09-07 ENCOUNTER — Other Ambulatory Visit: Payer: Self-pay | Admitting: Internal Medicine

## 2021-09-08 MED ORDER — FLUTICASONE PROPIONATE 50 MCG/ACT NA SUSP: 1.0000 | Freq: Every day | NASAL | 0 refills | Status: DC

## 2021-09-15 ENCOUNTER — Encounter: Payer: Self-pay | Admitting: Internal Medicine

## 2021-09-15 ENCOUNTER — Telehealth: Payer: BC Managed Care – PPO | Admitting: Internal Medicine

## 2021-09-15 DIAGNOSIS — I10 Essential (primary) hypertension: Secondary | ICD-10-CM

## 2021-09-15 DIAGNOSIS — D75839 Thrombocytosis, unspecified: Secondary | ICD-10-CM

## 2021-09-15 DIAGNOSIS — F439 Reaction to severe stress, unspecified: Secondary | ICD-10-CM

## 2021-09-15 DIAGNOSIS — K219 Gastro-esophageal reflux disease without esophagitis: Secondary | ICD-10-CM | POA: Diagnosis not present

## 2021-09-15 DIAGNOSIS — E1165 Type 2 diabetes mellitus with hyperglycemia: Secondary | ICD-10-CM | POA: Diagnosis not present

## 2021-09-15 DIAGNOSIS — E538 Deficiency of other specified B group vitamins: Secondary | ICD-10-CM | POA: Diagnosis not present

## 2021-09-15 DIAGNOSIS — E559 Vitamin D deficiency, unspecified: Secondary | ICD-10-CM

## 2021-09-15 DIAGNOSIS — E7800 Pure hypercholesterolemia, unspecified: Secondary | ICD-10-CM

## 2021-09-15 DIAGNOSIS — D509 Iron deficiency anemia, unspecified: Secondary | ICD-10-CM

## 2021-09-15 DIAGNOSIS — N920 Excessive and frequent menstruation with regular cycle: Secondary | ICD-10-CM

## 2021-09-15 DIAGNOSIS — R928 Other abnormal and inconclusive findings on diagnostic imaging of breast: Secondary | ICD-10-CM

## 2021-09-15 DIAGNOSIS — Z30431 Encounter for routine checking of intrauterine contraceptive device: Secondary | ICD-10-CM

## 2021-09-15 NOTE — Progress Notes (Addendum)
Patient ID: Lindsey Strickland, female   DOB: 10-28-1975, 46 y.o.   MRN: [id number]   Virtual Visit via video Note  This visit type was conducted due to national recommendations for restrictions regarding the COVID-19 pandemic (e.g. social distancing).  This format is felt to be most appropriate for this patient at this time.  All issues noted in this document were discussed and addressed.  No physical exam was performed (except for noted visual exam findings with Video Visits).   I connected with Lindsey Strickland by a video enabled telemedicine application and verified that I am speaking with the correct person using two identifiers. Location patient: home Location provider: work  Persons participating in the virtual visit: patient, provider  The limitations, risks, security and privacy concerns of performing an evaluation and management service by video and the availability of in person appointments have been discussed. It has also been discussed with the patient that there may be a patient responsible charge related to this service. The patient expressed understanding and agreed to proceed.   Reason for visit: follow up appt  HPI: Follow up regarding her diabetes, cholesterol and blood pressure.  Elected to do a virtual visit today.  Has not been checking her blood sugars.  No checking blood pressures.  Trying to watch her diet.  Does report increased snacking at night.  Discussed options.  No chest pain or sob reported.  No increased cough or congestion.  Is s/p hysteroscopy and endometrial biopsy.  Biopsy ok.  Also had pap, HPV negative, pap unsatisfactory.  Bleeding heavier since biopsy.  Changed IUD.  Bleeding appears to be some better since IUD change.  Has been more emotional.  Increased stress.  Family stress.  Discussed. Seeing her therapist.  Just evaluated yesterday.  Has a safety plan.     ROS: See pertinent positives and negatives per HPI.  Past Medical History:  Diagnosis Date    Atypical endometrial hyperplasia    Diabetes mellitus ([place])    Diarrhea    Environmental allergies    Generalized anxiety disorder    GERD (gastroesophageal reflux disease)    Hypercholesterolemia    Hypertension    Insomnia    Major depressive disorder     Past Surgical History:  Procedure Laterality Date   COLONOSCOPY     COLONOSCOPY WITH PROPOFOL N/A 05/28/2018   Procedure: COLONOSCOPY WITH PROPOFOL;  Surgeon: Manya Silvas, MD;  Location: [facility] ENDOSCOPY;  Service: Endoscopy;  Laterality: N/A;   ESOPHAGOGASTRODUODENOSCOPY (EGD) WITH PROPOFOL N/A 05/28/2018   Procedure: ESOPHAGOGASTRODUODENOSCOPY (EGD) WITH PROPOFOL;  Surgeon: Manya Silvas, MD;  Location[facility] ENDOSCOPY;  Service: Endoscopy;  Laterality: N/A;   HYSTEROSCOPY WITH D & C     and polyp removal    Family History  Problem Relation Age of Onset   Hypertension Mother    Diabetes Mother    Diverticulosis Mother    Depression Mother    Hypercholesterolemia Father    Hypertension Father    Cancer Father    Heart disease Maternal Grandfather    Diverticulosis Paternal Grandmother    Hyperlipidemia Paternal Grandmother    Heart failure Paternal Grandmother    Cancer Maternal Grandmother    Alcoholism Paternal Uncle     SOCIAL HX: reviewed.    Current Outpatient Medications:    albuterol (VENTOLIN HFA) 108 (90 Base) MCG/ACT inhaler, Inhale 2 puffs into the lungs every 6 (six) hours as needed for wheezing. Patient request ProAir brand only., Disp: 18 g, Rfl:  2   amLODipine (NORVASC) 10 MG tablet, Take 1 tablet (10 mg total) by mouth daily., Disp: 90 tablet, Rfl: 0   Cholecalciferol 10 MCG (400 UNIT) CHEW, Chew by mouth., Disp: , Rfl:    famotidine (PEPCID) 20 MG tablet, TAKE 2 TABLETS (40 MG) BY MOUTH NIGHTLY, Disp: , Rfl:    fluticasone (FLONASE) 50 MCG/ACT nasal spray, Place 1 spray into both nostrils daily., Disp: 16 g, Rfl: 0   glucose blood test strip, Use as directed to check sugars BID. Dx E11.9,  Disp: 100 each, Rfl: 12   Lancets MISC, by Does not apply route. Use 1 lancets three times a day, Disp: , Rfl:    levonorgestrel (MIRENA) 20 MCG/DAY IUD, by Intrauterine route., Disp: , Rfl:    loratadine (CLARITIN) 10 MG tablet, Take 10 mg by mouth daily., Disp: , Rfl:    losartan (COZAAR) 100 MG tablet, Take 1 tablet by mouth once daily, Disp: 90 tablet, Rfl: 0   metFORMIN (GLUCOPHAGE) 500 MG tablet, Take 2 tablets (1,000 mg total) by mouth 2 (two) times daily., Disp: 360 tablet, Rfl: 0   metoprolol succinate (TOPROL-XL) 100 MG 24 hr tablet, Take 0.5 tablets (50 mg total) by mouth 2 (two) times daily. Take with or immediately following a meal., Disp: 90 tablet, Rfl: 0   pravastatin (PRAVACHOL) 40 MG tablet, TAKE 1 TABLET BY MOUTH ONCE DAILY. MUST KEEP APPOINTMENT., Disp: 90 tablet, Rfl: 1  EXAM:  GENERAL: alert, oriented, appears well and in no acute distress  HEENT: atraumatic, conjunttiva clear, no obvious abnormalities on inspection of external nose and ears  NECK: normal movements of the head and neck  LUNGS: on inspection no signs of respiratory distress, breathing rate appears normal, no obvious gross SOB, gasping or wheezing  CV: no obvious cyanosis  PSYCH/NEURO: pleasant and cooperative, no obvious depression or anxiety, speech and thought processing grossly intact  ASSESSMENT AND PLAN:  Discussed the following assessment and plan:  Problem List Items Addressed This Visit     Abnormal mammogram    S/p biopsy.  Need to confirm f/u.        GERD (gastroesophageal reflux disease)    Upper symptoms controlled.  On prilosec.        Hypercholesterolemia    Continue pravastatin.  She has declined to change or adjust medication. Diet and exercise.        Relevant Orders   Hepatic function panel   Lipid panel   Hypertension    Continue losartan, metoprolol and amlodipine.  Not checking blood pressures.  Discussed need to check.  Follow pressures.  Follow metabolic panel.        Intrauterine device surveillance    Just changed.  Bleeding appears to be doing some better.  Follow        Iron deficiency anemia    Has seen hematology.  Follow cbc and ferritin.       Relevant Orders   CBC with Differential/Platelet   Ferritin   Menorrhagia    S/p gyn evaluation.  S/p hysteroscopy and biopsy as outlined.  IUD changed.  Bleeding appears to be better.  Follow.        Stress    Increased stress as outlined.  Seeing a therapist.  Continue f/u with therapy.       Thrombocytosis ([place])    Has been evaluated by hematology.  Felt to be reactive.  Follow cbc.       Type 2 diabetes mellitus with hyperglycemia (HCC)  Low carb diet and exercise.  On metformin.  Has previously declined to start new medication at this time. Not checking sugars.  Needs to check.  Follow met b and a1c.       Relevant Orders   Hemoglobin X0P   Basic metabolic panel   Vitamin D deficiency    Currently taking vitamin D3 2000 units per day.  Check vitamin D level with next labs.       Relevant Orders   VITAMIN D 25 Hydroxy (Vit-D Deficiency, Fractures)   Other Visit Diagnoses     B12 deficiency    -  Primary   Relevant Orders   Vitamin B12       Return in about 2 months (around 11/13/2021) for follow up with pap.   I discussed the assessment and treatment plan with the patient. The patient was provided an opportunity to ask questions and all were answered. The patient agreed with the plan and demonstrated an understanding of the instructions.   The patient was advised to call back or seek an in-person evaluation if the symptoms worsen or if the condition fails to improve as anticipated.    Einar Pheasant, MD

## 2021-09-16 ENCOUNTER — Telehealth: Payer: Self-pay | Admitting: Internal Medicine

## 2021-09-16 ENCOUNTER — Encounter: Payer: Self-pay | Admitting: Internal Medicine

## 2021-09-16 NOTE — Assessment & Plan Note (Signed)
Just changed.  Bleeding appears to be doing some better.  Follow

## 2021-09-16 NOTE — Assessment & Plan Note (Signed)
Upper symptoms controlled.  On prilosec.

## 2021-09-16 NOTE — Assessment & Plan Note (Signed)
S/p gyn evaluation.  S/p hysteroscopy and biopsy as outlined.  IUD changed.  Bleeding appears to be better.  Follow.

## 2021-09-16 NOTE — Addendum Note (Signed)
Addended by: Alisa Graff on: 09/16/2021 02:08 PM   Modules accepted: Orders

## 2021-09-16 NOTE — Assessment & Plan Note (Signed)
Has been evaluated by hematology.  Felt to be reactive.  Follow cbc.  

## 2021-09-16 NOTE — Assessment & Plan Note (Signed)
S/p biopsy.  Need to confirm f/u.

## 2021-09-16 NOTE — Assessment & Plan Note (Signed)
Continue pravastatin.  She has declined to change or adjust medication. Diet and exercise.

## 2021-09-16 NOTE — Telephone Encounter (Signed)
Had abnormal mammogram spring of 2022.  S/p biopsy.  Should be due mammogram.  Need to confirm if scheduled.  If not scheduled, need to schedule.  Need to confirm where wants scheduled.

## 2021-09-16 NOTE — Assessment & Plan Note (Signed)
Currently taking vitamin D3 2000 units per day.  Check vitamin D level with next labs.

## 2021-09-16 NOTE — Assessment & Plan Note (Signed)
Has seen hematology.  Follow cbc and ferritin.  

## 2021-09-16 NOTE — Assessment & Plan Note (Signed)
Increased stress as outlined.  Seeing a therapist.  Continue f/u with therapy.

## 2021-09-16 NOTE — Assessment & Plan Note (Signed)
Continue losartan, metoprolol and amlodipine.  Not checking blood pressures.  Discussed need to check.  Follow pressures.  Follow metabolic panel.

## 2021-09-16 NOTE — Telephone Encounter (Signed)
Needs a f/u appt in 2 months (with pap).  Can schedule a 12:00.  Also need fasting labs 1-2 days before f/u appt.  Labs have been ordered.

## 2021-09-16 NOTE — Assessment & Plan Note (Signed)
Low carb diet and exercise.  On metformin.  Has previously declined to start new medication at this time. Not checking sugars.  Needs to check.  Follow met b and a1c.

## 2021-09-18 NOTE — Telephone Encounter (Signed)
Mychart sent to patient.

## 2021-09-18 NOTE — Telephone Encounter (Signed)
Reviewed the ingredients.  Let her know that this iron has B12 and vitamin C in the tablet with the iron.  Ok to take if tolerating.  We do need to follow  her cbc and iron.

## 2021-09-18 NOTE — Telephone Encounter (Signed)
I think this was discuss during her video visit

## 2021-10-18 MED ORDER — INTEGRA 62.5-62.5-40-3 MG PO CAPS: ORAL_CAPSULE | ORAL | 2 refills | Status: DC

## 2021-10-18 NOTE — Telephone Encounter (Signed)
Rx sent in for integra #30 with 2 refills.  ?

## 2021-10-25 ENCOUNTER — Other Ambulatory Visit: Payer: Self-pay | Admitting: Internal Medicine

## 2021-10-26 MED ORDER — METOPROLOL SUCCINATE ER 100 MG PO TB24: 50.0000 mg | ORAL_TABLET | Freq: Two times a day (BID) | ORAL | 0 refills | Status: DC

## 2021-10-27 ENCOUNTER — Telehealth: Payer: BC Managed Care – PPO | Admitting: Internal Medicine

## 2021-11-13 ENCOUNTER — Other Ambulatory Visit: Payer: Self-pay | Admitting: Internal Medicine

## 2021-12-15 ENCOUNTER — Other Ambulatory Visit (INDEPENDENT_AMBULATORY_CARE_PROVIDER_SITE_OTHER): Payer: BC Managed Care – PPO

## 2021-12-15 ENCOUNTER — Ambulatory Visit: Payer: BC Managed Care – PPO | Admitting: Internal Medicine

## 2021-12-15 ENCOUNTER — Encounter: Payer: Self-pay | Admitting: Internal Medicine

## 2021-12-15 DIAGNOSIS — I10 Essential (primary) hypertension: Secondary | ICD-10-CM

## 2021-12-15 DIAGNOSIS — D509 Iron deficiency anemia, unspecified: Secondary | ICD-10-CM

## 2021-12-15 DIAGNOSIS — E538 Deficiency of other specified B group vitamins: Secondary | ICD-10-CM | POA: Diagnosis not present

## 2021-12-15 DIAGNOSIS — N390 Urinary tract infection, site not specified: Secondary | ICD-10-CM

## 2021-12-15 DIAGNOSIS — D75839 Thrombocytosis, unspecified: Secondary | ICD-10-CM

## 2021-12-15 DIAGNOSIS — K219 Gastro-esophageal reflux disease without esophagitis: Secondary | ICD-10-CM

## 2021-12-15 DIAGNOSIS — F439 Reaction to severe stress, unspecified: Secondary | ICD-10-CM

## 2021-12-15 DIAGNOSIS — R002 Palpitations: Secondary | ICD-10-CM

## 2021-12-15 DIAGNOSIS — E7800 Pure hypercholesterolemia, unspecified: Secondary | ICD-10-CM

## 2021-12-15 DIAGNOSIS — E559 Vitamin D deficiency, unspecified: Secondary | ICD-10-CM | POA: Diagnosis not present

## 2021-12-15 DIAGNOSIS — R8290 Unspecified abnormal findings in urine: Secondary | ICD-10-CM

## 2021-12-15 DIAGNOSIS — E1165 Type 2 diabetes mellitus with hyperglycemia: Secondary | ICD-10-CM

## 2021-12-15 LAB — CBC WITH DIFFERENTIAL/PLATELET
Basophils Absolute: 0 10*3/uL (ref 0.0–0.1)
Basophils Relative: 0.3 % (ref 0.0–3.0)
Eosinophils Absolute: 0.1 10*3/uL (ref 0.0–0.7)
Eosinophils Relative: 1.2 % (ref 0.0–5.0)
HCT: 33.6 % — ABNORMAL LOW (ref 36.0–46.0)
Hemoglobin: 10.7 g/dL — ABNORMAL LOW (ref 12.0–15.0)
Lymphocytes Relative: 22.1 % (ref 12.0–46.0)
Lymphs Abs: 2.3 10*3/uL (ref 0.7–4.0)
MCHC: 32 g/dL (ref 30.0–36.0)
MCV: 78.8 fl (ref 78.0–100.0)
Monocytes Absolute: 0.7 10*3/uL (ref 0.1–1.0)
Monocytes Relative: 6.3 % (ref 3.0–12.0)
Neutro Abs: 7.2 10*3/uL (ref 1.4–7.7)
Neutrophils Relative %: 70.1 % (ref 43.0–77.0)
Platelets: 409 10*3/uL — ABNORMAL HIGH (ref 150.0–400.0)
RBC: 4.26 Mil/uL (ref 3.87–5.11)
RDW: 15.9 % — ABNORMAL HIGH (ref 11.5–15.5)
WBC: 10.3 10*3/uL (ref 4.0–10.5)

## 2021-12-15 LAB — BASIC METABOLIC PANEL
BUN: 13 mg/dL (ref 6–23)
CO2: 25 mEq/L (ref 19–32)
Calcium: 9.6 mg/dL (ref 8.4–10.5)
Chloride: 103 mEq/L (ref 96–112)
Creatinine, Ser: 0.5 mg/dL (ref 0.40–1.20)
GFR: 114.91 mL/min (ref >60.00)
Glucose, Bld: 137 mg/dL — ABNORMAL HIGH (ref 70–99)
Potassium: 4.2 mEq/L (ref 3.5–5.1)
Sodium: 138 mEq/L (ref 135–145)

## 2021-12-15 LAB — LIPID PANEL
Cholesterol: 148 mg/dL (ref 0–200)
HDL: 37.8 mg/dL — ABNORMAL LOW (ref >39.00)
LDL Cholesterol: 85 mg/dL (ref 0–99)
NonHDL: 110.24
Total CHOL/HDL Ratio: 4
Triglycerides: 124 mg/dL (ref 0.0–149.0)
VLDL: 24.8 mg/dL (ref 0.0–40.0)

## 2021-12-15 LAB — HEMOGLOBIN A1C: Hgb A1c MFr Bld: 7.2 % — ABNORMAL HIGH (ref 4.6–6.5)

## 2021-12-15 LAB — VITAMIN B12: Vitamin B-12: 269 pg/mL (ref 211–911)

## 2021-12-15 LAB — HEPATIC FUNCTION PANEL
ALT: 11 U/L (ref 0–35)
AST: 10 U/L (ref 0–37)
Albumin: 4.4 g/dL (ref 3.5–5.2)
Alkaline Phosphatase: 58 U/L (ref 39–117)
Bilirubin, Direct: 0.1 mg/dL (ref 0.0–0.3)
Total Bilirubin: 0.4 mg/dL (ref 0.2–1.2)
Total Protein: 7 g/dL (ref 6.0–8.3)

## 2021-12-15 LAB — FERRITIN: Ferritin: 7.6 ng/mL — ABNORMAL LOW (ref 10.0–291.0)

## 2021-12-15 LAB — VITAMIN D 25 HYDROXY (VIT D DEFICIENCY, FRACTURES): VITD: 22.33 ng/mL — ABNORMAL LOW (ref 30.00–100.00)

## 2021-12-15 NOTE — Progress Notes (Signed)
Patient ID: Lindsey Strickland, female   DOB: 09/24/1975, 46 y.o.   MRN: [id number] ? ? ?Subjective:  ? ? Patient ID: Lindsey Strickland, female    DOB: 06/21/1976, 47 y.o.   MRN: [id number] ? ?This visit occurred during the SARS-CoV-2 public health emergency.  Safety protocols were in place, including screening questions prior to the visit, additional usage of staff PPE, and extensive cleaning of exam room while observing appropriate contact time as indicated for disinfecting solutions.  ? ?Patient here for a scheduled follow up.  ?  ? ?HPI ?Here to follow up regarding her cholesterol, blood sugar and blood pressure.  Has had two recent UTIs.  Treated.  Previous abscess - treated - resolved.  Discussed recurring UTIs.  Discussed referral to urology.  Will notify me if agreeable.  Last UTI one month ago.  Treated with omnicef.  No chest pain.  Some palpitations.  Breathing overall stable.  No vomiting reported.  Increased stress and depression discussed.  Seeing/talking to her counselor.  This is going well.  Discussed diet and exercise.  States has been eating dark chocolate, etc.  ? ? ?Past Medical History:  ?Diagnosis Date  ? Atypical endometrial hyperplasia   ? Diabetes mellitus ([place])   ? Diarrhea   ? Environmental allergies   ? Generalized anxiety disorder   ? GERD (gastroesophageal reflux disease)   ? Hypercholesterolemia   ? Hypertension   ? Insomnia   ? Major depressive disorder   ? ?Past Surgical History:  ?Procedure Laterality Date  ? COLONOSCOPY    ? COLONOSCOPY WITH PROPOFOL N/A 05/28/2018  ? Procedure: COLONOSCOPY WITH PROPOFOL;  Surgeon: Manya Silvas, MD;  Location: [facility] ENDOSCOPY;  Service: Endoscopy;  Laterality: N/A;  ? ESOPHAGOGASTRODUODENOSCOPY (EGD) WITH PROPOFOL N/A 05/28/2018  ? Procedure: ESOPHAGOGASTRODUODENOSCOPY (EGD) WITH PROPOFOL;  Surgeon: Manya Silvas, MD;  Location: [facility] ENDOSCOPY;  Service: Endoscopy;  Laterality: N/A;  ? HYSTEROSCOPY WITH D & C    ? and polyp removal  ? ?Family  History  ?Problem Relation Age of Onset  ? Hypertension Mother   ? Diabetes Mother   ? Diverticulosis Mother   ? Depression Mother   ? Hypercholesterolemia Father   ? Hypertension Father   ? Cancer Father   ? Heart disease Maternal Grandfather   ? Diverticulosis Paternal Grandmother   ? Hyperlipidemia Paternal Grandmother   ? Heart failure Paternal Grandmother   ? Cancer Maternal Grandmother   ? Alcoholism Paternal Uncle   ? ?Social History  ? ?Socioeconomic History  ? Marital status: Single  ?  Spouse name: Not on file  ? Number of children: 2  ? Years of education: Not on file  ? Highest education level: High school graduate  ?Occupational History  ?  Employer: [employer]  ?Tobacco Use  ? Smoking status: Former  ?  Packs/day: 1.00  ?  Types: Cigarettes  ?  Quit date: 08/06/2000  ?  Years since quitting: 21.3  ? Smokeless tobacco: Never  ?Vaping Use  ? Vaping Use: Never used  ?Substance and Sexual Activity  ? Alcohol use: Yes  ?  Alcohol/week: 0.0 standard drinks  ?  Comment: rare  ? Drug use: No  ? Sexual activity: Yes  ?  Birth control/protection: I.U.D.  ?Other Topics Concern  ? Not on file  ?Social History Narrative  ? Not on file  ? ?Social Determinants of Health  ? ?Financial Resource Strain: Not on file  ?Food Insecurity: Not on file  ?  Transportation Needs: Not on file  ?Physical Activity: Not on file  ?Stress: Not on file  ?Social Connections: Not on file  ? ? ? ?Review of Systems  ?Constitutional:  Negative for appetite change and unexpected weight change.  ?HENT:  Negative for congestion and sinus pressure.   ?Respiratory:  Negative for cough, chest tightness and shortness of breath.   ?Cardiovascular:  Negative for chest pain and leg swelling.  ?Gastrointestinal:  Negative for abdominal pain, nausea and vomiting.  ?Genitourinary:  Negative for difficulty urinating and dysuria.  ?Musculoskeletal:  Negative for joint swelling and myalgias.  ?Skin:  Negative for color change and rash.  ?Neurological:   Negative for dizziness and headaches.  ?Psychiatric/Behavioral:    ?     Increased stress, some depression as outlined. Seeing her counselor.    ? ?   ?Objective:  ?  ? ?BP 140/64 (BP Location: Left Arm, Patient Position: Sitting) Comment (Cuff Size): bariatric cuff  Pulse 98   Temp 98.7 ?F (37.1 ?C) (Oral)   Ht '5\' 8"'$  (1.727 m)   Wt 290 lb 9.6 oz (131.8 kg)   SpO2 98%   BMI 44.19 kg/m?  ?Wt Readings from Last 3 Encounters:  ?12/15/21 290 lb 9.6 oz (131.8 kg)  ?06/15/21 282 lb 9.6 oz (128.2 kg)  ?04/26/21 289 lb (131.1 kg)  ? ? ?Physical Exam ?Vitals reviewed.  ?Constitutional:   ?   General: She is not in acute distress. ?   Appearance: Normal appearance.  ?HENT:  ?   Head: Normocephalic and atraumatic.  ?   Right Ear: External ear normal.  ?   Left Ear: External ear normal.  ?Eyes:  ?   General: No scleral icterus.    ?   Right eye: No discharge.     ?   Left eye: No discharge.  ?   Conjunctiva/sclera: Conjunctivae normal.  ?Neck:  ?   Thyroid: No thyromegaly.  ?Cardiovascular:  ?   Rate and Rhythm: Normal rate and regular rhythm.  ?Pulmonary:  ?   Effort: No respiratory distress.  ?   Breath sounds: Normal breath sounds. No wheezing.  ?Abdominal:  ?   General: Bowel sounds are normal.  ?   Palpations: Abdomen is soft.  ?   Tenderness: There is no abdominal tenderness.  ?Musculoskeletal:     ?   General: No swelling or tenderness.  ?   Cervical back: Neck supple. No tenderness.  ?Lymphadenopathy:  ?   Cervical: No cervical adenopathy.  ?Skin: ?   Findings: No erythema or rash.  ?Neurological:  ?   Mental Status: She is alert.  ?Psychiatric:     ?   Mood and Affect: Mood normal.     ?   Behavior: Behavior normal.  ? ? ? ?Outpatient Encounter Medications as of 12/15/2021  ?Medication Sig  ? albuterol (VENTOLIN HFA) 108 (90 Base) MCG/ACT inhaler Inhale 2 puffs into the lungs every 6 (six) hours as needed for wheezing. Patient request ProAir brand only.  ? amLODipine (NORVASC) 10 MG tablet Take 1 tablet (10 mg  total) by mouth daily.  ? Cholecalciferol 10 MCG (400 UNIT) CHEW Chew by mouth.  ? famotidine (PEPCID) 20 MG tablet TAKE 2 TABLETS (40 MG) BY MOUTH NIGHTLY  ? Fe Fum-FePoly-Vit C-Vit B3 (INTEGRA) 62.5-62.5-40-3 MG CAPS One capsule per day  ? fluticasone (FLONASE) 50 MCG/ACT nasal spray Place 1 spray into both nostrils daily.  ? glucose blood test strip Use as directed to check sugars  BID. Dx E11.9  ? Lancets MISC by Does not apply route. Use 1 lancets three times a day  ? levonorgestrel (MIRENA) 20 MCG/DAY IUD by Intrauterine route.  ? loratadine (CLARITIN) 10 MG tablet Take 10 mg by mouth daily.  ? losartan (COZAAR) 100 MG tablet Take 1 tablet by mouth once daily  ? metFORMIN (GLUCOPHAGE) 500 MG tablet Take 2 tablets (1,000 mg total) by mouth 2 (two) times daily.  ? metoprolol succinate (TOPROL-XL) 100 MG 24 hr tablet Take 0.5 tablets (50 mg total) by mouth 2 (two) times daily. Take with or immediately following a meal.  ? pravastatin (PRAVACHOL) 40 MG tablet TAKE 1 TABLET BY MOUTH ONCE DAILY. MUST KEEP APPOINTMENT.  ? ?No facility-administered encounter medications on file as of 12/15/2021.  ?  ? ?Lab Results  ?Component Value Date  ? WBC 10.3 12/15/2021  ? HGB 10.7 (L) 12/15/2021  ? HCT 33.6 (L) 12/15/2021  ? PLT 409.0 (H) 12/15/2021  ? GLUCOSE 137 (H) 12/15/2021  ? CHOL 148 12/15/2021  ? TRIG 124.0 12/15/2021  ? HDL 37.80 (L) 12/15/2021  ? LDLDIRECT 132.0 11/02/2020  ? [place] 85 12/15/2021  ? ALT 11 12/15/2021  ? AST 10 12/15/2021  ? NA 138 12/15/2021  ? K 4.2 12/15/2021  ? CL 103 12/15/2021  ? CREATININE 0.50 12/15/2021  ? BUN 13 12/15/2021  ? CO2 25 12/15/2021  ? TSH 3.32 06/15/2021  ? INR 0.93 05/05/2018  ? HGBA1C 7.2 (H) 12/15/2021  ? MICROALBUR 2.3 (H) 06/15/2021  ? ? ?   ?Assessment & Plan:  ? ?Problem List Items Addressed This Visit   ? ? Frequent UTI  ?  Discussed urology referral to help determine etiology.  Will notify me when agreeable for referral.  No symptoms currently.   ? ?  ?  ? GERD  (gastroesophageal reflux disease)  ?  Upper symptoms controlled.  On prilosec.   ? ?  ?  ? Hypercholesterolemia  ?  Continue pravastatin.  She has declined to change or adjust medication. Diet and exercise.   ? ?  ?  ? Hype

## 2021-12-16 LAB — URINE CULTURE
MICRO NUMBER:: 13389185
SPECIMEN QUALITY:: ADEQUATE

## 2021-12-18 ENCOUNTER — Encounter: Payer: Self-pay | Admitting: Internal Medicine

## 2021-12-18 DIAGNOSIS — N390 Urinary tract infection, site not specified: Secondary | ICD-10-CM | POA: Insufficient documentation

## 2021-12-18 NOTE — Assessment & Plan Note (Signed)
Has seen hematology.  Follow cbc and ferritin.  

## 2021-12-18 NOTE — Assessment & Plan Note (Signed)
Continue pravastatin.  She has declined to change or adjust medication. Diet and exercise.   ?

## 2021-12-18 NOTE — Assessment & Plan Note (Signed)
Continue losartan, metoprolol and amlodipine.  Not checking blood pressures.  Discussed need to check.  Follow pressures.  Follow metabolic panel.  ?

## 2021-12-18 NOTE — Assessment & Plan Note (Signed)
Upper symptoms controlled.  On prilosec.   ?

## 2021-12-18 NOTE — Assessment & Plan Note (Signed)
Low carb diet and exercise.  On metformin.  Has previously declined to start new medication.  Not checking sugars.  Needs to check.  Follow met b and a1c.  ?

## 2021-12-18 NOTE — Assessment & Plan Note (Signed)
Increased stress.  Discussed.  Seeing her therapist.  This is going well.  Follow.  ?

## 2021-12-18 NOTE — Assessment & Plan Note (Signed)
Has been evaluated by hematology.  Felt to be reactive.  Follow cbc.  

## 2021-12-18 NOTE — Assessment & Plan Note (Signed)
Discussed diet and exercise.  Follow.  Discussed GLP - 1 agonists to treat diabetes and help with weight loss.  Has had GI issues. Doing better now.  Wants to hold on medication.  Follow.  ?

## 2021-12-18 NOTE — Assessment & Plan Note (Signed)
Saw cariology previously.  Had negative stress test.  Overall appears to be stable.  Follow.  Check routine labs.  ?

## 2021-12-18 NOTE — Assessment & Plan Note (Signed)
Discussed urology referral to help determine etiology.  Will notify me when agreeable for referral.  No symptoms currently.   ?

## 2021-12-20 ENCOUNTER — Other Ambulatory Visit: Payer: Self-pay

## 2021-12-20 DIAGNOSIS — D509 Iron deficiency anemia, unspecified: Secondary | ICD-10-CM

## 2021-12-20 MED ORDER — GLUCOSE BLOOD VI STRP: ORAL_STRIP | 12 refills | Status: DC

## 2021-12-20 MED ORDER — FLUTICASONE PROPIONATE 50 MCG/ACT NA SUSP: 1.0000 | Freq: Every day | NASAL | 0 refills | Status: DC

## 2021-12-20 NOTE — Progress Notes (Signed)
Referral placed.

## 2021-12-25 ENCOUNTER — Other Ambulatory Visit: Payer: Self-pay | Admitting: Internal Medicine

## 2022-01-01 ENCOUNTER — Encounter: Payer: Self-pay | Admitting: Internal Medicine

## 2022-01-01 ENCOUNTER — Other Ambulatory Visit: Payer: Self-pay | Admitting: Internal Medicine

## 2022-01-02 NOTE — Telephone Encounter (Signed)
S/w pt - she stated this is not new.  Had happened to her many times a few years ago, but had stopped. Pt concerned as to why its happening again.  Concerned that it felt soo deep and it took her breathe away. Pt states achey strain like feeling is still there. Pt encouraged to schedule follow up to be evaluated, agreed. Scheduled.

## 2022-01-02 NOTE — Telephone Encounter (Signed)
Agree with need for evaluation to determine etiology of pain.  It appears appt has been scheduled.  If any acute change or persistent pain, will need to be checked prior to 01/04/22 appt

## 2022-01-04 ENCOUNTER — Ambulatory Visit (INDEPENDENT_AMBULATORY_CARE_PROVIDER_SITE_OTHER): Payer: BC Managed Care – PPO | Admitting: Internal Medicine

## 2022-01-04 ENCOUNTER — Encounter: Payer: Self-pay | Admitting: Internal Medicine

## 2022-01-04 VITALS — BP 136/74 | HR 97 | Temp 97.9°F | Resp 16 | Ht 68.0 in | Wt 292.8 lb

## 2022-01-04 DIAGNOSIS — R109 Unspecified abdominal pain: Secondary | ICD-10-CM | POA: Insufficient documentation

## 2022-01-04 DIAGNOSIS — E1165 Type 2 diabetes mellitus with hyperglycemia: Secondary | ICD-10-CM

## 2022-01-04 DIAGNOSIS — R1084 Generalized abdominal pain: Secondary | ICD-10-CM

## 2022-01-04 DIAGNOSIS — D509 Iron deficiency anemia, unspecified: Secondary | ICD-10-CM | POA: Diagnosis not present

## 2022-01-04 DIAGNOSIS — R002 Palpitations: Secondary | ICD-10-CM

## 2022-01-04 DIAGNOSIS — I10 Essential (primary) hypertension: Secondary | ICD-10-CM

## 2022-01-04 DIAGNOSIS — D75839 Thrombocytosis, unspecified: Secondary | ICD-10-CM | POA: Diagnosis not present

## 2022-01-04 DIAGNOSIS — R928 Other abnormal and inconclusive findings on diagnostic imaging of breast: Secondary | ICD-10-CM

## 2022-01-04 DIAGNOSIS — N390 Urinary tract infection, site not specified: Secondary | ICD-10-CM

## 2022-01-04 DIAGNOSIS — K219 Gastro-esophageal reflux disease without esophagitis: Secondary | ICD-10-CM

## 2022-01-04 LAB — IBC + FERRITIN
Ferritin: 5.1 ng/mL — ABNORMAL LOW (ref 10.0–291.0)
Iron: 45 ug/dL (ref 42–145)
Saturation Ratios: 8.3 % — ABNORMAL LOW (ref 20.0–50.0)
TIBC: 540.4 ug/dL — ABNORMAL HIGH (ref 250.0–450.0)
Transferrin: 386 mg/dL — ABNORMAL HIGH (ref 212.0–360.0)

## 2022-01-04 LAB — HEPATIC FUNCTION PANEL
ALT: 17 U/L (ref 0–35)
AST: 15 U/L (ref 0–37)
Albumin: 4.4 g/dL (ref 3.5–5.2)
Alkaline Phosphatase: 60 U/L (ref 39–117)
Bilirubin, Direct: 0.1 mg/dL (ref 0.0–0.3)
Total Bilirubin: 0.3 mg/dL (ref 0.2–1.2)
Total Protein: 7.2 g/dL (ref 6.0–8.3)

## 2022-01-04 LAB — BASIC METABOLIC PANEL
BUN: 15 mg/dL (ref 6–23)
CO2: 26 mEq/L (ref 19–32)
Calcium: 10 mg/dL (ref 8.4–10.5)
Chloride: 99 mEq/L (ref 96–112)
Creatinine, Ser: 0.5 mg/dL (ref 0.40–1.20)
GFR: 114.86 mL/min (ref >60.00)
Glucose, Bld: 162 mg/dL — ABNORMAL HIGH (ref 70–99)
Potassium: 4.6 mEq/L (ref 3.5–5.1)
Sodium: 138 mEq/L (ref 135–145)

## 2022-01-04 LAB — CBC WITH DIFFERENTIAL/PLATELET
Basophils Absolute: 0.1 10*3/uL (ref 0.0–0.1)
Basophils Relative: 0.5 % (ref 0.0–3.0)
Eosinophils Absolute: 0.1 10*3/uL (ref 0.0–0.7)
Eosinophils Relative: 1.2 % (ref 0.0–5.0)
HCT: 35.6 % — ABNORMAL LOW (ref 36.0–46.0)
Hemoglobin: 11.3 g/dL — ABNORMAL LOW (ref 12.0–15.0)
Lymphocytes Relative: 20.2 % (ref 12.0–46.0)
Lymphs Abs: 2.2 10*3/uL (ref 0.7–4.0)
MCHC: 31.7 g/dL (ref 30.0–36.0)
MCV: 78.5 fl (ref 78.0–100.0)
Monocytes Absolute: 0.7 10*3/uL (ref 0.1–1.0)
Monocytes Relative: 6.2 % (ref 3.0–12.0)
Neutro Abs: 7.9 10*3/uL — ABNORMAL HIGH (ref 1.4–7.7)
Neutrophils Relative %: 71.9 % (ref 43.0–77.0)
Platelets: 424 10*3/uL — ABNORMAL HIGH (ref 150.0–400.0)
RBC: 4.54 Mil/uL (ref 3.87–5.11)
RDW: 16 % — ABNORMAL HIGH (ref 11.5–15.5)
WBC: 11 10*3/uL — ABNORMAL HIGH (ref 4.0–10.5)

## 2022-01-04 LAB — URINALYSIS, ROUTINE W REFLEX MICROSCOPIC
Bilirubin Urine: NEGATIVE
Leukocytes,Ua: NEGATIVE
Nitrite: NEGATIVE
Specific Gravity, Urine: 1.02 (ref 1.000–1.030)
Total Protein, Urine: NEGATIVE
Urine Glucose: NEGATIVE
Urobilinogen, UA: 0.2 (ref 0.0–1.0)
pH: 6 (ref 5.0–8.0)

## 2022-01-04 NOTE — Progress Notes (Unsigned)
Patient ID: Lindsey Strickland, female   DOB: February 25, 1976, 46 y.o.   MRN: [id number]   Subjective:    Patient ID: Lindsey Strickland, female    DOB: 1976-06-09, 46 y.o.   MRN: [id number]   Patient here for work in appt.    HPI Work in to discuss recent abdominal pain.  She is accompanied by her husband.  History obtained from both of them.  Reports she was fixing her hair (Sunday - 12/31/21).  Had arms over her head.  Noticed a pain in her abdomen.  Leaned over - increased pain.  Went down on her knees and leaned over - maybe some better.  Could not stand back up due to pain.  Husband had to help.  Pain when through to her back.  No bruising.  No rash.  Husband checked back - felt a knot.  Massaged.  Felt better with massage.  Reports if bends head forward/tilts forward - increased pain in her abdomen.  Some epigastric discomfort since.  Some low right side back discomfort.  No chest pain.  Had previously reported palpitations.  Request EKG to follow up on this.  Breathing stable.  No increased cough or congestion.  No vomiting.  Bowels moving.  No injury or trauma.     Past Medical History:  Diagnosis Date   Atypical endometrial hyperplasia    Diabetes mellitus ([place])    Diarrhea    Environmental allergies    Generalized anxiety disorder    GERD (gastroesophageal reflux disease)    Hypercholesterolemia    Hypertension    Insomnia    Major depressive disorder    Past Surgical History:  Procedure Laterality Date   COLONOSCOPY     COLONOSCOPY WITH PROPOFOL N/A 05/28/2018   Procedure: COLONOSCOPY WITH PROPOFOL;  Surgeon: Manya Silvas, MD;  Location: [facility] ENDOSCOPY;  Service: Endoscopy;  Laterality: N/A;   ESOPHAGOGASTRODUODENOSCOPY (EGD) WITH PROPOFOL N/A 05/28/2018   Procedure: ESOPHAGOGASTRODUODENOSCOPY (EGD) WITH PROPOFOL;  Surgeon: Manya Silvas, MD;  Location: [facility] ENDOSCOPY;  Service: Endoscopy;  Laterality: N/A;   HYSTEROSCOPY WITH D & C     and polyp removal   Family History   Problem Relation Age of Onset   Hypertension Mother    Diabetes Mother    Diverticulosis Mother    Depression Mother    Hypercholesterolemia Father    Hypertension Father    Cancer Father    Heart disease Maternal Grandfather    Diverticulosis Paternal Grandmother    Hyperlipidemia Paternal Grandmother    Heart failure Paternal Grandmother    Cancer Maternal Grandmother    Alcoholism Paternal Uncle    Social History   Socioeconomic History   Marital status: Single    Spouse name: Not on file   Number of children: 2   Years of education: Not on file   Highest education level: High school graduate  Occupational History    Employer: [employer]  Tobacco Use   Smoking status: Former    Packs/day: 1.00    Types: Cigarettes    Quit date: 08/06/2000    Years since quitting: 21.4   Smokeless tobacco: Never  Vaping Use   Vaping Use: Never used  Substance and Sexual Activity   Alcohol use: Yes    Alcohol/week: 0.0 standard drinks    Comment: rare   Drug use: No   Sexual activity: Yes    Birth control/protection: I.U.D.  Other Topics Concern   Not on file  Social History  Narrative   Not on file   Social Determinants of Health   Financial Resource Strain: Not on file  Food Insecurity: Not on file  Transportation Needs: Not on file  Physical Activity: Not on file  Stress: Not on file  Social Connections: Not on file     Review of Systems  Constitutional:  Negative for appetite change and unexpected weight change.  HENT:  Negative for congestion and sinus pressure.   Respiratory:  Negative for cough, chest tightness and shortness of breath.   Cardiovascular:  Negative for chest pain and leg swelling.  Gastrointestinal:  Positive for abdominal pain. Negative for diarrhea, nausea and vomiting.  Genitourinary:  Negative for difficulty urinating and dysuria.  Musculoskeletal:  Positive for back pain. Negative for joint swelling and myalgias.  Skin:  Negative for  color change and rash.  Neurological:  Negative for dizziness, light-headedness and headaches.  Psychiatric/Behavioral:  Negative for agitation and dysphoric mood.       Objective:     BP 136/74 (BP Location: Left Arm, Patient Position: Sitting, Cuff Size: Large)   Pulse 97   Temp 97.9 F (36.6 C)   Resp 16   Ht '5\' 8"'  (1.727 m)   Wt 292 lb 12.8 oz (132.8 kg)   SpO2 99%   BMI 44.52 kg/m  Wt Readings from Last 3 Encounters:  01/04/22 292 lb 12.8 oz (132.8 kg)  12/15/21 290 lb 9.6 oz (131.8 kg)  06/15/21 282 lb 9.6 oz (128.2 kg)    Physical Exam Vitals reviewed.  Constitutional:      General: She is not in acute distress.    Appearance: Normal appearance.  HENT:     Head: Normocephalic and atraumatic.     Right Ear: External ear normal.     Left Ear: External ear normal.  Eyes:     General: No scleral icterus.       Right eye: No discharge.        Left eye: No discharge.     Conjunctiva/sclera: Conjunctivae normal.  Neck:     Thyroid: No thyromegaly.  Cardiovascular:     Rate and Rhythm: Normal rate and regular rhythm.  Pulmonary:     Effort: No respiratory distress.     Breath sounds: Normal breath sounds. No wheezing.  Abdominal:     General: Bowel sounds are normal.     Palpations: Abdomen is soft.     Comments: Minimal tenderness - abdomen diffuse and epigastric region.  No rebound or guarding.    Musculoskeletal:        General: No swelling.     Cervical back: Neck supple. No tenderness.     Comments: Minimal tenderness - right/mid lower back.   Lymphadenopathy:     Cervical: No cervical adenopathy.  Skin:    Findings: No erythema or rash.  Neurological:     Mental Status: She is alert.  Psychiatric:        Mood and Affect: Mood normal.        Behavior: Behavior normal.     Outpatient Encounter Medications as of 01/04/2022  Medication Sig   albuterol (VENTOLIN HFA) 108 (90 Base) MCG/ACT inhaler Inhale 2 puffs into the lungs every 6 (six) hours as  needed for wheezing. Patient request ProAir brand only.   amLODipine (NORVASC) 10 MG tablet Take 1/2 (one-half) tablet by mouth twice daily   Cholecalciferol 10 MCG (400 UNIT) CHEW Chew by mouth.   famotidine (PEPCID) 20 MG tablet TAKE 2  TABLETS (40 MG) BY MOUTH NIGHTLY   Fe Fum-FePoly-Vit C-Vit B3 (INTEGRA) 62.5-62.5-40-3 MG CAPS One capsule per day   fluticasone (FLONASE) 50 MCG/ACT nasal spray Place 1 spray into both nostrils daily.   glucose blood test strip Use as directed to check sugars BID. Dx E11.9   Lancets MISC by Does not apply route. Use 1 lancets three times a day   levonorgestrel (MIRENA) 20 MCG/DAY IUD by Intrauterine route.   loratadine (CLARITIN) 10 MG tablet Take 10 mg by mouth daily.   losartan (COZAAR) 100 MG tablet Take 1 tablet by mouth once daily   metFORMIN (GLUCOPHAGE) 500 MG tablet Take 2 tablets by mouth twice daily   metoprolol succinate (TOPROL-XL) 100 MG 24 hr tablet Take 0.5 tablets (50 mg total) by mouth 2 (two) times daily. Take with or immediately following a meal.   pravastatin (PRAVACHOL) 40 MG tablet TAKE 1 TABLET BY MOUTH ONCE DAILY. MUST KEEP APPOINTMENT.   No facility-administered encounter medications on file as of 01/04/2022.     Lab Results  Component Value Date   WBC 11.0 (H) 01/04/2022   HGB 11.3 (L) 01/04/2022   HCT 35.6 (L) 01/04/2022   PLT 424.0 (H) 01/04/2022   GLUCOSE 162 (H) 01/04/2022   CHOL 148 12/15/2021   TRIG 124.0 12/15/2021   HDL 37.80 (L) 12/15/2021   LDLDIRECT 132.0 11/02/2020   LDLCALC 85 12/15/2021   ALT 17 01/04/2022   AST 15 01/04/2022   NA 138 01/04/2022   K 4.6 01/04/2022   CL 99 01/04/2022   CREATININE 0.50 01/04/2022   BUN 15 01/04/2022   CO2 26 01/04/2022   TSH 3.32 06/15/2021   INR 0.93 05/05/2018   HGBA1C 7.2 (H) 12/15/2021   MICROALBUR 2.3 (H) 06/15/2021       Assessment & Plan:   Problem List Items Addressed This Visit     Abdominal pain - Primary    Abdominal pain and back pain as outlined.   Symptoms and exam seem to be more msk origin, but given persistent pain and epigastric discomfort, discussed CT scan to further evaluate.  Check cbc, met b, liver panel and urine.  Pain has improved some.  Follow for change/resolution.         Relevant Orders   Hepatic function panel (Completed)   Basic metabolic panel (Completed)   Urinalysis, Routine w reflex microscopic (Completed)   Urine Culture   CT Abdomen Pelvis W Contrast   Abnormal mammogram    Mammogram 10/23/21 - Birads II.        Frequent UTI    Will check urine to confirm no infection.        GERD (gastroesophageal reflux disease)    Upper symptoms controlled.  On prilosec.         Hypertension    Continue losartan, metoprolol and amlodipine.  Follow pressures.  Follow metabolic panel.        Iron deficiency anemia    Has seen hematology.  Follow cbc and ferritin.        Relevant Orders   CBC with Differential/Platelet (Completed)   IBC + Ferritin (Completed)   Palpitations    Saw cariology previously.  Had negative stress test.  Had previous reported palpitations.  Request EKG today.  EKG - SR with no acute ischemic changes.  Follow.        Relevant Orders   EKG 12-Lead (Completed)   Thrombocytosis ([place])    Has been evaluated by hematology.  Felt to  be reactive.  Recheck cbc given abdominal pain.        Type 2 diabetes mellitus with hyperglycemia ([facility])    Low carb diet and exercise.  On metformin.  Follow met b and a1c.          Einar Pheasant, MD

## 2022-01-05 ENCOUNTER — Encounter: Payer: Self-pay | Admitting: Internal Medicine

## 2022-01-05 LAB — URINE CULTURE
MICRO NUMBER:: 13470564
SPECIMEN QUALITY:: ADEQUATE

## 2022-01-05 NOTE — Assessment & Plan Note (Signed)
Low carb diet and exercise.  On metformin.  Follow met b and a1c.  

## 2022-01-05 NOTE — Assessment & Plan Note (Addendum)
Has been evaluated by hematology.  Felt to be reactive.  Recheck cbc given abdominal pain.

## 2022-01-05 NOTE — Assessment & Plan Note (Signed)
Abdominal pain and back pain as outlined.  Symptoms and exam seem to be more msk origin, but given persistent pain and epigastric discomfort, discussed CT scan to further evaluate.  Check cbc, met b, liver panel and urine.  Pain has improved some.  Follow for change/resolution.

## 2022-01-05 NOTE — Assessment & Plan Note (Signed)
Continue losartan, metoprolol and amlodipine.  Follow pressures.  Follow metabolic panel.

## 2022-01-05 NOTE — Assessment & Plan Note (Signed)
Upper symptoms controlled.  On prilosec.

## 2022-01-05 NOTE — Assessment & Plan Note (Signed)
Will check urine to confirm no infection.

## 2022-01-05 NOTE — Assessment & Plan Note (Signed)
Mammogram 10/23/21 - Birads II.

## 2022-01-05 NOTE — Assessment & Plan Note (Signed)
Has seen hematology.  Follow cbc and ferritin.  

## 2022-01-05 NOTE — Assessment & Plan Note (Signed)
Saw cariology previously.  Had negative stress test.  Had previous reported palpitations.  Request EKG today.  EKG - SR with no acute ischemic changes.  Follow.

## 2022-01-17 ENCOUNTER — Encounter: Payer: Self-pay | Admitting: Internal Medicine

## 2022-01-17 NOTE — Telephone Encounter (Signed)
Please call and notify her that I would like for her to stop her metformin 24 hours before her scan and 48 hours after scan.  Stay hydrated.

## 2022-01-22 ENCOUNTER — Ambulatory Visit
Admission: RE | Admit: 2022-01-22 | Discharge: 2022-01-22 | Disposition: A | Discharge status: Home / Self Care | Payer: BC Managed Care – PPO | Source: Ambulatory Visit | Attending: Internal Medicine | Admitting: Internal Medicine

## 2022-01-22 DIAGNOSIS — R1084 Generalized abdominal pain: Secondary | ICD-10-CM | POA: Insufficient documentation

## 2022-01-22 MED ORDER — IOHEXOL 300 MG/ML  SOLN: 125.0000 mL | Freq: Once | INTRAMUSCULAR | Status: DC | PRN

## 2022-01-22 MED ORDER — IOHEXOL 350 MG/ML SOLN
100.0000 mL | Freq: Once | INTRAVENOUS | Status: AC | PRN
  Administered 2022-01-22: 100 mL via INTRAVENOUS

## 2022-01-23 ENCOUNTER — Other Ambulatory Visit: Payer: Self-pay | Admitting: Internal Medicine

## 2022-01-23 DIAGNOSIS — N2889 Other specified disorders of kidney and ureter: Secondary | ICD-10-CM

## 2022-01-23 NOTE — Progress Notes (Signed)
Order placed for urology referral.  

## 2022-01-26 ENCOUNTER — Ambulatory Visit: Payer: BC Managed Care – PPO | Admitting: Urology

## 2022-01-26 ENCOUNTER — Telehealth: Payer: Self-pay | Admitting: Internal Medicine

## 2022-01-26 ENCOUNTER — Encounter: Payer: Self-pay | Admitting: Urology

## 2022-01-26 VITALS — BP 161/80 | HR 91 | Ht 68.0 in | Wt 292.0 lb

## 2022-01-26 DIAGNOSIS — N2889 Other specified disorders of kidney and ureter: Secondary | ICD-10-CM | POA: Diagnosis not present

## 2022-01-26 DIAGNOSIS — N200 Calculus of kidney: Secondary | ICD-10-CM | POA: Diagnosis not present

## 2022-01-26 DIAGNOSIS — E669 Obesity, unspecified: Secondary | ICD-10-CM

## 2022-01-26 MED ORDER — DIAZEPAM 10 MG PO TABS: 10.0000 mg | ORAL_TABLET | Freq: Once | ORAL | 0 refills | Status: DC

## 2022-01-26 NOTE — Progress Notes (Signed)
01/26/22 4:19 PM   Lindsey Strickland, Lindsey Strickland [id number]  Referring provider:  Dale Akron, MD [address] [place],  [place] [address] Chief Complaint  Patient presents with   renal mass    HPI: Lindsey Strickland is a 46 y.o.female who presents today for further evaluation of renal mass.   She has a personal history of recurrent UTIs.  No other known GU issues prior to this.  She was seen by her PCP, Dr Lindsey Laurel, on 01/04/2022, she was noted to have abdominal pain. She underwent a CT abdomen and pelvis on 01/22/2022 visualized 3 cm heterogeneously enhancing mass in the lower pole right kidney. A second 1.5 cm mild heterogeneous mass is identified in the upper pole right kidney. The findings are suspicious for neoplasm. 3 mm nonobstructing stone in the lower pole left kidney.   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 She reports that she has not had imaging with contrast.   Per se she is a very anxious person.  She had difficulty excepting IV contrast.  She also has concerns about medications.  She is requesting an open MRI.   PMH: Past Medical History:  Diagnosis Date   Atypical endometrial hyperplasia    Diabetes mellitus (HCC)    Diarrhea    Environmental allergies    Generalized anxiety disorder    GERD (gastroesophageal reflux disease)    Hypercholesterolemia    Hypertension    Insomnia    Major depressive disorder     Surgical History: Past Surgical History:  Procedure Laterality Date   COLONOSCOPY     COLONOSCOPY WITH PROPOFOL N/A 05/28/2018   Procedure: COLONOSCOPY WITH PROPOFOL;  Surgeon: Scot Jun, MD;  Location: [facility] ENDOSCOPY;  Service: Endoscopy;  Laterality: N/A;   ESOPHAGOGASTRODUODENOSCOPY (EGD) WITH PROPOFOL N/A 05/28/2018   Procedure: ESOPHAGOGASTRODUODENOSCOPY (EGD) WITH PROPOFOL;  Surgeon: Scot Jun, MD;  Location: [facility] ENDOSCOPY;  Service: Endoscopy;  Laterality:  N/A;   HYSTEROSCOPY WITH D & C     and polyp removal    Home Medications:  Allergies as of 01/26/2022       Reactions   Tetracycline Other (See Comments)   Other reaction(s): Unknown   Augmentin [amoxicillin-pot Clavulanate]    GI intolerance   Hctz [hydrochlorothiazide]    Lisinopril    Other reaction(s): Other (See Comments) cough   Other    Mycins   Prednisone    Chest pain   Ciprofloxacin Nausea Only, Palpitations        Medication List        Accurate as of January 26, 2022 11:59 PM. If you have any questions, ask your nurse or doctor.          albuterol 108 (90 Base) MCG/ACT inhaler Commonly known as: VENTOLIN HFA Inhale 2 puffs into the lungs every 6 (six) hours as needed for wheezing. Patient request ProAir brand only.   amLODipine 10 MG tablet Commonly known as: NORVASC Take 1/2 (one-half) tablet by mouth twice daily   Cholecalciferol 10 MCG (400 UNIT) Chew Chew by mouth.   diazepam 10 MG tablet Commonly known as: Valium Take 1 tablet (10 mg total) by mouth once for 1 dose. 30 min prior to procedure Started by: Vanna Scotland, MD   famotidine 20 MG tablet Commonly known as: PEPCID TAKE 2 TABLETS (40 MG) BY MOUTH NIGHTLY   fluticasone 50 MCG/ACT nasal spray Commonly known as: FLONASE Place 1 spray into both nostrils  daily.   glucose blood test strip Use as directed to check sugars BID. Dx E11.9   Integra 62.5-62.5-40-3 MG Caps One capsule per day   Lancets Misc by Does not apply route. Use 1 lancets three times a day   levonorgestrel 20 MCG/DAY Iud Commonly known as: MIRENA by Intrauterine route.   loratadine 10 MG tablet Commonly known as: CLARITIN Take 10 mg by mouth daily.   losartan 100 MG tablet Commonly known as: COZAAR Take 1 tablet by mouth once daily   metFORMIN 500 MG tablet Commonly known as: GLUCOPHAGE Take 2 tablets by mouth twice daily   metoprolol succinate 100 MG 24 hr tablet Commonly known as: TOPROL-XL Take  0.5 tablets (50 mg total) by mouth 2 (two) times daily. Take with or immediately following a meal.   pravastatin 40 MG tablet Commonly known as: PRAVACHOL TAKE 1 TABLET BY MOUTH ONCE DAILY. MUST KEEP APPOINTMENT.        Allergies:  Allergies  Allergen Reactions   Tetracycline Other (See Comments)    Other reaction(s): Unknown    Augmentin [Amoxicillin-Pot Clavulanate]     GI intolerance    Hctz [Hydrochlorothiazide]    Lisinopril     Other reaction(s): Other (See Comments) cough   Other     Mycins   Prednisone     Chest pain   Ciprofloxacin Nausea Only and Palpitations    Family History: Family History  Problem Relation Age of Onset   Hypertension Mother    Diabetes Mother    Diverticulosis Mother    Depression Mother    Hypercholesterolemia Father    Hypertension Father    Cancer Father    Heart disease Maternal Grandfather    Diverticulosis Paternal Grandmother    Hyperlipidemia Paternal Grandmother    Heart failure Paternal Grandmother    Cancer Maternal Grandmother    Alcoholism Paternal Uncle     Social History:  reports that she quit smoking about 21 years ago. Her smoking use included cigarettes. She smoked an average of 1 pack per day. She has never used smokeless tobacco. She reports current alcohol use. She reports that she does not use drugs.   Physical Exam: BP (!) 161/80   Pulse 91   Ht 5\' 8"  (1.727 m)   Wt 292 lb (132.5 kg)   BMI 44.40 kg/m   Constitutional:  Alert and oriented, No acute distress.  Accompanied by husband today.  Obese. HEENT: [place] AT, moist mucus membranes.  Trachea midline, no masses. Cardiovascular: No clubbing, cyanosis, or edema. Respiratory: Normal respiratory effort, no increased work of breathing. Skin: No rashes, bruises or suspicious lesions. Neurologic: Grossly intact, no focal deficits, moving all 4 extremities. Psychiatric: Normal mood and affect.  Laboratory Data:  Lab Results  Component Value Date    CREATININE 0.50 01/04/2022   Lab Results  Component Value Date   HGBA1C 7.2 (H) 12/15/2021   Pertinent Imaging: CLINICAL DATA:  Acute nonlocalized abdomen pain since the end of May 2023.   EXAM: CT ABDOMEN AND PELVIS WITH CONTRAST   TECHNIQUE: Multidetector CT imaging of the abdomen and pelvis was performed using the standard protocol following bolus administration of intravenous contrast.   RADIATION DOSE REDUCTION: This exam was performed according to the departmental dose-optimization program which includes automated exposure control, adjustment of the mA and/or kV according to patient size and/or use of iterative reconstruction technique.   CONTRAST:  OMNIPAQUE IOHEXOL 350 MG/ML SOLN   COMPARISON:  Dec 07, 2015  FINDINGS: Lower chest: No acute abnormality.   Hepatobiliary: Focal fatty infiltration of liver at the falciform ligament is identified. No focal liver lesion is noted. The gallbladder and biliary tree normal.   Pancreas: Unremarkable. No pancreatic ductal dilatation or surrounding inflammatory changes.   Spleen: Normal in size without focal abnormality.   Adrenals/Urinary Tract: Hypertrophy of bilateral adrenal glands are unchanged. There is a 3 x 3 cm heterogeneously enhancing mass in the lower pole left kidney. A second 1.5 cm mild heterogeneous mass is identified in the upper pole right kidney. There is a 3 mm nonobstructing stone in the lower pole left kidney. Hydronephrosis identified. The bladder is normal.   Stomach/Bowel: Stomach is within normal limits. The appendix is not seen but no inflammation is noted around cecum. No evidence of bowel wall thickening, distention, or inflammatory changes.   Vascular/Lymphatic: Aortic atherosclerosis. No enlarged abdominal or pelvic lymph nodes.   Reproductive: Uterus and bilateral adnexa are unremarkable. IUD is identified uterus.   Other: Minimal umbilical herniation of mesenteric fat is  identified.   Musculoskeletal: Grade 1 anterolisthesis of L5 on S1, degenerative.   IMPRESSION: 1. No acute abnormality identified in the abdomen and pelvis. 2. 3 cm heterogeneously enhancing mass in the lower pole left kidney. A second 1.5 cm mild heterogeneous mass is identified in the upper pole right kidney. The findings are suspicious for neoplasm. Further evaluation with MRI of the kidneys is recommended. 3. 3 mm nonobstructing stone in the lower pole left kidney.   Aortic Atherosclerosis (ICD10-I70.0).   Electronically Signed: By: Sherian Rein M.D. On: 01/22/2022 15:46  I have personally reviewed the images and I disagree the lower pole renal mass is on the right.   Assessment & Plan:    Renal mass, right x2 - A solid renal mass raises the suspicion of primary renal malignancy.  We discussed this in detail and in regards to the spectrum of renal masses which includes cysts (pure cysts are considered benign), solid masses and everything in between. The risk of metastasis increases as the size of solid renal mass increases. In general, it is believed that the risk of metastasis for renal masses less than 3-4 cm is small (up to approximately 5%) based mainly on large retrospective studies. In some cases and especially in patients of older age and multiple comorbidities a surveillance approach may be appropriate. The treatment of solid renal masses includes: surveillance, cryoablation (percutaneous and laparoscopic) in addition to partial and complete nephrectomy (each with option of laparoscopic, robotic and open depending on appropriateness). Furthermore, nephrectomy appears to be an independent risk factor for the development of chronic kidney disease suggesting that nephron sparing approaches should be implored whenever feasible. We reviewed these options in context of the patients current situation as well as the pros and cons of each. -Called radiology to addend report, both masses  in the same kidney on the right -Reviewed the imaging personally with the patient and her husband, it appears that the mass is likely been present since at least 2017 at which time it measured approximately 15 mm in the right lower pole however this was a noncontrasted study suggestive of more indolent course -Right upper pole is more questionable on this study, may or may not represent malignancy -Ultimately, as both lesions are malignant, would recommend right radical nephrectomy.  If the lower pole only is malignant, would recommend consideration of laparoscopic robotic assisted right partial nephrectomy.  We discussed these briefly today and she had lots  of questions. -Ultimately, she may need biopsy of 1 or both of her renal masses to which she is agreeable we did go ahead discussed the risk and benefits of this.  We will decide this pending MRI results.  2.  Left kidney stone -If we do ultimately proceed with radical nephrectomy, would recommend treatment of her left lower pole stone prior to this, discussed ESWL versus ureteroscopy.  She is favoring ureteroscopy.  3.  Obesity - We discussed today that if she ultimately does need surgery, would benefit from weight loss, ideally BMI 40 or less.  She is motivated to try to lose some weight.  Return in about 4 weeks (around 02/23/2022) for 1 mo w/MRI results.  Tawni Millers as a Neurosurgeon for Vanna Scotland, MD.,have documented all relevant documentation on the behalf of Vanna Scotland, MD,as directed by  Vanna Scotland, MD while in the presence of Vanna Scotland, MD.  I have reviewed the above documentation for accuracy and completeness, and I agree with the above.   Vanna Scotland, MD  [facility] [address] [place], [place] [address] [phone]  I spent 65 total minutes on the day of the encounter including pre-visit review of the medical record, face-to-face time with the patient, and  post visit ordering of labs/imaging/tests.

## 2022-01-27 MED ORDER — DIAZEPAM 10 MG PO TABS: 10.0000 mg | ORAL_TABLET | Freq: Once | ORAL | 0 refills | Status: AC

## 2022-01-29 ENCOUNTER — Other Ambulatory Visit: Payer: Self-pay | Admitting: *Deleted

## 2022-02-02 ENCOUNTER — Encounter: Payer: Self-pay | Admitting: Internal Medicine

## 2022-02-03 NOTE — Telephone Encounter (Signed)
Please notify - there should not be oral contrast with the MRI.  Continue to stay hydrated and follow recommendations per radiology regarding her medications.

## 2022-02-07 MED ORDER — DIAZEPAM 10 MG PO TABS: ORAL_TABLET | ORAL | 0 refills | Status: DC

## 2022-02-08 ENCOUNTER — Encounter: Payer: Self-pay | Admitting: Internal Medicine

## 2022-02-08 NOTE — Telephone Encounter (Signed)
Confirm no sob, chest pain.  If cough, congestion and blood tinged mucus - needs to be evaluated.  Recommend evaluation today.

## 2022-02-08 NOTE — Telephone Encounter (Signed)
S/w Leidi - advised recommend eval today at [facility] Pt agreeable will go tonight.

## 2022-02-08 NOTE — Telephone Encounter (Signed)
S/w Jenene - sx started last night. Coughing, some congestion in chest. Today feeling more chest congestion, has been coughing more, noticed blood in the mucus coughed up twice today.   Pt concerned due to blood in mucus.  No fever, sob , chest pain.

## 2022-02-12 ENCOUNTER — Other Ambulatory Visit: Payer: Self-pay | Admitting: Internal Medicine

## 2022-02-13 ENCOUNTER — Ambulatory Visit
Admission: RE | Admit: 2022-02-13 | Discharge: 2022-02-13 | Disposition: A | Discharge status: Home / Self Care | Payer: BC Managed Care – PPO | Source: Ambulatory Visit | Attending: Urology | Admitting: Urology

## 2022-02-13 DIAGNOSIS — N2889 Other specified disorders of kidney and ureter: Secondary | ICD-10-CM

## 2022-02-16 ENCOUNTER — Ambulatory Visit: Payer: BC Managed Care – PPO | Admitting: Internal Medicine

## 2022-02-20 ENCOUNTER — Ambulatory Visit: Payer: BC Managed Care – PPO | Admitting: Urology

## 2022-02-25 ENCOUNTER — Other Ambulatory Visit: Payer: Self-pay | Admitting: Internal Medicine

## 2022-03-08 ENCOUNTER — Encounter: Payer: Self-pay | Admitting: Internal Medicine

## 2022-03-09 ENCOUNTER — Encounter: Payer: Self-pay | Admitting: Internal Medicine

## 2022-03-09 ENCOUNTER — Telehealth: Payer: BC Managed Care – PPO | Admitting: Internal Medicine

## 2022-03-09 DIAGNOSIS — E1165 Type 2 diabetes mellitus with hyperglycemia: Secondary | ICD-10-CM

## 2022-03-09 DIAGNOSIS — E7800 Pure hypercholesterolemia, unspecified: Secondary | ICD-10-CM | POA: Diagnosis not present

## 2022-03-09 DIAGNOSIS — I10 Essential (primary) hypertension: Secondary | ICD-10-CM | POA: Diagnosis not present

## 2022-03-09 DIAGNOSIS — D75839 Thrombocytosis, unspecified: Secondary | ICD-10-CM

## 2022-03-09 DIAGNOSIS — D509 Iron deficiency anemia, unspecified: Secondary | ICD-10-CM

## 2022-03-09 DIAGNOSIS — N2889 Other specified disorders of kidney and ureter: Secondary | ICD-10-CM

## 2022-03-09 MED ORDER — AMLODIPINE BESYLATE 10 MG PO TABS: ORAL_TABLET | ORAL | 2 refills | Status: DC

## 2022-03-09 MED ORDER — METOPROLOL SUCCINATE ER 100 MG PO TB24: 50.0000 mg | ORAL_TABLET | Freq: Two times a day (BID) | ORAL | 2 refills | Status: DC

## 2022-03-09 NOTE — Progress Notes (Signed)
Patient ID: Lindsey Strickland, female   DOB: 12-Sep-1975, 46 y.o.   MRN: [id number]   Virtual Visit via video Note  All issues noted in this document were discussed and addressed.  No physical exam was performed (except for noted visual exam findings with Video Visits).   I connected with Lindsey Strickland by a video enabled telemedicine application and verified that I am speaking with the correct person using two identifiers. Location patient: home Location provider: work Persons participating in the virtual visit: patient, provider  The limitations, risks, security and privacy concerns of performing an evaluation and management service by video and the availability of in person appointments have been discussed.  It has also been discussed with the patient that there may be a patient responsible charge related to this service. The patient expressed understanding and agreed to proceed.   Reason for visit: follow up appt  HPI: Follow up regarding blood pressure ans sugars.  Wanted to discuss recent evaluation by urology - kidney mass. Discussed CT scan results.  Discussed recent urology appt with Dr Erlene Quan and Dr Thurmond Butts.  Discussed surgery - partial nephrectomy / nephrectomy.  Discussed importance of keep blood sugars and blood pressures under control.  No chest pain or sob reported.  No vomiting reported.  No increased cough and congestion.     ROS: See pertinent positives and negatives per HPI.  Past Medical History:  Diagnosis Date   Atypical endometrial hyperplasia    Diabetes mellitus ([place])    Diarrhea    Environmental allergies    Generalized anxiety disorder    GERD (gastroesophageal reflux disease)    Hypercholesterolemia    Hypertension    Insomnia    Major depressive disorder     Past Surgical History:  Procedure Laterality Date   COLONOSCOPY     COLONOSCOPY WITH PROPOFOL N/A 05/28/2018   Procedure: COLONOSCOPY WITH PROPOFOL;  Surgeon: Manya Silvas, MD;  Location:  [facility] ENDOSCOPY;  Service: Endoscopy;  Laterality: N/A;   ESOPHAGOGASTRODUODENOSCOPY (EGD) WITH PROPOFOL N/A 05/28/2018   Procedure: ESOPHAGOGASTRODUODENOSCOPY (EGD) WITH PROPOFOL;  Surgeon: Manya Silvas, MD;  Location: [facility] ENDOSCOPY;  Service: Endoscopy;  Laterality: N/A;   HYSTEROSCOPY WITH D & C     and polyp removal    Family History  Problem Relation Age of Onset   Hypertension Mother    Diabetes Mother    Diverticulosis Mother    Depression Mother    Hypercholesterolemia Father    Hypertension Father    Cancer Father    Heart disease Maternal Grandfather    Diverticulosis Paternal Grandmother    Hyperlipidemia Paternal Grandmother    Heart failure Paternal Grandmother    Cancer Maternal Grandmother    Alcoholism Paternal Uncle     SOCIAL HX: reviewed.    Current Outpatient Medications:    diazepam (VALIUM) 10 MG tablet, Take 1 tablet 1 dose 30 min prior to procedure, Disp: 1 tablet, Rfl: 0   albuterol (VENTOLIN HFA) 108 (90 Base) MCG/ACT inhaler, Inhale 2 puffs into the lungs every 6 (six) hours as needed for wheezing. Patient request ProAir brand only., Disp: 18 g, Rfl: 2   amLODipine (NORVASC) 10 MG tablet, Take 1/2 (one-half) tablet by mouth twice daily, Disp: 90 tablet, Rfl: 2   Cholecalciferol 10 MCG (400 UNIT) CHEW, Chew by mouth., Disp: , Rfl:    famotidine (PEPCID) 20 MG tablet, TAKE 2 TABLETS (40 MG) BY MOUTH NIGHTLY, Disp: , Rfl:    Fe Fum-FePoly-Vit C-Vit B3 (INTEGRA)  62.5-62.5-40-3 MG CAPS, One capsule per day, Disp: 30 capsule, Rfl: 2   fluticasone (FLONASE) 50 MCG/ACT nasal spray, Place 1 spray into both nostrils daily., Disp: 16 g, Rfl: 0   glucose blood test strip, Use as directed to check sugars BID. Dx E11.9, Disp: 100 each, Rfl: 12   Lancets MISC, by Does not apply route. Use 1 lancets three times a day, Disp: , Rfl:    levonorgestrel (MIRENA) 20 MCG/DAY IUD, by Intrauterine route., Disp: , Rfl:    loratadine (CLARITIN) 10 MG tablet, Take 10 mg by  mouth daily., Disp: , Rfl:    losartan (COZAAR) 100 MG tablet, Take 1 tablet by mouth once daily, Disp: 90 tablet, Rfl: 0   metFORMIN (GLUCOPHAGE) 500 MG tablet, Take 2 tablets by mouth twice daily, Disp: 360 tablet, Rfl: 0   metoprolol succinate (TOPROL-XL) 100 MG 24 hr tablet, Take 0.5 tablets (50 mg total) by mouth 2 (two) times daily. Take with or immediately following a meal., Disp: 90 tablet, Rfl: 2   pravastatin (PRAVACHOL) 40 MG tablet, TAKE 1 TABLET BY MOUTH ONCE DAILY . APPOINTMENT REQUIRED FOR FUTURE REFILLS, Disp: 90 tablet, Rfl: 0  EXAM:  GENERAL: alert, oriented, appears well and in no acute distress  HEENT: atraumatic, conjunttiva clear, no obvious abnormalities on inspection of external nose and ears  NECK: normal movements of the head and neck  LUNGS: on inspection no signs of respiratory distress, breathing rate appears normal, no obvious gross SOB, gasping or wheezing  CV: no obvious cyanosis  PSYCH/NEURO: pleasant and cooperative, no obvious depression or anxiety, speech and thought processing grossly intact  ASSESSMENT AND PLAN:  Discussed the following assessment and plan:  Problem List Items Addressed This Visit     Hypercholesterolemia    Continue pravastatin.  Diet and exercise.        Relevant Medications   metoprolol succinate (TOPROL-XL) 100 MG 24 hr tablet   amLODipine (NORVASC) 10 MG tablet   Other Relevant Orders   Hepatic function panel   Lipid panel   TSH   Hypertension    Continue losartan, metoprolol and amlodipine.  Follow pressures.  Follow metabolic panel. Unable to check today given virtual visit.  Follow pressures.       Relevant Medications   metoprolol succinate (TOPROL-XL) 100 MG 24 hr tablet   amLODipine (NORVASC) 10 MG tablet   Iron deficiency anemia    Has seen hematology.  Follow cbc and ferritin.       Relevant Orders   CBC with Differential/Platelet   IBC + Ferritin   Renal mass    Has met with Dr Erlene Quan and Dr  Thurmond Butts.  Discussed treatment/evaluation options.  Questions answered. Plans to f/u with urology.        Thrombocytosis ([place])    Has been evaluated by hematology.  Felt to be reactive.  Follow cbc.       Type 2 diabetes mellitus with hyperglycemia (HCC)    Low carb diet and exercise.  On metformin.  Follow met b and a1c.       Relevant Orders   Hemoglobin J2E   Basic metabolic panel    Return in about 8 weeks (around 05/04/2022) for follow up appt (69mn).   I discussed the assessment and treatment plan with the patient. The patient was provided an opportunity to ask questions and all were answered. The patient agreed with the plan and demonstrated an understanding of the instructions.   The patient was advised  to call back or seek an in-person evaluation if the symptoms worsen or if the condition fails to improve as anticipated.    Einar Pheasant, MD

## 2022-03-11 ENCOUNTER — Encounter: Payer: Self-pay | Admitting: Internal Medicine

## 2022-03-11 DIAGNOSIS — N2889 Other specified disorders of kidney and ureter: Secondary | ICD-10-CM | POA: Insufficient documentation

## 2022-03-11 NOTE — Assessment & Plan Note (Signed)
Continue pravastatin.  Diet and exercise.

## 2022-03-11 NOTE — Assessment & Plan Note (Signed)
Continue losartan, metoprolol and amlodipine.  Follow pressures.  Follow metabolic panel. Unable to check today given virtual visit.  Follow pressures.

## 2022-03-11 NOTE — Assessment & Plan Note (Signed)
Has met with Dr Erlene Quan and Dr Thurmond Butts.  Discussed treatment/evaluation options.  Questions answered. Plans to f/u with urology.

## 2022-03-11 NOTE — Assessment & Plan Note (Signed)
Has been evaluated by hematology.  Felt to be reactive.  Follow cbc.  

## 2022-03-11 NOTE — Assessment & Plan Note (Signed)
Low carb diet and exercise.  On metformin.  Follow met b and a1c.  

## 2022-03-11 NOTE — Assessment & Plan Note (Signed)
Has seen hematology.  Follow cbc and ferritin.  

## 2022-04-09 ENCOUNTER — Other Ambulatory Visit: Payer: Self-pay | Admitting: Internal Medicine

## 2022-04-27 ENCOUNTER — Other Ambulatory Visit: Payer: Self-pay | Admitting: Internal Medicine

## 2022-05-06 ENCOUNTER — Other Ambulatory Visit: Payer: Self-pay | Admitting: Internal Medicine

## 2022-05-11 ENCOUNTER — Other Ambulatory Visit: Payer: Self-pay | Admitting: Internal Medicine

## 2022-05-11 MED ORDER — LOSARTAN POTASSIUM 100 MG PO TABS: 100.0000 mg | ORAL_TABLET | Freq: Every day | ORAL | 0 refills | Status: DC

## 2022-05-27 ENCOUNTER — Other Ambulatory Visit: Payer: Self-pay | Admitting: Family

## 2022-05-29 ENCOUNTER — Other Ambulatory Visit: Payer: Self-pay | Admitting: Internal Medicine

## 2022-05-31 MED ORDER — PRAVASTATIN SODIUM 40 MG PO TABS: 40.0000 mg | ORAL_TABLET | Freq: Every day | ORAL | 0 refills | Status: DC

## 2022-06-01 ENCOUNTER — Ambulatory Visit: Payer: BC Managed Care – PPO | Admitting: Internal Medicine

## 2022-06-18 ENCOUNTER — Ambulatory Visit: Payer: BC Managed Care – PPO | Admitting: Internal Medicine

## 2022-06-18 ENCOUNTER — Encounter: Payer: Self-pay | Admitting: Internal Medicine

## 2022-06-18 NOTE — Progress Notes (Deleted)
Patient ID: Lindsey Strickland, female   DOB: Nov 03, 1975, 46 y.o.   MRN: [id number]   Subjective:    Patient ID: Lindsey Strickland, female    DOB: January 08, 1976, 46 y.o.   MRN: [id number]   Patient here for No chief complaint on file.  Marland Kitchen   HPI Here to f/u regarding her diabetes, hypertension and hypercholesterolemia.  Seeing Dr Thurmond Butts - kidney lesions.  Planning for surveillance imaging 08/2022.  Tentatively planning for surgery 09/2022 - right robotic retroperitoneal partial nephrectomy for multiple masses.    Past Medical History:  Diagnosis Date   Atypical endometrial hyperplasia    Diabetes mellitus ([place])    Diarrhea    Environmental allergies    Generalized anxiety disorder    GERD (gastroesophageal reflux disease)    Hypercholesterolemia    Hypertension    Insomnia    Major depressive disorder    Past Surgical History:  Procedure Laterality Date   COLONOSCOPY     COLONOSCOPY WITH PROPOFOL N/A 05/28/2018   Procedure: COLONOSCOPY WITH PROPOFOL;  Surgeon: Manya Silvas, MD;  Location: [facility] ENDOSCOPY;  Service: Endoscopy;  Laterality: N/A;   ESOPHAGOGASTRODUODENOSCOPY (EGD) WITH PROPOFOL N/A 05/28/2018   Procedure: ESOPHAGOGASTRODUODENOSCOPY (EGD) WITH PROPOFOL;  Surgeon: Manya Silvas, MD;  Location: [facility] ENDOSCOPY;  Service: Endoscopy;  Laterality: N/A;   HYSTEROSCOPY WITH D & C     and polyp removal   Family History  Problem Relation Age of Onset   Hypertension Mother    Diabetes Mother    Diverticulosis Mother    Depression Mother    Hypercholesterolemia Father    Hypertension Father    Cancer Father    Heart disease Maternal Grandfather    Diverticulosis Paternal Grandmother    Hyperlipidemia Paternal Grandmother    Heart failure Paternal Grandmother    Cancer Maternal Grandmother    Alcoholism Paternal Uncle    Social History   Socioeconomic History   Marital status: Single    Spouse name: Not on file   Number of children: 2   Years of education: Not  on file   Highest education level: High school graduate  Occupational History    Employer: [employer]  Tobacco Use   Smoking status: Former    Packs/day: 1.00    Types: Cigarettes    Quit date: 08/06/2000    Years since quitting: 21.8   Smokeless tobacco: Never  Vaping Use   Vaping Use: Never used  Substance and Sexual Activity   Alcohol use: Yes    Alcohol/week: 0.0 standard drinks of alcohol    Comment: rare   Drug use: No   Sexual activity: Yes    Birth control/protection: I.U.D.  Other Topics Concern   Not on file  Social History Narrative   Not on file   Social Determinants of Health   Financial Resource Strain: Low Risk  (08/05/2018)   Overall Financial Resource Strain (CARDIA)    Difficulty of Paying Living Expenses: Not hard at all  Food Insecurity: No Food Insecurity (08/05/2018)   Hunger Vital Sign    Worried About Running Out of Food in the Last Year: Never true    Ran Out of Food in the Last Year: Never true  Transportation Needs: No Transportation Needs (08/05/2018)   PRAPARE - Hydrologist (Medical): No    Lack of Transportation (Non-Medical): No  Physical Activity: Not on file  Stress: Not on file  Social Connections: Unknown (08/05/2018)  Social Licensed conveyancer [NHANES]    Frequency of Communication with Friends and Family: Not on file    Frequency of Social Gatherings with Friends and Family: Not on file    Attends Religious Services: 1 to 4 times per year    Active Member of [employer] or Organizations: No    Attends Archivist Meetings: Never    Marital Status: Living with partner     Review of Systems     Objective:     There were no vitals taken for this visit. Wt Readings from Last 3 Encounters:  01/26/22 292 lb (132.5 kg)  01/04/22 292 lb 12.8 oz (132.8 kg)  12/15/21 290 lb 9.6 oz (131.8 kg)    Physical Exam   Outpatient Encounter Medications as of 06/18/2022  Medication Sig    diazepam (VALIUM) 10 MG tablet Take 1 tablet 1 dose 30 min prior to procedure   albuterol (VENTOLIN HFA) 108 (90 Base) MCG/ACT inhaler Inhale 2 puffs into the lungs every 6 (six) hours as needed for wheezing. Patient request ProAir brand only.   amLODipine (NORVASC) 10 MG tablet Take 1/2 (one-half) tablet by mouth twice daily   Cholecalciferol 10 MCG (400 UNIT) CHEW Chew by mouth.   famotidine (PEPCID) 20 MG tablet TAKE 2 TABLETS (40 MG) BY MOUTH NIGHTLY   Fe Fum-FePoly-Vit C-Vit B3 (INTEGRA) 62.5-62.5-40-3 MG CAPS One capsule per day   fluticasone (FLONASE) 50 MCG/ACT nasal spray Use 1 spray(s) in each nostril once daily   glucose blood test strip Use as directed to check sugars BID. Dx E11.9   Lancets MISC by Does not apply route. Use 1 lancets three times a day   levonorgestrel (MIRENA) 20 MCG/DAY IUD by Intrauterine route.   loratadine (CLARITIN) 10 MG tablet Take 10 mg by mouth daily.   losartan (COZAAR) 100 MG tablet Take 1 tablet (100 mg total) by mouth daily.   metFORMIN (GLUCOPHAGE) 500 MG tablet Take 2 tablets by mouth twice daily   metoprolol succinate (TOPROL-XL) 100 MG 24 hr tablet Take 0.5 tablets (50 mg total) by mouth 2 (two) times daily. Take with or immediately following a meal.   pravastatin (PRAVACHOL) 40 MG tablet Take 1 tablet (40 mg total) by mouth daily.   No facility-administered encounter medications on file as of 06/18/2022.     Lab Results  Component Value Date   WBC 11.0 (H) 01/04/2022   HGB 11.3 (L) 01/04/2022   HCT 35.6 (L) 01/04/2022   PLT 424.0 (H) 01/04/2022   GLUCOSE 162 (H) 01/04/2022   CHOL 148 12/15/2021   TRIG 124.0 12/15/2021   HDL 37.80 (L) 12/15/2021   LDLDIRECT 132.0 11/02/2020   LDLCALC 85 12/15/2021   ALT 17 01/04/2022   AST 15 01/04/2022   NA 138 01/04/2022   K 4.6 01/04/2022   CL 99 01/04/2022   CREATININE 0.50 01/04/2022   BUN 15 01/04/2022   CO2 26 01/04/2022   TSH 3.32 06/15/2021   INR 0.93 05/05/2018   HGBA1C 7.2 (H)  12/15/2021   MICROALBUR 2.3 (H) 06/15/2021       Assessment & Plan:   Problem List Items Addressed This Visit   None    Einar Pheasant, MD

## 2022-06-26 ENCOUNTER — Other Ambulatory Visit: Payer: Self-pay | Admitting: Internal Medicine

## 2022-06-26 MED ORDER — PRAVASTATIN SODIUM 40 MG PO TABS: 40.0000 mg | ORAL_TABLET | Freq: Every day | ORAL | 0 refills | Status: DC

## 2022-07-08 ENCOUNTER — Other Ambulatory Visit: Payer: Self-pay | Admitting: Family

## 2022-07-09 ENCOUNTER — Other Ambulatory Visit: Payer: Self-pay | Admitting: Internal Medicine

## 2022-07-10 ENCOUNTER — Encounter: Payer: Self-pay | Admitting: Internal Medicine

## 2022-07-11 ENCOUNTER — Other Ambulatory Visit: Payer: Self-pay

## 2022-07-11 MED ORDER — METFORMIN HCL 500 MG PO TABS: 1000.0000 mg | ORAL_TABLET | Freq: Two times a day (BID) | ORAL | 0 refills | Status: DC

## 2022-07-16 ENCOUNTER — Ambulatory Visit: Payer: BC Managed Care – PPO | Admitting: Internal Medicine

## 2022-07-19 ENCOUNTER — Ambulatory Visit: Payer: BC Managed Care – PPO | Admitting: Internal Medicine

## 2022-07-19 ENCOUNTER — Encounter: Payer: Self-pay | Admitting: Internal Medicine

## 2022-07-19 VITALS — BP 140/72 | HR 100 | Temp 98.0°F | Resp 17 | Ht 68.0 in | Wt 302.0 lb

## 2022-07-19 DIAGNOSIS — R079 Chest pain, unspecified: Secondary | ICD-10-CM | POA: Diagnosis not present

## 2022-07-19 DIAGNOSIS — F439 Reaction to severe stress, unspecified: Secondary | ICD-10-CM

## 2022-07-19 DIAGNOSIS — N2889 Other specified disorders of kidney and ureter: Secondary | ICD-10-CM

## 2022-07-19 DIAGNOSIS — I10 Essential (primary) hypertension: Secondary | ICD-10-CM

## 2022-07-19 DIAGNOSIS — E1165 Type 2 diabetes mellitus with hyperglycemia: Secondary | ICD-10-CM | POA: Diagnosis not present

## 2022-07-19 DIAGNOSIS — D509 Iron deficiency anemia, unspecified: Secondary | ICD-10-CM

## 2022-07-19 DIAGNOSIS — K219 Gastro-esophageal reflux disease without esophagitis: Secondary | ICD-10-CM | POA: Diagnosis not present

## 2022-07-19 DIAGNOSIS — E7800 Pure hypercholesterolemia, unspecified: Secondary | ICD-10-CM

## 2022-07-19 DIAGNOSIS — D75839 Thrombocytosis, unspecified: Secondary | ICD-10-CM

## 2022-07-19 MED ORDER — METOPROLOL SUCCINATE ER 100 MG PO TB24: 50.0000 mg | ORAL_TABLET | Freq: Two times a day (BID) | ORAL | 2 refills | Status: DC

## 2022-07-19 MED ORDER — PRAVASTATIN SODIUM 40 MG PO TABS: 40.0000 mg | ORAL_TABLET | Freq: Every day | ORAL | 1 refills | Status: DC

## 2022-07-19 MED ORDER — AMLODIPINE BESYLATE 10 MG PO TABS: ORAL_TABLET | ORAL | 2 refills | Status: DC

## 2022-07-19 MED ORDER — LOSARTAN POTASSIUM 100 MG PO TABS: 100.0000 mg | ORAL_TABLET | Freq: Every day | ORAL | 1 refills | Status: DC

## 2022-07-24 ENCOUNTER — Other Ambulatory Visit (INDEPENDENT_AMBULATORY_CARE_PROVIDER_SITE_OTHER): Payer: BC Managed Care – PPO

## 2022-07-24 ENCOUNTER — Other Ambulatory Visit: Payer: Self-pay

## 2022-07-24 ENCOUNTER — Encounter: Payer: Self-pay | Admitting: Internal Medicine

## 2022-07-24 DIAGNOSIS — D509 Iron deficiency anemia, unspecified: Secondary | ICD-10-CM | POA: Diagnosis not present

## 2022-07-24 DIAGNOSIS — E1165 Type 2 diabetes mellitus with hyperglycemia: Secondary | ICD-10-CM

## 2022-07-24 DIAGNOSIS — E7800 Pure hypercholesterolemia, unspecified: Secondary | ICD-10-CM | POA: Diagnosis not present

## 2022-07-24 LAB — CBC WITH DIFFERENTIAL/PLATELET
Basophils Absolute: 0.1 10*3/uL (ref 0.0–0.1)
Basophils Relative: 0.7 % (ref 0.0–3.0)
Eosinophils Absolute: 0.2 10*3/uL (ref 0.0–0.7)
Eosinophils Relative: 1.9 % (ref 0.0–5.0)
HCT: 34 % — ABNORMAL LOW (ref 36.0–46.0)
Hemoglobin: 10.8 g/dL — ABNORMAL LOW (ref 12.0–15.0)
Lymphocytes Relative: 21.8 % (ref 12.0–46.0)
Lymphs Abs: 2.4 10*3/uL (ref 0.7–4.0)
MCHC: 31.6 g/dL (ref 30.0–36.0)
MCV: 79.5 fl (ref 78.0–100.0)
Monocytes Absolute: 0.7 10*3/uL (ref 0.1–1.0)
Monocytes Relative: 6.7 % (ref 3.0–12.0)
Neutro Abs: 7.6 10*3/uL (ref 1.4–7.7)
Neutrophils Relative %: 68.9 % (ref 43.0–77.0)
Platelets: 465 10*3/uL — ABNORMAL HIGH (ref 150.0–400.0)
RBC: 4.28 Mil/uL (ref 3.87–5.11)
RDW: 15.9 % — ABNORMAL HIGH (ref 11.5–15.5)
WBC: 11 10*3/uL — ABNORMAL HIGH (ref 4.0–10.5)

## 2022-07-24 LAB — IBC + FERRITIN
Ferritin: 5 ng/mL — ABNORMAL LOW (ref 10.0–291.0)
Iron: 50 ug/dL (ref 42–145)
Saturation Ratios: 9.6 % — ABNORMAL LOW (ref 20.0–50.0)
TIBC: 519.4 ug/dL — ABNORMAL HIGH (ref 250.0–450.0)
Transferrin: 371 mg/dL — ABNORMAL HIGH (ref 212.0–360.0)

## 2022-07-24 LAB — BASIC METABOLIC PANEL
BUN: 13 mg/dL (ref 6–23)
CO2: 26 mEq/L (ref 19–32)
Calcium: 9.2 mg/dL (ref 8.4–10.5)
Chloride: 100 mEq/L (ref 96–112)
Creatinine, Ser: 0.49 mg/dL (ref 0.40–1.20)
GFR: 114.98 mL/min (ref >60.00)
Glucose, Bld: 167 mg/dL — ABNORMAL HIGH (ref 70–99)
Potassium: 4.2 mEq/L (ref 3.5–5.1)
Sodium: 136 mEq/L (ref 135–145)

## 2022-07-24 LAB — LIPID PANEL
Cholesterol: 190 mg/dL (ref 0–200)
HDL: 50.2 mg/dL (ref >39.00)
LDL Cholesterol: 101 mg/dL — ABNORMAL HIGH (ref 0–99)
NonHDL: 139.88
Total CHOL/HDL Ratio: 4
Triglycerides: 194 mg/dL — ABNORMAL HIGH (ref 0.0–149.0)
VLDL: 38.8 mg/dL (ref 0.0–40.0)

## 2022-07-24 LAB — MICROALBUMIN / CREATININE URINE RATIO
Creatinine,U: 229.2 mg/dL
Microalb Creat Ratio: 5.8 mg/g (ref 0.0–30.0)
Microalb, Ur: 13.4 mg/dL — ABNORMAL HIGH (ref 0.0–1.9)

## 2022-07-24 LAB — HEPATIC FUNCTION PANEL
ALT: 22 U/L (ref 0–35)
AST: 17 U/L (ref 0–37)
Albumin: 4.2 g/dL (ref 3.5–5.2)
Alkaline Phosphatase: 61 U/L (ref 39–117)
Bilirubin, Direct: 0 mg/dL (ref 0.0–0.3)
Total Bilirubin: 0.3 mg/dL (ref 0.2–1.2)
Total Protein: 6.9 g/dL (ref 6.0–8.3)

## 2022-07-24 LAB — HEMOGLOBIN A1C: Hgb A1c MFr Bld: 7.7 % — ABNORMAL HIGH (ref 4.6–6.5)

## 2022-07-24 LAB — TSH: TSH: 5.06 u[IU]/mL (ref 0.35–5.50)

## 2022-08-02 ENCOUNTER — Other Ambulatory Visit: Payer: BC Managed Care – PPO

## 2022-08-02 ENCOUNTER — Encounter: Payer: Self-pay | Admitting: Internal Medicine

## 2022-08-02 NOTE — Assessment & Plan Note (Signed)
Has met with Dr Erlene Quan and Dr Thurmond Butts.  Discussed treatment/evaluation options.  Planning f/u CT renal 08/14/22 - f/u Dr Thurmond Butts 08/16/22.

## 2022-08-02 NOTE — Assessment & Plan Note (Signed)
On prilosec.  Continue.

## 2022-08-02 NOTE — Assessment & Plan Note (Signed)
Increased stress.  Discussed.  Seeing her therapist.  Appt today.

## 2022-08-02 NOTE — Progress Notes (Signed)
Subjective:    Patient ID: Lindsey Strickland, female    DOB: May 21, 1976, 46 y.o.   MRN: [id number]  Patient here for  Chief Complaint  Patient presents with   Medical Management of Chronic Issues    HPI Here to follow up regarding diabetes, hypertension and hypercholesterolemia.  Being followed by urology - renal mass.  Tentatively scheduled for robotic lap - partial nephrectomy.  Had questions regarding CT - atherosclerosis.  Discussed diet, exercise and weight loss.  Discuss importance of treating cholesterol and sugars - better control.  Increased stress.  Family stress.  Discussed.  Has f/u with her therapist today.  Has not been watching diet as well.  Binges - eating crackers and chips.  Discussed chest symptoms.  No increased sob.  Does report - feeling of not getting a good breath in. Discussed further w/upp - PFTs etc.  Some chest pain.  Discussed referral to cardiology.     Past Medical History:  Diagnosis Date   Atypical endometrial hyperplasia    Diabetes mellitus ([place])    Diarrhea    Environmental allergies    Generalized anxiety disorder    GERD (gastroesophageal reflux disease)    Hypercholesterolemia    Hypertension    Insomnia    Major depressive disorder    Past Surgical History:  Procedure Laterality Date   COLONOSCOPY     COLONOSCOPY WITH PROPOFOL N/A 05/28/2018   Procedure: COLONOSCOPY WITH PROPOFOL;  Surgeon: Manya Silvas, MD;  Location: [facility] ENDOSCOPY;  Service: Endoscopy;  Laterality: N/A;   ESOPHAGOGASTRODUODENOSCOPY (EGD) WITH PROPOFOL N/A 05/28/2018   Procedure: ESOPHAGOGASTRODUODENOSCOPY (EGD) WITH PROPOFOL;  Surgeon: Manya Silvas, MD;  Location: [facility] ENDOSCOPY;  Service: Endoscopy;  Laterality: N/A;   HYSTEROSCOPY WITH D & C     and polyp removal   Family History  Problem Relation Age of Onset   Hypertension Mother    Diabetes Mother    Diverticulosis Mother    Depression Mother    Hypercholesterolemia Father    Hypertension Father     Cancer Father    Heart disease Maternal Grandfather    Diverticulosis Paternal Grandmother    Hyperlipidemia Paternal Grandmother    Heart failure Paternal Grandmother    Cancer Maternal Grandmother    Alcoholism Paternal Uncle    Social History   Socioeconomic History   Marital status: Single    Spouse name: Not on file   Number of children: 2   Years of education: Not on file   Highest education level: High school graduate  Occupational History    Employer: [employer]  Tobacco Use   Smoking status: Former    Packs/day: 1.00    Types: Cigarettes    Quit date: 08/06/2000    Years since quitting: 22.0   Smokeless tobacco: Never  Vaping Use   Vaping Use: Never used  Substance and Sexual Activity   Alcohol use: Yes    Alcohol/week: 0.0 standard drinks of alcohol    Comment: rare   Drug use: No   Sexual activity: Yes    Birth control/protection: I.U.D.  Other Topics Concern   Not on file  Social History Narrative   Not on file   Social Determinants of Health   Financial Resource Strain: Low Risk  (08/05/2018)   Overall Financial Resource Strain (CARDIA)    Difficulty of Paying Living Expenses: Not hard at all  Food Insecurity: No Food Insecurity (08/05/2018)   Hunger Vital Sign    Worried  About Running Out of Food in the Last Year: Never true    Ran Out of Food in the Last Year: Never true  Transportation Needs: No Transportation Needs (08/05/2018)   PRAPARE - Hydrologist (Medical): No    Lack of Transportation (Non-Medical): No  Physical Activity: Not on file  Stress: Not on file  Social Connections: Unknown (08/05/2018)   Social Connection and Isolation Panel [NHANES]    Frequency of Communication with Friends and Family: Not on file    Frequency of Social Gatherings with Friends and Family: Not on file    Attends Religious Services: 1 to 4 times per year    Active Member of [employer] or Organizations: No    Attends Theatre manager Meetings: Never    Marital Status: Living with partner     Review of Systems  Constitutional:  Negative for appetite change and fever.       Not watching diet as well.   HENT:  Negative for congestion and sinus pressure.   Respiratory:  Negative for cough and chest tightness.        Breathing as outlined.   Cardiovascular:  Negative for palpitations and leg swelling.  Gastrointestinal:  Negative for diarrhea, nausea and vomiting.  Genitourinary:  Negative for difficulty urinating and dysuria.  Musculoskeletal:  Negative for joint swelling and myalgias.  Skin:  Negative for color change and rash.  Neurological:  Negative for dizziness and headaches.  Psychiatric/Behavioral:  Negative for agitation.        Increased stress as outlined.        Objective:     BP (!) 140/72   Pulse 100   Temp 98 F (36.7 C) (Temporal)   Resp 17   Ht _0  (1.727 m)   Wt (!) 302 lb (137 kg)   SpO2 99%   BMI 45.92 kg/m  Wt Readings from Last 3 Encounters:  07/19/22 (!) 302 lb (137 kg)  01/26/22 292 lb (132.5 kg)  01/04/22 292 lb 12.8 oz (132.8 kg)    Physical Exam Constitutional:      General: She is not in acute distress.    Appearance: Normal appearance.  HENT:     Head: Normocephalic and atraumatic.     Nose: Nose normal. No congestion.  Neck:     Thyroid: No thyromegaly.  Cardiovascular:     Rate and Rhythm: Normal rate and regular rhythm.  Pulmonary:     Effort: No respiratory distress.     Breath sounds: Normal breath sounds. No wheezing.  Abdominal:     General: Bowel sounds are normal.     Palpations: Abdomen is soft.     Tenderness: There is no abdominal tenderness.  Musculoskeletal:        General: No tenderness.     Cervical back: Neck supple. No tenderness.     Comments: No increased swelling.   Lymphadenopathy:     Cervical: No cervical adenopathy.  Skin:    Findings: No erythema or rash.  Neurological:     Mental Status: She is alert.       Outpatient Encounter Medications as of 07/19/2022  Medication Sig   albuterol (VENTOLIN HFA) 108 (90 Base) MCG/ACT inhaler Inhale 2 puffs into the lungs every 6 (six) hours as needed for wheezing. Patient request ProAir brand only.   Cholecalciferol 10 MCG (400 UNIT) CHEW Chew by mouth.   famotidine (PEPCID) 20 MG tablet TAKE 2 TABLETS (40 MG) BY  MOUTH NIGHTLY   Fe Fum-FePoly-Vit C-Vit B3 (INTEGRA) 62.5-62.5-40-3 MG CAPS One capsule per day   fluticasone (FLONASE) 50 MCG/ACT nasal spray Use 1 spray(s) in each nostril once daily   glucose blood test strip Use as directed to check sugars BID. Dx E11.9   Lancets MISC by Does not apply route. Use 1 lancets three times a day   levonorgestrel (MIRENA) 20 MCG/DAY IUD by Intrauterine route.   loratadine (CLARITIN) 10 MG tablet Take 10 mg by mouth daily.   metFORMIN (GLUCOPHAGE) 500 MG tablet Take 2 tablets (1,000 mg total) by mouth 2 (two) times daily.   [DISCONTINUED] amLODipine (NORVASC) 10 MG tablet Take 1/2 (one-half) tablet by mouth twice daily   [DISCONTINUED] losartan (COZAAR) 100 MG tablet Take 1 tablet (100 mg total) by mouth daily.   [DISCONTINUED] metoprolol succinate (TOPROL-XL) 100 MG 24 hr tablet Take 0.5 tablets (50 mg total) by mouth 2 (two) times daily. Take with or immediately following a meal.   [DISCONTINUED] pravastatin (PRAVACHOL) 40 MG tablet Take 1 tablet (40 mg total) by mouth daily.   amLODipine (NORVASC) 10 MG tablet Take 1/2 (one-half) tablet by mouth twice daily   losartan (COZAAR) 100 MG tablet Take 1 tablet (100 mg total) by mouth daily.   metoprolol succinate (TOPROL-XL) 100 MG 24 hr tablet Take 0.5 tablets (50 mg total) by mouth 2 (two) times daily. Take with or immediately following a meal.   pravastatin (PRAVACHOL) 40 MG tablet Take 1 tablet (40 mg total) by mouth daily.   [DISCONTINUED] diazepam (VALIUM) 10 MG tablet Take 1 tablet 1 dose 30 min prior to procedure   No facility-administered encounter  medications on file as of 07/19/2022.     Lab Results  Component Value Date   WBC 11.0 (H) 07/24/2022   HGB 10.8 (L) 07/24/2022   HCT 34.0 (L) 07/24/2022   PLT 465.0 (H) 07/24/2022   GLUCOSE 167 (H) 07/24/2022   CHOL 190 07/24/2022   TRIG 194.0 (H) 07/24/2022   HDL 50.20 07/24/2022   LDLDIRECT 132.0 11/02/2020   LDLCALC 101 (H) 07/24/2022   ALT 22 07/24/2022   AST 17 07/24/2022   NA 136 07/24/2022   K 4.2 07/24/2022   CL 100 07/24/2022   CREATININE 0.49 07/24/2022   BUN 13 07/24/2022   CO2 26 07/24/2022   TSH 5.06 07/24/2022   INR 0.93 05/05/2018   HGBA1C 7.7 (H) 07/24/2022   MICROALBUR 13.4 (H) 07/24/2022       Assessment & Plan:  Type 2 diabetes mellitus with hyperglycemia, without long-term current use of insulin ([place]) Assessment & Plan: On metformin.  a1c last check - improved - 7.3.  Discussed other treatment options.  Wants to hold on additional medication.  Low carb diet and exercise.  Follow met b and a1c. Sugars have not been well controlled.  Has not been watching diet.  Discussed importance of getting sugars under control.  Discussed medications.  Has desired not to add medications.  Overdue labs.  Check met b and A1c.   Orders: -     Microalbumin / creatinine urine ratio -     Ambulatory referral to Cardiology  Chest pain, unspecified type Assessment & Plan: Describes a feeling of not being able to get a good breath - at times.  Discussed CT scan - atherosclerosis.  Discussed risk factors - including diabetes and cholesterol.  Given above, refer to cardiology for further evaluation.    Orders: -     Ambulatory referral to  Cardiology  Gastroesophageal reflux disease without esophagitis Assessment & Plan: On prilosec.  Continue.     Hypercholesterolemia Assessment & Plan: Continue pravastatin.  Diet and exercise.  Check lipid panel and liver function tests.   Orders: -     Ambulatory referral to Cardiology  Primary hypertension Assessment &  Plan: Continue losartan, metoprolol and amlodipine.  Follow pressures.  Follow metabolic panel. Blood pressure elevated above goal.  Discussed need to adjust medication - especially given persistent elevation.  Send in readings.    Orders: -     Ambulatory referral to Cardiology  Iron deficiency anemia, unspecified iron deficiency anemia type Assessment & Plan: Has seen hematology.  Follow cbc and ferritin.    Renal mass Assessment & Plan: Has met with Dr Erlene Quan and Dr Thurmond Butts.  Discussed treatment/evaluation options.  Planning f/u CT renal 08/14/22 - f/u Dr Thurmond Butts 08/16/22.    Severe obesity (BMI >= 40) ([facility]) Assessment & Plan: Discussed diet and exercise.  Follow.  Discussed GLP - 1 agonists to treat diabetes and help with weight loss.  Has had GI issues. Wants to hold on medication.  Follow.    Stress Assessment & Plan: Increased stress.  Discussed.  Seeing her therapist.  Appt today.    Thrombocytosis ([place]) Assessment & Plan: Has been evaluated by hematology.  Felt to be reactive.  Follow cbc.    Other orders -     amLODIPine Besylate; Take 1/2 (one-half) tablet by mouth twice daily  Dispense: 90 tablet; Refill: 2 -     Losartan Potassium; Take 1 tablet (100 mg total) by mouth daily.  Dispense: 90 tablet; Refill: 1 -     Metoprolol Succinate ER; Take 0.5 tablets (50 mg total) by mouth 2 (two) times daily. Take with or immediately following a meal.  Dispense: 90 tablet; Refill: 2 -     Pravastatin Sodium; Take 1 tablet (40 mg total) by mouth daily.  Dispense: 90 tablet; Refill: 1     Einar Pheasant, MD

## 2022-08-02 NOTE — Assessment & Plan Note (Signed)
On metformin.  a1c last check - improved - 7.3.  Discussed other treatment options.  Wants to hold on additional medication.  Low carb diet and exercise.  Follow met b and a1c. Sugars have not been well controlled.  Has not been watching diet.  Discussed importance of getting sugars under control.  Discussed medications.  Has desired not to add medications.  Overdue labs.  Check met b and A1c.

## 2022-08-02 NOTE — Assessment & Plan Note (Addendum)
Continue pravastatin.  Diet and exercise.  Check lipid panel and liver function tests.

## 2022-08-02 NOTE — Assessment & Plan Note (Signed)
Has seen hematology.  Follow cbc and ferritin.

## 2022-08-02 NOTE — Assessment & Plan Note (Signed)
Continue losartan, metoprolol and amlodipine.  Follow pressures.  Follow metabolic panel. Blood pressure elevated above goal.  Discussed need to adjust medication - especially given persistent elevation.  Send in readings.

## 2022-08-02 NOTE — Assessment & Plan Note (Signed)
Describes a feeling of not being able to get a good breath - at times.  Discussed CT scan - atherosclerosis.  Discussed risk factors - including diabetes and cholesterol.  Given above, refer to cardiology for further evaluation.

## 2022-08-02 NOTE — Assessment & Plan Note (Signed)
Has been evaluated by hematology.  Felt to be reactive.  Follow cbc.

## 2022-08-02 NOTE — Assessment & Plan Note (Signed)
Low carb diet and exercise.  On metformin.  Have discussed other treatment options as outlined.  Has declined to start.  Follow met b and a1c.

## 2022-08-02 NOTE — Assessment & Plan Note (Signed)
Discussed diet and exercise.  Follow.  Discussed GLP - 1 agonists to treat diabetes and help with weight loss.  Has had GI issues. Wants to hold on medication.  Follow.

## 2022-08-14 ENCOUNTER — Encounter: Payer: Self-pay | Admitting: Internal Medicine

## 2022-08-15 NOTE — Telephone Encounter (Signed)
Noted. Unable to see CT.  Thank her for the update. Keep [place] posted.

## 2022-09-04 ENCOUNTER — Encounter: Payer: Self-pay | Admitting: Internal Medicine

## 2022-09-04 MED ORDER — OSELTAMIVIR PHOSPHATE 75 MG PO CAPS: ORAL_CAPSULE | ORAL | 0 refills | Status: DC

## 2022-09-04 NOTE — Telephone Encounter (Signed)
I have sent in the prescription for tamiflu.  Take one per day for 10 days - preventive dose.  If symptoms, increase to bid and complete 5 day course (as we discussed)

## 2022-09-04 NOTE — Telephone Encounter (Signed)
S/w pt - ok to wait until visit scheduled for Thursday. Stated feeling fine, no sx of flu : no fever, bodyaches, congestion, sore throat, chills, etc.  As for chest tightness - pt has not used her inhaler today, thinks that is the cause. Will use inhaler and see if that helps. Pt does not think it is cause for concern right now.  Pt advised if tightness does not get better, to call.

## 2022-09-04 NOTE — Telephone Encounter (Signed)
Pt advised as discussed

## 2022-09-06 ENCOUNTER — Ambulatory Visit: Payer: BC Managed Care – PPO | Admitting: Internal Medicine

## 2022-09-06 ENCOUNTER — Telehealth: Payer: BC Managed Care – PPO | Admitting: Internal Medicine

## 2022-09-06 VITALS — Ht 68.0 in | Wt 302.0 lb

## 2022-09-06 DIAGNOSIS — E1165 Type 2 diabetes mellitus with hyperglycemia: Secondary | ICD-10-CM

## 2022-09-06 DIAGNOSIS — D75839 Thrombocytosis, unspecified: Secondary | ICD-10-CM

## 2022-09-06 DIAGNOSIS — D509 Iron deficiency anemia, unspecified: Secondary | ICD-10-CM

## 2022-09-06 DIAGNOSIS — K219 Gastro-esophageal reflux disease without esophagitis: Secondary | ICD-10-CM

## 2022-09-06 DIAGNOSIS — I10 Essential (primary) hypertension: Secondary | ICD-10-CM | POA: Diagnosis not present

## 2022-09-06 DIAGNOSIS — E7800 Pure hypercholesterolemia, unspecified: Secondary | ICD-10-CM

## 2022-09-06 DIAGNOSIS — F439 Reaction to severe stress, unspecified: Secondary | ICD-10-CM

## 2022-09-06 DIAGNOSIS — N2889 Other specified disorders of kidney and ureter: Secondary | ICD-10-CM

## 2022-09-06 DIAGNOSIS — Z20828 Contact with and (suspected) exposure to other viral communicable diseases: Secondary | ICD-10-CM

## 2022-09-06 NOTE — Progress Notes (Signed)
Patient ID: Lindsey Strickland, female   DOB: 11-26-1975, 47 y.o.   MRN: [id number]   Virtual Visit via video Note  All issues noted in this document were discussed and addressed.  No physical exam was performed (except for noted visual exam findings with Video Visits).   I connected with Lindsey Strickland by a video enabled telemedicine application and verified that I am speaking with the correct person using two identifiers. Location patient: home Location provider: work  Persons participating in the virtual visit: patient, provider  the limitations, risks, security and privacy concerns of performing an evaluation and management service by video and the availability of in person appointments have beend discussed.  It has also been discussed with the patient that there may be a patient responsible charge related to this service. The patient expressed understanding and agreed to proceed.   Reason for visit: follow up appt.   HPI: Scheduled for f/u - hypertension, hypercholesterolemia and diabetes.  She changed her appt to a virtual - husband was diagnosed with flu.  She is currently on tamiflu - prophylaxis.  Currently without increased congestion, etc.  Reports persistent increased stress.  Seeing her therapist one time per week.  Play doh therapy.  Does not feel needs any further intervention at this time.  Seeing Dr Thurmond Butts for f/u renal mass.  Planning for robotic laparoscopy partial nephrectomy in 10/2022.  States blood sugars - highest - 170s.  Off metformin.  She has cut out chips and cereal.  Trying to watch her diet.  Discussed diet and exercise.  Started taking integra - mid January.  No abdominal pain or bowel change reported.    ROS: See pertinent positives and negatives per HPI.  Past Medical History:  Diagnosis Date   Atypical endometrial hyperplasia    Diabetes mellitus ([place])    Diarrhea    Environmental allergies    Generalized anxiety disorder    GERD (gastroesophageal reflux  disease)    Hypercholesterolemia    Hypertension    Insomnia    Major depressive disorder     Past Surgical History:  Procedure Laterality Date   COLONOSCOPY     COLONOSCOPY WITH PROPOFOL N/A 05/28/2018   Procedure: COLONOSCOPY WITH PROPOFOL;  Surgeon: Manya Silvas, MD;  [facility] ENDOSCOPY;  Service: Endoscopy;  Laterality: N/A;   ESOPHAGOGASTRODUODENOSCOPY (EGD) WITH PROPOFOL N/A 05/28/2018   Procedure: ESOPHAGOGASTRODUODENOSCOPY (EGD) WITH PROPOFOL;  Surgeon: Manya Silvas, MD;  Location: [facility] ENDOSCOPY;  Service: Endoscopy;  Laterality: N/A;   HYSTEROSCOPY WITH D & C     and polyp removal    Family History  Problem Relation Age of Onset   Hypertension Mother    Diabetes Mother    Diverticulosis Mother    Depression Mother    Hypercholesterolemia Father    Hypertension Father    Cancer Father    Heart disease Maternal Grandfather    Diverticulosis Paternal Grandmother    Hyperlipidemia Paternal Grandmother    Heart failure Paternal Grandmother    Cancer Maternal Grandmother    Alcoholism Paternal Uncle     SOCIAL HX: reviewed.    Current Outpatient Medications:    albuterol (VENTOLIN HFA) 108 (90 Base) MCG/ACT inhaler, Inhale 2 puffs into the lungs every 6 (six) hours as needed for wheezing. Patient request ProAir brand only., Disp: 18 g, Rfl: 2   amLODipine (NORVASC) 10 MG tablet, Take 1/2 (one-half) tablet by mouth twice daily, Disp: 90 tablet, Rfl: 2   Cholecalciferol 10 MCG (400  UNIT) CHEW, Chew by mouth., Disp: , Rfl:    famotidine (PEPCID) 20 MG tablet, TAKE 2 TABLETS (40 MG) BY MOUTH NIGHTLY, Disp: , Rfl:    Fe Fum-FePoly-Vit C-Vit B3 (INTEGRA) 62.5-62.5-40-3 MG CAPS, One capsule per day, Disp: 30 capsule, Rfl: 2   fluticasone (FLONASE) 50 MCG/ACT nasal spray, Use 1 spray(s) in each nostril once daily, Disp: 16 g, Rfl: 0   glucose blood test strip, Use as directed to check sugars BID. Dx E11.9, Disp: 100 each, Rfl: 12   Lancets MISC, by Does not  apply route. Use 1 lancets three times a day, Disp: , Rfl:    levonorgestrel (MIRENA) 20 MCG/DAY IUD, by Intrauterine route., Disp: , Rfl:    loratadine (CLARITIN) 10 MG tablet, Take 10 mg by mouth daily., Disp: , Rfl:    losartan (COZAAR) 100 MG tablet, Take 1 tablet (100 mg total) by mouth daily., Disp: 90 tablet, Rfl: 1   metFORMIN (GLUCOPHAGE) 500 MG tablet, Take 2 tablets (1,000 mg total) by mouth 2 (two) times daily., Disp: 360 tablet, Rfl: 0   metoprolol succinate (TOPROL-XL) 100 MG 24 hr tablet, Take 0.5 tablets (50 mg total) by mouth 2 (two) times daily. Take with or immediately following a meal., Disp: 90 tablet, Rfl: 2   oseltamivir (TAMIFLU) 75 MG capsule, Take one tablet q day for 10 days.  If symptoms, increase to bid and complete 5 day course., Disp: 10 capsule, Rfl: 0   pravastatin (PRAVACHOL) 40 MG tablet, Take 1 tablet (40 mg total) by mouth daily., Disp: 90 tablet, Rfl: 1  EXAM:  GENERAL: alert, oriented, appears well and in no acute distress  HEENT: atraumatic, conjunttiva clear, no obvious abnormalities on inspection of external nose and ears  NECK: normal movements of the head and neck  LUNGS: on inspection no signs of respiratory distress, breathing rate appears normal, no obvious gross SOB, gasping or wheezing  CV: no obvious cyanosis  PSYCH/NEURO: pleasant and cooperative, no obvious depression or anxiety, speech and thought processing grossly intact  ASSESSMENT AND PLAN:  Discussed the following assessment and plan:  Problem List Items Addressed This Visit     Diabetes mellitus ([place]) - Primary    Off metformin.  a1c last check - 7.7.  have discussed treatment options.  Wanted to hold on additional medication.  Low carb diet and exercise.  Follow met b and a1c. Sugars have not been well controlled.  Has been adjusting diet as outlined. Discussed importance of getting sugars under control.  Discussed medications.  Has desired not to add medications.  Follow met b  and A1c.       Exposure to the flu    Husband with the flu.  On prophylaxis tamiflu.  Follow.  Currently doing well.       GERD (gastroesophageal reflux disease)    On prilosec.  Continue.        Hypercholesterolemia    Continue pravastatin.  Diet and exercise.  Check lipid panel and liver function tests. Had previously discussed changing to crestor.  Had wanted to hold on change.  Follow.       Hypertension    Continue losartan, metoprolol and amlodipine.  Follow pressures.  Follow metabolic panel. Have discussed need to adjust medication. Send in readings.        Iron deficiency anemia    Has seen hematology.  Follow cbc and ferritin. Taking [place].       Renal mass    Planning for right  robotic retroperitoneal partial nephrectomy [facility] - Dr Thurmond Butts (urology)).       Severe obesity (BMI >= 40) ([facility])    Discussed diet and exercise.  She had adjusted her diet as outlined.  Follow.       Stress    Increased stress.  Discussed.  Seeing her therapist.        Thrombocytosis ([place])    Has been evaluated by hematology.  Felt to be reactive.  Follow cbc.        Return in about 2 months (around 11/05/2022) for follow up appt (48mn).   I discussed the assessment and treatment plan with the patient. The patient was provided an opportunity to ask questions and all were answered. The patient agreed with the plan and demonstrated an understanding of the instructions.   The patient was advised to call back or seek an in-person evaluation if the symptoms worsen or if the condition fails to improve as anticipated.   CEinar Pheasant MD

## 2022-09-09 ENCOUNTER — Encounter: Payer: Self-pay | Admitting: Internal Medicine

## 2022-09-09 DIAGNOSIS — Z20828 Contact with and (suspected) exposure to other viral communicable diseases: Secondary | ICD-10-CM | POA: Insufficient documentation

## 2022-09-09 NOTE — Assessment & Plan Note (Signed)
Has been evaluated by hematology.  Felt to be reactive.  Follow cbc.

## 2022-09-09 NOTE — Assessment & Plan Note (Signed)
Has seen hematology.  Follow cbc and ferritin. Taking [place].

## 2022-09-09 NOTE — Assessment & Plan Note (Addendum)
Continue losartan, metoprolol and amlodipine.  Follow pressures.  Follow metabolic panel. Have discussed need to adjust medication. Send in readings.

## 2022-09-09 NOTE — Assessment & Plan Note (Signed)
On prilosec.  Continue.

## 2022-09-09 NOTE — Assessment & Plan Note (Signed)
Increased stress.  Discussed.  Seeing her therapist.

## 2022-09-09 NOTE — Assessment & Plan Note (Addendum)
Off metformin.  a1c last check - 7.7.  have discussed treatment options.  Wanted to hold on additional medication.  Low carb diet and exercise.  Follow met b and a1c. Sugars have not been well controlled.  Has been adjusting diet as outlined. Discussed importance of getting sugars under control.  Discussed medications.  Has desired not to add medications.  Follow met b and A1c.

## 2022-09-09 NOTE — Assessment & Plan Note (Signed)
Husband with the flu.  On prophylaxis tamiflu.  Follow.  Currently doing well.

## 2022-09-09 NOTE — Assessment & Plan Note (Signed)
Discussed diet and exercise.  She had adjusted her diet as outlined.  Follow.

## 2022-09-09 NOTE — Assessment & Plan Note (Signed)
Planning for right robotic retroperitoneal partial nephrectomy [facility] - Dr Thurmond Butts (urology)).

## 2022-09-09 NOTE — Assessment & Plan Note (Signed)
Continue pravastatin.  Diet and exercise.  Check lipid panel and liver function tests. Had previously discussed changing to crestor.  Had wanted to hold on change.  Follow.

## 2022-09-11 ENCOUNTER — Encounter: Payer: Self-pay | Admitting: Internal Medicine

## 2022-09-11 DIAGNOSIS — N926 Irregular menstruation, unspecified: Secondary | ICD-10-CM | POA: Insufficient documentation

## 2022-10-05 ENCOUNTER — Encounter: Payer: Self-pay | Admitting: Internal Medicine

## 2022-10-05 NOTE — Telephone Encounter (Signed)
If she is just noticing blood in urine and no other issues, there should not be an issue with her doing her stress test.  I would have her confirm with cardiology as well.

## 2022-10-07 ENCOUNTER — Other Ambulatory Visit: Payer: Self-pay | Admitting: Internal Medicine

## 2022-10-10 ENCOUNTER — Other Ambulatory Visit: Payer: Self-pay | Admitting: Internal Medicine

## 2022-10-10 DIAGNOSIS — I2510 Atherosclerotic heart disease of native coronary artery without angina pectoris: Secondary | ICD-10-CM

## 2022-10-26 ENCOUNTER — Telehealth: Payer: Self-pay

## 2022-10-26 NOTE — Transitions of Care (Post Inpatient/ED Visit) (Signed)
   10/26/2022  Name: CHRISTE BOTT MRN: [id number] DOB: 1975/09/24  Today's TOC FU Call Status: Today's TOC FU Call Status:: Successful TOC FU Call Competed TOC FU Call Complete Date: 10/26/22  Transition Care Management Follow-up Telephone Call Date of Discharge: 10/25/22 Discharge Facility: Other ([place]) Name of Other (Non[facility]) Discharge Facility: [facility] Type of Discharge: Inpatient Admission Primary Inpatient Discharge Diagnosis:: renal mass How have you been since you were released from the hospital?: Better Any questions or concerns?: No  Items Reviewed: Did you receive and understand the discharge instructions provided?: Yes Medications obtained and verified?: Yes (Medications Reviewed) Any new allergies since your discharge?: No Dietary orders reviewed?: Yes Do you have support at home?: Yes People in Home: significant other  Home Care and Equipment/Supplies: [place] Ordered?: NA Any new equipment or medical supplies ordered?: NA  Functional Questionnaire: Do you need assistance with bathing/showering or dressing?: Yes Do you need assistance with meal preparation?: No Do you need assistance with eating?: No Do you have difficulty maintaining continence: No Do you need assistance with getting out of bed/getting out of a chair/moving?: No Do you have difficulty managing or taking your medications?: No  Follow up appointments reviewed: PCP Follow-up appointment confirmed?: [facility] Follow-up appointment confirmed?: Yes Date of Specialist follow-up appointment?: 11/27/22 Follow-Up Specialty Provider:: [facility] Do you need transportation to your follow-up appointment?: No Do you understand care options if your condition(s) worsen?: Yes-patient verbalized understanding    SIGNATURE Juanda Crumble, [place] Nurse Health Advisor Direct Dial [phone]

## 2022-11-19 ENCOUNTER — Other Ambulatory Visit: Payer: Self-pay | Admitting: Internal Medicine

## 2022-11-24 ENCOUNTER — Encounter: Payer: Self-pay | Admitting: Internal Medicine

## 2022-11-26 NOTE — Telephone Encounter (Signed)
Patient states she is returning a call from Rita Ohara, LPN.  I was unable to reach [place] at the time of the call, so I let patient know that I will send her a message so she can call her back.

## 2022-11-26 NOTE — Telephone Encounter (Signed)
Patient says she is getting better. Wanted to know if work in appt this week would replace her f/u next week. Offered her appt Thursday- patient was unsure about taking appt. Appt time was filled while on the phone. Advised that I will hold this message for work in appt and follow up tomorrow.

## 2022-11-26 NOTE — Telephone Encounter (Signed)
LMTCB

## 2022-11-28 ENCOUNTER — Encounter: Payer: Self-pay | Admitting: Internal Medicine

## 2022-11-28 NOTE — Telephone Encounter (Signed)
Called and offered patient appt for 4/25 at 1:00. Patient was still sleeping. Spouse said that he will have her call or my chart to let me know if this appt works for them. Holding appt time for patient.

## 2022-11-29 ENCOUNTER — Telehealth: Payer: Self-pay | Admitting: Internal Medicine

## 2022-11-29 ENCOUNTER — Ambulatory Visit: Payer: BC Managed Care – PPO | Admitting: Internal Medicine

## 2022-11-29 VITALS — BP 132/76 | HR 92 | Resp 16 | Ht 68.0 in | Wt 297.0 lb

## 2022-11-29 DIAGNOSIS — I10 Essential (primary) hypertension: Secondary | ICD-10-CM

## 2022-11-29 DIAGNOSIS — E7800 Pure hypercholesterolemia, unspecified: Secondary | ICD-10-CM | POA: Diagnosis not present

## 2022-11-29 DIAGNOSIS — D75839 Thrombocytosis, unspecified: Secondary | ICD-10-CM

## 2022-11-29 DIAGNOSIS — E1165 Type 2 diabetes mellitus with hyperglycemia: Secondary | ICD-10-CM | POA: Diagnosis not present

## 2022-11-29 DIAGNOSIS — D509 Iron deficiency anemia, unspecified: Secondary | ICD-10-CM | POA: Diagnosis not present

## 2022-11-29 DIAGNOSIS — K219 Gastro-esophageal reflux disease without esophagitis: Secondary | ICD-10-CM

## 2022-11-29 DIAGNOSIS — N2889 Other specified disorders of kidney and ureter: Secondary | ICD-10-CM

## 2022-11-29 DIAGNOSIS — F439 Reaction to severe stress, unspecified: Secondary | ICD-10-CM

## 2022-11-29 DIAGNOSIS — C649 Malignant neoplasm of unspecified kidney, except renal pelvis: Secondary | ICD-10-CM

## 2022-11-29 MED ORDER — AMLODIPINE BESYLATE 10 MG PO TABS: ORAL_TABLET | ORAL | 2 refills | Status: DC

## 2022-11-29 MED ORDER — PRAVASTATIN SODIUM 40 MG PO TABS: 40.0000 mg | ORAL_TABLET | Freq: Every day | ORAL | 1 refills | Status: DC

## 2022-11-29 MED ORDER — LOSARTAN POTASSIUM 100 MG PO TABS: 100.0000 mg | ORAL_TABLET | Freq: Every day | ORAL | 2 refills | Status: DC

## 2022-11-29 MED ORDER — METFORMIN HCL 500 MG PO TABS: 1000.0000 mg | ORAL_TABLET | Freq: Two times a day (BID) | ORAL | 1 refills | Status: DC

## 2022-11-29 MED ORDER — INTEGRA 62.5-62.5-40-3 MG PO CAPS: ORAL_CAPSULE | ORAL | 1 refills | Status: DC

## 2022-11-29 MED ORDER — METOPROLOL SUCCINATE ER 100 MG PO TB24: 50.0000 mg | ORAL_TABLET | Freq: Two times a day (BID) | ORAL | 2 refills | Status: DC

## 2022-11-29 NOTE — Telephone Encounter (Signed)
Patient has a lab appointment 12/03/2022, there are no orders in. 

## 2022-11-29 NOTE — Telephone Encounter (Signed)
She is getting labs today - instead of 12/03/22 labs

## 2022-11-29 NOTE — Telephone Encounter (Signed)
Labs ordered during visit.

## 2022-11-29 NOTE — Patient Instructions (Signed)
Benefiber - daily 

## 2022-11-29 NOTE — Progress Notes (Signed)
Subjective:    Patient ID: Lindsey Strickland, female    DOB: [birth year], 47 y.o.   MRN: [id number]  Patient here for  Chief Complaint  Patient presents with   Medical Management of Chronic Issues    HPI Here for follow up appt - regarding diabetes, hypertension and hypercholesterolemia.  S/p partial right nephrectomy - for RCC. She is accompanied by her husband.  History obtained from both of them.  She had called in concerned initially about bruising around her rectum.  This has resolved.  Previously had increased bruising from the surgery - improved significantly.  She does report increased fatigue.  Fatigue is getting some better.  Some occasional nausea.  No vomiting.  Eating.  No chest pain or sob reported.  Discussed benefiber to keep bowels moving.  Has noticed urine is cloudy, but has increased water intake.  Just saw urology and discussed cloudy urine.  No fever.  No dysuria  elected to monitor.  Not checking sugars.  Once above issues sorted through and improved, plans to get more serious about sugars, diet and exercise.     Past Medical History:  Diagnosis Date   Atypical endometrial hyperplasia    Diabetes mellitus (HCC)    Diarrhea    Environmental allergies    Generalized anxiety disorder    GERD (gastroesophageal reflux disease)    Hypercholesterolemia    Hypertension    Insomnia    Major depressive disorder    Past Surgical History:  Procedure Laterality Date   COLONOSCOPY     COLONOSCOPY WITH PROPOFOL N/A 05/28/2018   Procedure: COLONOSCOPY WITH PROPOFOL;  Surgeon: Scot Jun, MD;  Location: [facility] ENDOSCOPY;  Service: Endoscopy;  Laterality: N/A;   ESOPHAGOGASTRODUODENOSCOPY (EGD) WITH PROPOFOL N/A 05/28/2018   Procedure: ESOPHAGOGASTRODUODENOSCOPY (EGD) WITH PROPOFOL;  Surgeon: Scot Jun, MD;  Location: [facility] ENDOSCOPY;  Service: Endoscopy;  Laterality: N/A;   HYSTEROSCOPY WITH D & C     and polyp removal   Family History  Problem Relation Age of  Onset   Hypertension Mother    Diabetes Mother    Diverticulosis Mother    Depression Mother    Hypercholesterolemia Father    Hypertension Father    Cancer Father    Heart disease Maternal Grandfather    Diverticulosis Paternal Grandmother    Hyperlipidemia Paternal Grandmother    Heart failure Paternal Grandmother    Cancer Maternal Grandmother    Alcoholism Paternal Uncle    Social History   Socioeconomic History   Marital status: Single    Spouse name: Not on file   Number of children: 2   Years of education: Not on file   Highest education level: High school graduate  Occupational History    Employer: [employer]  Tobacco Use   Smoking status: Former    Packs/day: 1    Types: Cigarettes    Quit date: 08/06/2000    Years since quitting: 22.3   Smokeless tobacco: Never  Vaping Use   Vaping Use: Never used  Substance and Sexual Activity   Alcohol use: Yes    Alcohol/week: 0.0 standard drinks of alcohol    Comment: rare   Drug use: No   Sexual activity: Yes    Birth control/protection: I.U.D.  Other Topics Concern   Not on file  Social History Narrative   Not on file   Social Determinants of Health   Financial Resource Strain: Patient Declined (11/29/2022)   Overall Financial Resource Strain (CARDIA)  Difficulty of Paying Living Expenses: Patient declined  Food Insecurity: Patient Declined (11/29/2022)   Hunger Vital Sign    Worried About Running Out of Food in the Last Year: Patient declined    Ran Out of Food in the Last Year: Patient declined  Transportation Needs: Patient Declined (11/29/2022)   PRAPARE - Administrator, Civil Service (Medical): Patient declined    Lack of Transportation (Non-Medical): Patient declined  Physical Activity: Unknown (11/29/2022)   Exercise Vital Sign    Days of Exercise per Week: Patient declined    Minutes of Exercise per Session: Not on file  Stress: Stress Concern Present (11/29/2022)   [employer]  of Occupational Health - Occupational Stress Questionnaire    Feeling of Stress : Rather much  Social Connections: Unknown (11/29/2022)   Social Connection and Isolation Panel [NHANES]    Frequency of Communication with Friends and Family: Patient declined    Frequency of Social Gatherings with Friends and Family: Patient declined    Attends Religious Services: Patient declined    Database administrator or Organizations: Patient declined    Attends Banker Meetings: Not on file    Marital Status: Patient declined     Review of Systems  Constitutional:  Positive for fatigue. Negative for fever.  HENT:  Negative for congestion and sinus pressure.   Respiratory:  Negative for cough, chest tightness and shortness of breath.   Cardiovascular:  Negative for chest pain and palpitations.  Gastrointestinal:  Positive for nausea. Negative for vomiting.       Abdominal soreness from surgery - improved.   Genitourinary:  Negative for difficulty urinating and dysuria.  Musculoskeletal:  Negative for joint swelling and myalgias.  Skin:  Negative for color change and rash.  Neurological:  Negative for dizziness and headaches.  Psychiatric/Behavioral:  Negative for agitation and dysphoric mood.        Objective:     BP 132/76   Pulse 92   Resp 16   Ht 5\' 8"  (1.727 m)   Wt 297 lb (134.7 kg)   SpO2 99%   BMI 45.16 kg/m  Wt Readings from Last 3 Encounters:  11/29/22 297 lb (134.7 kg)  09/06/22 (!) 302 lb (137 kg)  07/19/22 (!) 302 lb (137 kg)    Physical Exam Vitals reviewed.  Constitutional:      General: She is not in acute distress.    Appearance: Normal appearance.  HENT:     Head: Normocephalic and atraumatic.     Right Ear: External ear normal.     Left Ear: External ear normal.  Eyes:     General: No scleral icterus.       Right eye: No discharge.        Left eye: No discharge.     Conjunctiva/sclera: Conjunctivae normal.  Neck:     Thyroid: No thyromegaly.   Cardiovascular:     Rate and Rhythm: Normal rate and regular rhythm.  Pulmonary:     Effort: No respiratory distress.     Breath sounds: Normal breath sounds. No wheezing.  Abdominal:     General: Bowel sounds are normal.     Palpations: Abdomen is soft.     Tenderness: There is no abdominal tenderness.  Musculoskeletal:        General: No swelling or tenderness.     Cervical back: Neck supple. No tenderness.  Lymphadenopathy:     Cervical: No cervical adenopathy.  Skin:  Findings: No erythema or rash.     Comments: Incision sites - healing well.    Neurological:     Mental Status: She is alert.  Psychiatric:        Mood and Affect: Mood normal.        Behavior: Behavior normal.      Outpatient Encounter Medications as of 11/29/2022  Medication Sig   albuterol (VENTOLIN HFA) 108 (90 Base) MCG/ACT inhaler Inhale 2 puffs into the lungs every 6 (six) hours as needed for wheezing. Patient request ProAir brand only.   amLODipine (NORVASC) 10 MG tablet Take 1/2 (one-half) tablet by mouth twice daily   Cholecalciferol 10 MCG (400 UNIT) CHEW Chew by mouth.   famotidine (PEPCID) 20 MG tablet TAKE 2 TABLETS (40 MG) BY MOUTH NIGHTLY   Fe Fum-FePoly-Vit C-Vit B3 (INTEGRA) 62.5-62.5-40-3 MG CAPS Take 1 capsule by mouth once daily   fluticasone (FLONASE) 50 MCG/ACT nasal spray Use 1 spray(s) in each nostril once daily   glucose blood test strip Use as directed to check sugars BID. Dx E11.9   Lancets MISC by Does not apply route. Use 1 lancets three times a day   levonorgestrel (MIRENA) 20 MCG/DAY IUD by Intrauterine route.   loratadine (CLARITIN) 10 MG tablet Take 10 mg by mouth daily.   losartan (COZAAR) 100 MG tablet Take 1 tablet (100 mg total) by mouth daily.   metFORMIN (GLUCOPHAGE) 500 MG tablet Take 2 tablets (1,000 mg total) by mouth 2 (two) times daily.   metoprolol succinate (TOPROL-XL) 100 MG 24 hr tablet Take 0.5 tablets (50 mg total) by mouth 2 (two) times daily. Take with  or immediately following a meal.   pravastatin (PRAVACHOL) 40 MG tablet Take 1 tablet (40 mg total) by mouth daily.   [DISCONTINUED] amLODipine (NORVASC) 10 MG tablet Take 1/2 (one-half) tablet by mouth twice daily   [DISCONTINUED] Fe Fum-FePoly-Vit C-Vit B3 (INTEGRA) 62.5-62.5-40-3 MG CAPS Take 1 capsule by mouth once daily   [DISCONTINUED] losartan (COZAAR) 100 MG tablet Take 1 tablet (100 mg total) by mouth daily.   [DISCONTINUED] metFORMIN (GLUCOPHAGE) 500 MG tablet Take 2 tablets by mouth twice daily   [DISCONTINUED] metoprolol succinate (TOPROL-XL) 100 MG 24 hr tablet Take 0.5 tablets (50 mg total) by mouth 2 (two) times daily. Take with or immediately following a meal.   [DISCONTINUED] oseltamivir (TAMIFLU) 75 MG capsule Take one tablet q day for 10 days.  If symptoms, increase to bid and complete 5 day course.   [DISCONTINUED] pravastatin (PRAVACHOL) 40 MG tablet Take 1 tablet (40 mg total) by mouth daily.   No facility-administered encounter medications on file as of 11/29/2022.     Lab Results  Component Value Date   WBC 11.0 (H) 11/29/2022   HGB 10.8 (L) 11/29/2022   HCT 34.3 (L) 11/29/2022   PLT 509.0 (H) 11/29/2022   GLUCOSE 110 (H) 11/29/2022   CHOL 190 07/24/2022   TRIG 194.0 (H) 07/24/2022   HDL 50.20 07/24/2022   LDLDIRECT 132.0 11/02/2020   LDLCALC 101 (H) 07/24/2022   ALT 14 11/29/2022   AST 12 11/29/2022   NA 138 11/29/2022   K 4.8 11/29/2022   CL 101 11/29/2022   CREATININE 0.73 11/29/2022   BUN 17 11/29/2022   CO2 24 11/29/2022   TSH 5.06 07/24/2022   INR 0.93 05/05/2018   HGBA1C 7.3 (H) 11/29/2022   MICROALBUR 13.4 (H) 07/24/2022    No results found.     Assessment & Plan:  Type 2  diabetes mellitus with hyperglycemia, without long-term current use of insulin (HCC) Assessment & Plan: Last A1c 7.7.  Have discussed diet and exercise.  She plans to get more serious about diet and exercise once recovered from surgery.  Discussed importance of getting  sugars under control.  Have discussed medications.  Has desired not to add medications.  Follow met b and A1c.  Recheck today.     Orders: -     Hemoglobin A1c  Hypercholesterolemia Assessment & Plan: Continue pravastatin.  Diet and exercise.  Check lipid panel and liver function tests. Had previously discussed changing to crestor.  Had wanted to hold on change.  Follow.   Orders: -     Hepatic function panel  Primary hypertension Assessment & Plan: Continue losartan, metoprolol and amlodipine.  Follow pressures.  Follow metabolic panel.    Orders: -     Basic metabolic panel -     CBC with Differential/Platelet  Iron deficiency anemia, unspecified iron deficiency anemia type Assessment & Plan: Has seen hematology previously.  Hgb low during recently hospitalization. Recheck cbc and ferritin. Discussed if hgb remains low despite oral iron, may need referral back for iron infusion.   Orders: -     IBC + Ferritin  Gastroesophageal reflux disease without esophagitis Assessment & Plan: On prilosec.  Continue.     Renal mass Assessment & Plan: S/p laparoscopic partial nephrectomy 10/2022 (Dr Celso Sickle).  Doing well s/p surgery.  Continues f/u with urology.     Severe obesity (BMI >= 40) (HCC) Assessment & Plan: Discussed diet and exercise.  Follow.    Stress Assessment & Plan: Increased stress.  Seeing her therapist.     Thrombocytosis [facility]) Assessment & Plan: Has been evaluated by hematology.  Felt to be reactive.  Follow cbc. Recheck today.    Renal cell carcinoma, unspecified laterality [facility]) Assessment & Plan: S/p recent partial nephrectomy as outlined.  Continues f/u with urology.  Planning f/u CT.    Other orders -     amLODIPine Besylate; Take 1/2 (one-half) tablet by mouth twice daily  Dispense: 90 tablet; Refill: 2 -     Integra; Take 1 capsule by mouth once daily  Dispense: 90 capsule; Refill: 1 -     Losartan Potassium; Take 1 tablet (100 mg total) by  mouth daily.  Dispense: 90 tablet; Refill: 2 -     metFORMIN HCl; Take 2 tablets (1,000 mg total) by mouth 2 (two) times daily.  Dispense: 360 tablet; Refill: 1 -     Metoprolol Succinate ER; Take 0.5 tablets (50 mg total) by mouth 2 (two) times daily. Take with or immediately following a meal.  Dispense: 90 tablet; Refill: 2 -     Pravastatin Sodium; Take 1 tablet (40 mg total) by mouth daily.  Dispense: 90 tablet; Refill: 1     Dale [place], MD

## 2022-11-30 LAB — HEPATIC FUNCTION PANEL
ALT: 14 U/L (ref 0–35)
AST: 12 U/L (ref 0–37)
Albumin: 4.3 g/dL (ref 3.5–5.2)
Alkaline Phosphatase: 69 U/L (ref 39–117)
Bilirubin, Direct: 0.1 mg/dL (ref 0.0–0.3)
Total Bilirubin: 0.3 mg/dL (ref 0.2–1.2)
Total Protein: 7.2 g/dL (ref 6.0–8.3)

## 2022-11-30 LAB — CBC WITH DIFFERENTIAL/PLATELET
Basophils Absolute: 0.1 10*3/uL (ref 0.0–0.1)
Basophils Relative: 1.3 % (ref 0.0–3.0)
Eosinophils Absolute: 0.2 10*3/uL (ref 0.0–0.7)
Eosinophils Relative: 1.7 % (ref 0.0–5.0)
HCT: 34.3 % — ABNORMAL LOW (ref 36.0–46.0)
Hemoglobin: 10.8 g/dL — ABNORMAL LOW (ref 12.0–15.0)
Lymphocytes Relative: 20.9 % (ref 12.0–46.0)
Lymphs Abs: 2.3 10*3/uL (ref 0.7–4.0)
MCHC: 31.4 g/dL (ref 30.0–36.0)
MCV: 80.6 fl (ref 78.0–100.0)
Monocytes Absolute: 0.7 10*3/uL (ref 0.1–1.0)
Monocytes Relative: 6.1 % (ref 3.0–12.0)
Neutro Abs: 7.7 10*3/uL (ref 1.4–7.7)
Neutrophils Relative %: 70 % (ref 43.0–77.0)
Platelets: 509 10*3/uL — ABNORMAL HIGH (ref 150.0–400.0)
RBC: 4.26 Mil/uL (ref 3.87–5.11)
RDW: 16.2 % — ABNORMAL HIGH (ref 11.5–15.5)
WBC: 11 10*3/uL — ABNORMAL HIGH (ref 4.0–10.5)

## 2022-11-30 LAB — IBC + FERRITIN
Ferritin: 8.5 ng/mL — ABNORMAL LOW (ref 10.0–291.0)
Iron: 38 ug/dL — ABNORMAL LOW (ref 42–145)
Saturation Ratios: 8.4 % — ABNORMAL LOW (ref 20.0–50.0)
TIBC: 450.8 ug/dL — ABNORMAL HIGH (ref 250.0–450.0)
Transferrin: 322 mg/dL (ref 212.0–360.0)

## 2022-11-30 LAB — BASIC METABOLIC PANEL
BUN: 17 mg/dL (ref 6–23)
CO2: 24 mEq/L (ref 19–32)
Calcium: 9.7 mg/dL (ref 8.4–10.5)
Chloride: 101 mEq/L (ref 96–112)
Creatinine, Ser: 0.73 mg/dL (ref 0.40–1.20)
GFR: 100.08 mL/min (ref >60.00)
Glucose, Bld: 110 mg/dL — ABNORMAL HIGH (ref 70–99)
Potassium: 4.8 mEq/L (ref 3.5–5.1)
Sodium: 138 mEq/L (ref 135–145)

## 2022-11-30 LAB — HEMOGLOBIN A1C: Hgb A1c MFr Bld: 7.3 % — ABNORMAL HIGH (ref 4.6–6.5)

## 2022-12-02 ENCOUNTER — Encounter: Payer: Self-pay | Admitting: Internal Medicine

## 2022-12-02 DIAGNOSIS — C649 Malignant neoplasm of unspecified kidney, except renal pelvis: Secondary | ICD-10-CM | POA: Insufficient documentation

## 2022-12-02 NOTE — Assessment & Plan Note (Signed)
Discussed diet and exercise.  Follow.  

## 2022-12-02 NOTE — Assessment & Plan Note (Signed)
Has seen hematology previously.  Hgb low during recently hospitalization. Recheck cbc and ferritin. Discussed if hgb remains low despite oral iron, may need referral back for iron infusion.

## 2022-12-02 NOTE — Assessment & Plan Note (Signed)
Low carb diet and exercise.  On metformin.  Have discussed other treatment options as outlined.  Has declined to start.  Follow met b and a1c.  

## 2022-12-02 NOTE — Assessment & Plan Note (Signed)
S/p laparoscopic partial nephrectomy 10/2022 (Dr Celso Sickle).  Doing well s/p surgery.  Continues f/u with urology.

## 2022-12-02 NOTE — Assessment & Plan Note (Signed)
S/p recent partial nephrectomy as outlined.  Continues f/u with urology.  Planning f/u CT.

## 2022-12-02 NOTE — Assessment & Plan Note (Signed)
Continue pravastatin.  Diet and exercise.  Check lipid panel and liver function tests. Had previously discussed changing to crestor.  Had wanted to hold on change.  Follow.  

## 2022-12-02 NOTE — Assessment & Plan Note (Addendum)
Last A1c 7.7.  Have discussed diet and exercise.  She plans to get more serious about diet and exercise once recovered from surgery.  Discussed importance of getting sugars under control.  Have discussed medications.  Has desired not to add medications.  Follow met b and A1c.  Recheck today.

## 2022-12-02 NOTE — Assessment & Plan Note (Signed)
Continue losartan, metoprolol and amlodipine.  Follow pressures.  Follow metabolic panel.  

## 2022-12-02 NOTE — Assessment & Plan Note (Signed)
On prilosec.  Continue.   

## 2022-12-02 NOTE — Assessment & Plan Note (Signed)
Increased stress.  Seeing her therapist.

## 2022-12-02 NOTE — Assessment & Plan Note (Signed)
Has been evaluated by hematology.  Felt to be reactive.  Follow cbc. Recheck today.

## 2022-12-03 ENCOUNTER — Encounter: Payer: Self-pay | Admitting: Internal Medicine

## 2022-12-03 ENCOUNTER — Telehealth: Payer: Self-pay | Admitting: *Deleted

## 2022-12-03 ENCOUNTER — Other Ambulatory Visit: Payer: BC Managed Care – PPO

## 2022-12-03 DIAGNOSIS — D509 Iron deficiency anemia, unspecified: Secondary | ICD-10-CM

## 2022-12-03 DIAGNOSIS — E559 Vitamin D deficiency, unspecified: Secondary | ICD-10-CM

## 2022-12-03 NOTE — Telephone Encounter (Signed)
Noted  

## 2022-12-03 NOTE — Telephone Encounter (Signed)
Left voicemail to return call. 

## 2022-12-03 NOTE — Telephone Encounter (Signed)
-----   Message from Dale Frankston, MD sent at 12/01/2022 11:57 AM EDT ----- Notify - overall sugar control has improved.  A1c 7.3.  hgb is stable.  Still decreased, but stable.  Platelet count increased.  Had discussed with her regarding elevation can be from inflammation and iron deficiency.  Confirm taking iron. If taking, then I would like to refer her to hematology for evaluation of iron deficiency and question of need for iron infusions. Kidney function tests and liver function tests are wnl. Will need f/u regarding cbc and.  F/u with be determined pending if she is agreeable to hematology referral.  Just let me know.

## 2022-12-03 NOTE — Telephone Encounter (Signed)
Since she is declining hematology referral, she needs to continue iron supplements.  I would like to recheck cbc and ferritin in 4 weeks to confirm improving.  If not, will need to refer to hematology then - for evaluation - iron infusions.

## 2022-12-03 NOTE — Telephone Encounter (Signed)
Pt called back and I read the message to her and she will message the provider through mychart with the answer

## 2022-12-04 NOTE — Telephone Encounter (Signed)
Ordered added for vitamin D to be drawn with cbc and ferritin.

## 2022-12-04 NOTE — Addendum Note (Signed)
Addended by: Charm Barges on: 12/04/2022 05:20 PM   Modules accepted: Orders

## 2022-12-07 ENCOUNTER — Ambulatory Visit: Payer: BC Managed Care – PPO | Admitting: Internal Medicine

## 2022-12-24 ENCOUNTER — Inpatient Hospital Stay: Admission: RE | Admit: 2022-12-24 | Payer: BC Managed Care – PPO | Source: Ambulatory Visit

## 2022-12-28 ENCOUNTER — Other Ambulatory Visit: Payer: BC Managed Care – PPO

## 2023-01-03 ENCOUNTER — Other Ambulatory Visit: Payer: BC Managed Care – PPO

## 2023-01-14 ENCOUNTER — Other Ambulatory Visit (INDEPENDENT_AMBULATORY_CARE_PROVIDER_SITE_OTHER): Payer: BC Managed Care – PPO

## 2023-01-14 DIAGNOSIS — E559 Vitamin D deficiency, unspecified: Secondary | ICD-10-CM | POA: Diagnosis not present

## 2023-01-14 DIAGNOSIS — D509 Iron deficiency anemia, unspecified: Secondary | ICD-10-CM

## 2023-01-14 LAB — VITAMIN D 25 HYDROXY (VIT D DEFICIENCY, FRACTURES): VITD: 34.03 ng/mL (ref 30.00–100.00)

## 2023-01-14 LAB — FERRITIN: Ferritin: 8.5 ng/mL — ABNORMAL LOW (ref 10.0–291.0)

## 2023-01-15 LAB — CBC WITH DIFFERENTIAL/PLATELET
Basophils Absolute: 0.1 10*3/uL (ref 0.0–0.1)
Basophils Relative: 0.9 % (ref 0.0–3.0)
Eosinophils Absolute: 0.2 10*3/uL (ref 0.0–0.7)
Eosinophils Relative: 1.8 % (ref 0.0–5.0)
HCT: 34.4 % — ABNORMAL LOW (ref 36.0–46.0)
Hemoglobin: 10.7 g/dL — ABNORMAL LOW (ref 12.0–15.0)
Lymphocytes Relative: 21.7 % (ref 12.0–46.0)
Lymphs Abs: 2.4 10*3/uL (ref 0.7–4.0)
MCHC: 31.1 g/dL (ref 30.0–36.0)
MCV: 80.8 fl (ref 78.0–100.0)
Monocytes Absolute: 0.6 10*3/uL (ref 0.1–1.0)
Monocytes Relative: 5.5 % (ref 3.0–12.0)
Neutro Abs: 7.7 10*3/uL (ref 1.4–7.7)
Neutrophils Relative %: 70.1 % (ref 43.0–77.0)
Platelets: 479 10*3/uL — ABNORMAL HIGH (ref 150.0–400.0)
RBC: 4.26 Mil/uL (ref 3.87–5.11)
RDW: 16.4 % — ABNORMAL HIGH (ref 11.5–15.5)
WBC: 11 10*3/uL — ABNORMAL HIGH (ref 4.0–10.5)

## 2023-01-17 ENCOUNTER — Other Ambulatory Visit: Payer: Self-pay | Admitting: Internal Medicine

## 2023-01-17 DIAGNOSIS — D649 Anemia, unspecified: Secondary | ICD-10-CM

## 2023-01-17 NOTE — Progress Notes (Signed)
Order placed for hematology referral.  

## 2023-01-21 ENCOUNTER — Inpatient Hospital Stay: Payer: BC Managed Care – PPO | Attending: Internal Medicine | Admitting: Internal Medicine

## 2023-01-21 ENCOUNTER — Inpatient Hospital Stay: Payer: BC Managed Care – PPO

## 2023-01-21 ENCOUNTER — Encounter: Payer: Self-pay | Admitting: Internal Medicine

## 2023-01-21 VITALS — Ht 68.0 in

## 2023-01-21 DIAGNOSIS — E559 Vitamin D deficiency, unspecified: Secondary | ICD-10-CM | POA: Insufficient documentation

## 2023-01-21 DIAGNOSIS — D509 Iron deficiency anemia, unspecified: Secondary | ICD-10-CM | POA: Diagnosis present

## 2023-01-21 DIAGNOSIS — D649 Anemia, unspecified: Secondary | ICD-10-CM

## 2023-01-21 DIAGNOSIS — Z85528 Personal history of other malignant neoplasm of kidney: Secondary | ICD-10-CM | POA: Insufficient documentation

## 2023-01-21 DIAGNOSIS — N920 Excessive and frequent menstruation with regular cycle: Secondary | ICD-10-CM | POA: Insufficient documentation

## 2023-01-21 LAB — PREGNANCY, URINE: Preg Test, Ur: NEGATIVE

## 2023-01-21 NOTE — Progress Notes (Signed)
[facility] CONSULT NOTE  Patient Care Team: Dale Waseca, MD as PCP - General (Internal Medicine)  CHIEF COMPLAINTS/PURPOSE OF CONSULTATION: ANEMIA   HEMATOLOGY HISTORY  # ANEMIA[Hb; MCV-platelets- WBC; Iron sat; ferritin;  GFR- CT/US- ;  EGD/colonoscopy-   Latest Reference Range & Units 11/29/22 13:57  Iron 42 - 145 ug/dL 38 (L)  TIBC 161.0 - 960.4 mcg/dL 540.9 (H)  Saturation Ratios 20.0 - 50.0 % 8.4 (L)  Ferritin 10.0 - 291.0 ng/mL 8.5 (L)  Transferrin 212.0 - 360.0 mg/dL 811.9  (L): Data is abnormally low (H): Data is abnormally high  HISTORY OF PRESENTING ILLNESS: Patient ambulating-independently.  Alone/Accompanied by family.    Lindsey Strickland 47 y.o.  female pleasant patient was been referred to [place] for further evaluation of anemia.  Patient had partial nephrectomy in March-there is on incidental CT finding.  Hemoglobin never really improved since that surgery according to patient. Has chronic hemorrhoids. Has dyspnea, fatigue. Pt has a hx of anemia for a while. Saw Dr Merlene Pulling in the past. Has heavy menses    Blood in stools: colo 2029- internal bleed [[facility]] very rare.  Blood in urine: none  Difficulty swallowing: Change of bowel movement/constipation: none  Prior blood transfusion:none  Prior history of blood loss: none  Liver disease:none  Alcohol: none  Bariatric surgery:none   Vaginal bleeding: heavy Prior evaluation with hematology: yes Prior bone marrow biopsy: none  Oral iron: on PO iron  Prior IV iron infusions:none    Review of Systems  Constitutional:  Positive for malaise/fatigue. Negative for chills, diaphoresis, fever and weight loss.  HENT:  Negative for nosebleeds and sore throat.   Eyes:  Negative for double vision.  Respiratory:  Positive for shortness of breath. Negative for cough, hemoptysis, sputum production and wheezing.   Cardiovascular:  Negative for chest pain, palpitations, orthopnea and leg swelling.   Gastrointestinal:  Negative for abdominal pain, blood in stool, constipation, diarrhea, heartburn, melena, nausea and vomiting.  Genitourinary:  Negative for dysuria, frequency and urgency.  Musculoskeletal:  Negative for back pain and joint pain.  Skin: Negative.  Negative for itching and rash.  Neurological:  Positive for dizziness. Negative for tingling, focal weakness, weakness and headaches.  Endo/Heme/Allergies:  Does not bruise/bleed easily.  Psychiatric/Behavioral:  Negative for depression. The patient is not nervous/anxious and does not have insomnia.      MEDICAL HISTORY:  Past Medical History:  Diagnosis Date   Atypical endometrial hyperplasia    Diabetes mellitus (HCC)    Diarrhea    Environmental allergies    Generalized anxiety disorder    GERD (gastroesophageal reflux disease)    Hypercholesterolemia    Hypertension    Insomnia    Major depressive disorder     SURGICAL HISTORY: Past Surgical History:  Procedure Laterality Date   COLONOSCOPY     COLONOSCOPY WITH PROPOFOL N/A 05/28/2018   Procedure: COLONOSCOPY WITH PROPOFOL;  Surgeon: Scot Jun, MD;  Location: [facility] ENDOSCOPY;  Service: Endoscopy;  Laterality: N/A;   ESOPHAGOGASTRODUODENOSCOPY (EGD) WITH PROPOFOL N/A 05/28/2018   Procedure: ESOPHAGOGASTRODUODENOSCOPY (EGD) WITH PROPOFOL;  Surgeon: Scot Jun, MD;  Location: [facility] ENDOSCOPY;  Service: Endoscopy;  Laterality: N/A;   HYSTEROSCOPY WITH D & C     and polyp removal    SOCIAL HISTORY: Social History   Socioeconomic History   Marital status: Single    Spouse name: Not on file   Number of children: 2   Years of education: Not on file  Highest education level: High school graduate  Occupational History    Employer: [employer]  Tobacco Use   Smoking status: Former    Packs/day: 1    Types: Cigarettes    Quit date: 08/06/2000    Years since quitting: 22.4   Smokeless tobacco: Never  Vaping Use   Vaping Use: Never used   Substance and Sexual Activity   Alcohol use: Yes    Alcohol/week: 0.0 standard drinks of alcohol    Comment: rare   Drug use: No   Sexual activity: Yes    Birth control/protection: I.U.D.  Other Topics Concern   Not on file  Social History Narrative   Not on file   Social Determinants of Health   Financial Resource Strain: Patient Declined (11/29/2022)   Overall Financial Resource Strain (CARDIA)    Difficulty of Paying Living Expenses: Patient declined  Food Insecurity: No Food Insecurity (01/21/2023)   Hunger Vital Sign    Worried About Running Out of Food in the Last Year: Never true    Ran Out of Food in the Last Year: Never true  Transportation Needs: No Transportation Needs (01/21/2023)   PRAPARE - Administrator, Civil Service (Medical): No    Lack of Transportation (Non-Medical): No  Physical Activity: Unknown (11/29/2022)   Exercise Vital Sign    Days of Exercise per Week: Patient declined    Minutes of Exercise per Session: Not on file  Stress: Stress Concern Present (11/29/2022)   [employer] of Occupational Health - Occupational Stress Questionnaire    Feeling of Stress : Rather much  Social Connections: Unknown (11/29/2022)   Social Connection and Isolation Panel [NHANES]    Frequency of Communication with Friends and Family: Patient declined    Frequency of Social Gatherings with Friends and Family: Patient declined    Attends Religious Services: Patient declined    Database administrator or Organizations: Patient declined    Attends Banker Meetings: Not on file    Marital Status: Patient declined  Intimate Partner Violence: Not At Risk (01/21/2023)   Humiliation, Afraid, Rape, and Kick questionnaire    Fear of Current or Ex-Partner: No    Emotionally Abused: No    Physically Abused: No    Sexually Abused: No    FAMILY HISTORY: Family History  Problem Relation Age of Onset   Hypertension Mother    Diabetes Mother     Diverticulosis Mother    Depression Mother    Hypercholesterolemia Father    Hypertension Father    Cancer Father    Heart disease Maternal Grandfather    Diverticulosis Paternal Grandmother    Hyperlipidemia Paternal Grandmother    Heart failure Paternal Grandmother    Cancer Maternal Grandmother    Alcoholism Paternal Uncle     ALLERGIES:  is allergic to tetracycline, augmentin [amoxicillin-pot clavulanate], hctz [hydrochlorothiazide], lisinopril, other, prednisone, and ciprofloxacin.  MEDICATIONS:  Current Outpatient Medications  Medication Sig Dispense Refill   albuterol (VENTOLIN HFA) 108 (90 Base) MCG/ACT inhaler Inhale 2 puffs into the lungs every 6 (six) hours as needed for wheezing. Patient request ProAir brand only. 18 g 2   amLODipine (NORVASC) 10 MG tablet Take 1/2 (one-half) tablet by mouth twice daily 90 tablet 2   Cholecalciferol (VITAMIN D3) 50 MCG (2000 UT) capsule Take 2,000 Units by mouth daily.     famotidine (PEPCID) 20 MG tablet TAKE 2 TABLETS (40 MG) BY MOUTH NIGHTLY     Fe Fum-FePoly-Vit  C-Vit B3 (INTEGRA) 62.5-62.5-40-3 MG CAPS Take 1 capsule by mouth once daily 90 capsule 1   fluticasone (FLONASE) 50 MCG/ACT nasal spray Use 1 spray(s) in each nostril once daily 16 g 0   glucose blood test strip Use as directed to check sugars BID. Dx E11.9 100 each 12   Lancets MISC by Does not apply route. Use 1 lancets three times a day     levonorgestrel (MIRENA) 20 MCG/DAY IUD by Intrauterine route.     loratadine (CLARITIN) 10 MG tablet Take 10 mg by mouth daily.     losartan (COZAAR) 100 MG tablet Take 1 tablet (100 mg total) by mouth daily. 90 tablet 2   metFORMIN (GLUCOPHAGE) 500 MG tablet Take 2 tablets (1,000 mg total) by mouth 2 (two) times daily. 360 tablet 1   metoprolol succinate (TOPROL-XL) 100 MG 24 hr tablet Take 0.5 tablets (50 mg total) by mouth 2 (two) times daily. Take with or immediately following a meal. 90 tablet 2   pravastatin (PRAVACHOL) 40 MG tablet  Take 1 tablet (40 mg total) by mouth daily. 90 tablet 1   No current facility-administered medications for this visit.     PHYSICAL EXAMINATION:   There were no vitals filed for this visit. There were no vitals filed for this visit.  Physical Exam Vitals and nursing note reviewed.  HENT:     Head: Normocephalic and atraumatic.     Mouth/Throat:     Pharynx: Oropharynx is clear.  Eyes:     Extraocular Movements: Extraocular movements intact.     Pupils: Pupils are equal, round, and reactive to light.  Cardiovascular:     Rate and Rhythm: Normal rate and regular rhythm.  Pulmonary:     Comments: Decreased breath sounds bilaterally.  Abdominal:     Palpations: Abdomen is soft.  Musculoskeletal:        General: Normal range of motion.     Cervical back: Normal range of motion.  Skin:    General: Skin is warm.  Neurological:     General: No focal deficit present.     Mental Status: She is alert and oriented to person, place, and time.  Psychiatric:        Behavior: Behavior normal.        Judgment: Judgment normal.      LABORATORY DATA:  I have reviewed the data as listed Lab Results  Component Value Date   WBC 11.0 (H) 01/14/2023   HGB 10.7 (L) 01/14/2023   HCT 34.4 (L) 01/14/2023   MCV 80.8 01/14/2023   PLT 479.0 (H) 01/14/2023   Recent Labs    07/24/22 0817 11/29/22 1357  NA 136 138  K 4.2 4.8  CL 100 101  CO2 26 24  GLUCOSE 167* 110*  BUN 13 17  CREATININE 0.49 0.73  CALCIUM 9.2 9.7  PROT 6.9 7.2  ALBUMIN 4.2 4.3  AST 17 12  ALT 22 14  ALKPHOS 61 69  BILITOT 0.3 0.3  BILIDIR 0.0 0.1     No results found.  ASSESSMENT & PLAN:   Symptomatic anemia # Anemia- Hb- 10.5; Ferritin- 8.5 [PCP- APRIL 2024] symptomatic.  Likely due to iron deficiency - from etiology GI blood loss/menorrhagia/malabsorption.  Poor tolerance/lack of improvement on oral iron.  Discussed regarding IV iron infusion/Venofer. Discussed the potential acute infusion reactions  with IV iron; which are quite rare.  Patient understands the risk; will proceed with infusions. Urine pregnancy test.  #Etiology of iron deficiency: s/p  IUD-Heavy  Menstrual cycles- Amy Beverely Pace; GYN [[facility]]- recommended    # Hx of right RCC [incidental- Dr.raynor- [facility].Duet ] s/p partial nephrectomy- CT- [ Oct 2024- [facility]]  # Chronic intermitent diarrhea 1-2 a day- ? Iron- off; recommend GI evaluation- [facility].   # vit D def- [Dr.Shah/PCP]-   Thank you Dr.Scott  for allowing me to participate in the care of your pleasant patient. Please do not hesitate to contact me with questions or concerns in the interim.  # DISPOSITION: #  no labs labs today;  # weekly venofer x3 - start this week ASAP; need urine pregnancy test- with first infusion # follow up 6  weeks- MD; labs- cbc/bmp;urine pregnancy possible venofer Dr.B  All questions were answered. The patient knows to call the clinic with any problems, questions or concerns.    Earna Coder, MD 01/21/2023 4:09 PM

## 2023-01-21 NOTE — Progress Notes (Signed)
Dr Debarah Crape at [facility] is following her for [facility]. Had partial nephrectomy in March. Hemoglobin never really improved since that surgery according to patient. Has chronic hemorrhoids. Has dyspnea, fatigue. Pt has a hx of anemia for a while. Saw Dr Merlene Pulling in the past. Has heavy menses.

## 2023-01-21 NOTE — Assessment & Plan Note (Addendum)
#   Anemia- Hb- 10.5; Ferritin- 8.5 [PCP- APRIL 2024] symptomatic.  Likely due to iron deficiency - from etiology GI blood loss/menorrhagia/malabsorption.  Poor tolerance/lack of improvement on oral iron.  Discussed regarding IV iron infusion/Venofer. Discussed the potential acute infusion reactions with IV iron; which are quite rare.  Patient understands the risk; will proceed with infusions. Urine pregnancy test.  #Etiology of iron deficiency: s/p IUD-Heavy  Menstrual cycles- Amy Beverely Pace; GYN [[facility]]- recommended    # Hx of right RCC [incidental- Dr.raynor- [facility] ] s/p partial nephrectomy- CT- [ Oct 2024- [facility]]  # Chronic intermitent diarrhea 1-2 a day- ? Iron- off; recommend GI evaluation- [facility].   # vit D def- [Dr.Shah/PCP]-   Thank you Dr.Scott  for allowing me to participate in the care of your pleasant patient. Please do not hesitate to contact me with questions or concerns in the interim.  # DISPOSITION: #  no labs labs today;  # weekly venofer x3 - start this week ASAP; need urine pregnancy test- with first infusion # follow up 6  weeks- MD; labs- cbc/bmp;urine pregnancy possible venofer Dr.B

## 2023-01-24 ENCOUNTER — Other Ambulatory Visit: Payer: BC Managed Care – PPO

## 2023-01-24 ENCOUNTER — Inpatient Hospital Stay: Payer: BC Managed Care – PPO

## 2023-01-25 ENCOUNTER — Other Ambulatory Visit: Payer: BC Managed Care – PPO

## 2023-01-25 ENCOUNTER — Ambulatory Visit: Payer: BC Managed Care – PPO

## 2023-01-28 ENCOUNTER — Ambulatory Visit
Admission: RE | Admit: 2023-01-28 | Discharge: 2023-01-28 | Disposition: A | Discharge status: Home / Self Care | Payer: BC Managed Care – PPO | Source: Ambulatory Visit | Attending: Internal Medicine | Admitting: Internal Medicine

## 2023-01-28 DIAGNOSIS — I2510 Atherosclerotic heart disease of native coronary artery without angina pectoris: Secondary | ICD-10-CM | POA: Insufficient documentation

## 2023-01-31 ENCOUNTER — Inpatient Hospital Stay: Payer: BC Managed Care – PPO

## 2023-01-31 ENCOUNTER — Other Ambulatory Visit: Payer: BC Managed Care – PPO

## 2023-01-31 VITALS — BP 144/73 | HR 95 | Temp 98.8°F | Resp 17

## 2023-01-31 DIAGNOSIS — D509 Iron deficiency anemia, unspecified: Secondary | ICD-10-CM

## 2023-01-31 MED ORDER — SODIUM CHLORIDE 0.9 % IV SOLN
200.0000 mg | Freq: Once | INTRAVENOUS | Status: DC
  Filled 2023-01-31: qty 10

## 2023-01-31 MED ORDER — SODIUM CHLORIDE 0.9 % IV SOLN
Freq: Once | INTRAVENOUS | Status: AC
  Filled 2023-01-31: qty 250

## 2023-01-31 MED ORDER — SODIUM CHLORIDE 0.9% FLUSH
3.0000 mL | Freq: Once | INTRAVENOUS | Status: DC | PRN
  Filled 2023-01-31: qty 3

## 2023-01-31 MED ORDER — SODIUM CHLORIDE 0.9% FLUSH
10.0000 mL | Freq: Once | INTRAVENOUS | Status: AC | PRN
  Administered 2023-01-31: 10 mL
  Filled 2023-01-31: qty 10

## 2023-01-31 MED ORDER — SODIUM CHLORIDE 0.9 % IV SOLN
200.0000 mg | Freq: Once | INTRAVENOUS | Status: AC
  Administered 2023-01-31: 200 mg via INTRAVENOUS
  Filled 2023-01-31: qty 10

## 2023-01-31 NOTE — Patient Instructions (Signed)

## 2023-01-31 NOTE — Progress Notes (Signed)
Error in Venofer rate order. Per MD and pharmacy ok to run infusion at full rate. Patient tolerated first infusion well, all questions/concerns addressed. Patient very anxious. Monitored 30 min post transfusion. Patient stable at discharge. VSS. AVS given.

## 2023-02-06 ENCOUNTER — Inpatient Hospital Stay: Payer: BC Managed Care – PPO

## 2023-02-06 ENCOUNTER — Inpatient Hospital Stay: Payer: BC Managed Care – PPO | Attending: Internal Medicine

## 2023-02-06 VITALS — BP 162/84 | HR 91 | Temp 99.6°F | Resp 19

## 2023-02-06 DIAGNOSIS — Z85528 Personal history of other malignant neoplasm of kidney: Secondary | ICD-10-CM | POA: Diagnosis not present

## 2023-02-06 DIAGNOSIS — D509 Iron deficiency anemia, unspecified: Secondary | ICD-10-CM | POA: Insufficient documentation

## 2023-02-06 DIAGNOSIS — E559 Vitamin D deficiency, unspecified: Secondary | ICD-10-CM | POA: Diagnosis not present

## 2023-02-06 DIAGNOSIS — Z08 Encounter for follow-up examination after completed treatment for malignant neoplasm: Secondary | ICD-10-CM | POA: Diagnosis not present

## 2023-02-06 DIAGNOSIS — I10 Essential (primary) hypertension: Secondary | ICD-10-CM | POA: Insufficient documentation

## 2023-02-06 DIAGNOSIS — Z87440 Personal history of urinary (tract) infections: Secondary | ICD-10-CM | POA: Insufficient documentation

## 2023-02-06 MED ORDER — SODIUM CHLORIDE 0.9 % IV SOLN
Freq: Once | INTRAVENOUS | Status: AC
  Filled 2023-02-06: qty 250

## 2023-02-06 MED ORDER — SODIUM CHLORIDE 0.9 % IV SOLN
200.0000 mg | Freq: Once | INTRAVENOUS | Status: AC
  Administered 2023-02-06: 200 mg via INTRAVENOUS
  Filled 2023-02-06: qty 200

## 2023-02-13 MED FILL — Iron Sucrose Inj 20 MG/ML (Fe Equiv): INTRAVENOUS | Qty: 10 | Status: AC

## 2023-02-14 ENCOUNTER — Ambulatory Visit: Payer: BC Managed Care – PPO

## 2023-02-14 ENCOUNTER — Inpatient Hospital Stay: Payer: BC Managed Care – PPO

## 2023-02-14 ENCOUNTER — Encounter: Payer: Self-pay | Admitting: Internal Medicine

## 2023-02-15 ENCOUNTER — Other Ambulatory Visit: Payer: Self-pay | Admitting: Internal Medicine

## 2023-02-18 ENCOUNTER — Other Ambulatory Visit: Payer: Self-pay | Admitting: Internal Medicine

## 2023-02-19 MED ORDER — FLUTICASONE PROPIONATE 50 MCG/ACT NA SUSP: 1.0000 | Freq: Every day | NASAL | 0 refills | Status: DC

## 2023-03-01 ENCOUNTER — Other Ambulatory Visit: Payer: Self-pay | Admitting: *Deleted

## 2023-03-01 ENCOUNTER — Telehealth: Payer: BC Managed Care – PPO | Admitting: Internal Medicine

## 2023-03-01 ENCOUNTER — Encounter: Payer: Self-pay | Admitting: Internal Medicine

## 2023-03-01 DIAGNOSIS — I10 Essential (primary) hypertension: Secondary | ICD-10-CM | POA: Diagnosis not present

## 2023-03-01 DIAGNOSIS — E7800 Pure hypercholesterolemia, unspecified: Secondary | ICD-10-CM | POA: Diagnosis not present

## 2023-03-01 DIAGNOSIS — E1165 Type 2 diabetes mellitus with hyperglycemia: Secondary | ICD-10-CM | POA: Diagnosis not present

## 2023-03-01 DIAGNOSIS — K219 Gastro-esophageal reflux disease without esophagitis: Secondary | ICD-10-CM

## 2023-03-01 DIAGNOSIS — N390 Urinary tract infection, site not specified: Secondary | ICD-10-CM

## 2023-03-01 DIAGNOSIS — D75839 Thrombocytosis, unspecified: Secondary | ICD-10-CM

## 2023-03-01 DIAGNOSIS — C649 Malignant neoplasm of unspecified kidney, except renal pelvis: Secondary | ICD-10-CM

## 2023-03-01 DIAGNOSIS — D509 Iron deficiency anemia, unspecified: Secondary | ICD-10-CM

## 2023-03-01 NOTE — Progress Notes (Deleted)
Subjective:    Patient ID: Lindsey Strickland, female    DOB: 1976/03/07, 47 y.o.   MRN: [id number]  Patient here for No chief complaint on file.   HPI Here for follow up appt - regarding diabetes, hypertension and hypercholesterolemia.  S/p partial right nephrectomy - for RCC. Persistent anemia.  Saw dr Donneta Romberg 01/21/23.  Iron infusions. Evaluated 02/14/23 - acute care - "not feeling well". Reported fever and chills. Treated with omnicef - concern regarding UTI. Evaluated [facility] - recurring UTIs.  Recommended topical estrogen and hydration.    Past Medical History:  Diagnosis Date   Atypical endometrial hyperplasia    Diabetes mellitus (HCC)    Diarrhea    Environmental allergies    Generalized anxiety disorder    GERD (gastroesophageal reflux disease)    Hypercholesterolemia    Hypertension    Insomnia    Major depressive disorder    Past Surgical History:  Procedure Laterality Date   COLONOSCOPY     COLONOSCOPY WITH PROPOFOL N/A 05/28/2018   Procedure: COLONOSCOPY WITH PROPOFOL;  Surgeon: Scot Jun, MD;  Location: [facility] ENDOSCOPY;  Service: Endoscopy;  Laterality: N/A;   ESOPHAGOGASTRODUODENOSCOPY (EGD) WITH PROPOFOL N/A 05/28/2018   Procedure: ESOPHAGOGASTRODUODENOSCOPY (EGD) WITH PROPOFOL;  Surgeon: Scot Jun, MD;  Location: [facility] ENDOSCOPY;  Service: Endoscopy;  Laterality: N/A;   HYSTEROSCOPY WITH D & C     and polyp removal   Family History  Problem Relation Age of Onset   Hypertension Mother    Diabetes Mother    Diverticulosis Mother    Depression Mother    Hypercholesterolemia Father    Hypertension Father    Cancer Father    Heart disease Maternal Grandfather    Diverticulosis Paternal Grandmother    Hyperlipidemia Paternal Grandmother    Heart failure Paternal Grandmother    Cancer Maternal Grandmother    Alcoholism Paternal Uncle    Social History   Socioeconomic History   Marital status: Single    Spouse name: Not on file   Number of  children: 2   Years of education: Not on file   Highest education level: High school graduate  Occupational History    Employer: [employer]  Tobacco Use   Smoking status: Former    Current packs/day: 0.00    Types: Cigarettes    Quit date: 08/06/2000    Years since quitting: 22.5   Smokeless tobacco: Never  Vaping Use   Vaping status: Never Used  Substance and Sexual Activity   Alcohol use: Yes    Alcohol/week: 0.0 standard drinks of alcohol    Comment: rare   Drug use: No   Sexual activity: Yes    Birth control/protection: I.U.D.  Other Topics Concern   Not on file  Social History Narrative   Not on file   Social Determinants of Health   Financial Resource Strain: Patient Declined (11/29/2022)   Overall Financial Resource Strain (CARDIA)    Difficulty of Paying Living Expenses: Patient declined  Food Insecurity: No Food Insecurity (01/21/2023)   Hunger Vital Sign    Worried About Running Out of Food in the Last Year: Never true    Ran Out of Food in the Last Year: Never true  Transportation Needs: No Transportation Needs (01/21/2023)   PRAPARE - Administrator, Civil Service (Medical): No    Lack of Transportation (Non-Medical): No  Physical Activity: Unknown (11/29/2022)   Exercise Vital Sign    Days of Exercise per  Week: Patient declined    Minutes of Exercise per Session: Not on file  Stress: Stress Concern Present (11/29/2022)   [employer] of Occupational Health - Occupational Stress Questionnaire    Feeling of Stress : Rather much  Social Connections: Unknown (11/29/2022)   Social Connection and Isolation Panel [NHANES]    Frequency of Communication with Friends and Family: Patient declined    Frequency of Social Gatherings with Friends and Family: Patient declined    Attends Religious Services: Patient declined    Database administrator or Organizations: Patient declined    Attends Banker Meetings: Not on file    Marital Status:  Patient declined     Review of Systems     Objective:     There were no vitals taken for this visit. Wt Readings from Last 3 Encounters:  11/29/22 297 lb (134.7 kg)  09/06/22 (!) 302 lb (137 kg)  07/19/22 (!) 302 lb (137 kg)    Physical Exam   Outpatient Encounter Medications as of 03/01/2023  Medication Sig   albuterol (VENTOLIN HFA) 108 (90 Base) MCG/ACT inhaler Inhale 2 puffs into the lungs every 6 (six) hours as needed for wheezing. Patient request ProAir brand only.   amLODipine (NORVASC) 10 MG tablet Take 1/2 (one-half) tablet by mouth twice daily   Cholecalciferol (VITAMIN D3) 50 MCG (2000 UT) capsule Take 2,000 Units by mouth daily.   famotidine (PEPCID) 20 MG tablet TAKE 2 TABLETS (40 MG) BY MOUTH NIGHTLY   Fe Fum-FePoly-Vit C-Vit B3 (INTEGRA) 62.5-62.5-40-3 MG CAPS Take 1 capsule by mouth once daily   fluticasone (FLONASE) 50 MCG/ACT nasal spray Place 1 spray into both nostrils daily.   glucose blood test strip Use as directed to check sugars BID. Dx E11.9   Lancets MISC by Does not apply route. Use 1 lancets three times a day   levonorgestrel (MIRENA) 20 MCG/DAY IUD by Intrauterine route.   loratadine (CLARITIN) 10 MG tablet Take 10 mg by mouth daily.   losartan (COZAAR) 100 MG tablet Take 1 tablet (100 mg total) by mouth daily.   metFORMIN (GLUCOPHAGE) 500 MG tablet Take 2 tablets (1,000 mg total) by mouth 2 (two) times daily.   metoprolol succinate (TOPROL-XL) 100 MG 24 hr tablet Take 0.5 tablets (50 mg total) by mouth 2 (two) times daily. Take with or immediately following a meal.   pravastatin (PRAVACHOL) 40 MG tablet Take 1 tablet (40 mg total) by mouth daily.   No facility-administered encounter medications on file as of 03/01/2023.     Lab Results  Component Value Date   WBC 11.0 (H) 01/14/2023   HGB 10.7 (L) 01/14/2023   HCT 34.4 (L) 01/14/2023   PLT 479.0 (H) 01/14/2023   GLUCOSE 110 (H) 11/29/2022   CHOL 190 07/24/2022   TRIG 194.0 (H) 07/24/2022    HDL 50.20 07/24/2022   LDLDIRECT 132.0 11/02/2020   LDLCALC 101 (H) 07/24/2022   ALT 14 11/29/2022   AST 12 11/29/2022   NA 138 11/29/2022   K 4.8 11/29/2022   CL 101 11/29/2022   CREATININE 0.73 11/29/2022   BUN 17 11/29/2022   CO2 24 11/29/2022   TSH 5.06 07/24/2022   INR 0.93 05/05/2018   HGBA1C 7.3 (H) 11/29/2022   MICROALBUR 13.4 (H) 07/24/2022    CT CARDIAC SCORING (SELF PAY ONLY)  Addendum Date: 02/01/2023   ADDENDUM REPORT: 02/01/2023 16:28 EXAM: OVER-READ INTERPRETATION  CT CHEST The following report is an over-read performed by radiologist Dr. Shawna Orleans  [facility], [place] on 02/01/2023. This over-read does not include interpretation of cardiac or coronary anatomy or pathology. The coronary calcium score interpretation by the cardiologist is attached. COMPARISON:  None. FINDINGS: Vascular: No aortic atherosclerosis. The included aorta is normal in caliber. Mediastinum/nodes: No adenopathy or mass.  Minimal hiatal hernia. Lungs: No focal airspace disease. No pulmonary nodule. No pleural fluid. Scattered thin walled pulmonary cysts. The included airways are patent. Upper abdomen: No acute findings.  Hepatic steatosis. Musculoskeletal: There are no acute or suspicious osseous abnormalities. IMPRESSION: 1. Minimal hiatal hernia. 2. Hepatic steatosis. Electronically Signed   By: Narda Rutherford M.D.   On: 02/01/2023 16:28   Result Date: 02/01/2023 CLINICAL DATA:  Risk stratification EXAM: Coronary Calcium Score TECHNIQUE: The patient was scanned on a Siemens Somatom scanner. Axial non-contrast 3 mm slices were carried out through the heart. The data set was analyzed on a dedicated work station and scored using the Agatson method. FINDINGS: Non-cardiac: See separate report from [facility]. Ascending Aorta: Normal size Pericardium: Normal Coronary arteries: Normal origin of left and right coronary arteries. Distribution of arterial calcifications if present, as noted  below; LM 0 LAD 0 LCx 0 RCA 0 Total 0 IMPRESSION AND RECOMMENDATION: 1. Coronary calcium score of 0. 2. CAC 0, CAC-DRS A0. 3. Continue heart healthy lifestyle and risk factor modification. Electronically Signed: By: Debbe Odea M.D. On: 01/28/2023 16:46       Assessment & Plan:  There are no diagnoses linked to this encounter.   Dale Nogal, MD

## 2023-03-03 ENCOUNTER — Encounter: Payer: Self-pay | Admitting: Internal Medicine

## 2023-03-03 NOTE — Assessment & Plan Note (Signed)
Last A1c 7.3.  due labs. Have discussed diet and exercise.  She plans to get more serious about diet and exercise. Discussed importance of getting sugars under control.  Have discussed medications.  Has desired not to add medications.  Follow met b and A1c.  Recheck with scheduled labs through oncology.

## 2023-03-03 NOTE — Assessment & Plan Note (Signed)
On prilosec.  Continue.

## 2023-03-03 NOTE — Assessment & Plan Note (Addendum)
Has been evaluated by hematology.  Felt to be reactive.  Hematology following cbc.  Receiving iron infusions.

## 2023-03-03 NOTE — Progress Notes (Signed)
Patient ID: Lindsey Strickland, female   DOB: [birth year], 47 y.o.   MRN: [id number]   Virtual Visit via video Note  I connected with Lindsey Strickland by a video enabled telemedicine application and verified that I am speaking with the correct person using two identifiers. Location patient: home Location provider: work  Persons participating in the virtual visit: patient, provider  The limitations, risks, security and privacy concerns of performing an evaluation and management service by video and the availability of in person appointments have been discussed.  It has also been discussed with the patient that there may be a patient responsible charge related to this service. The patient expressed understanding and agreed to proceed.   Reason for visit: follow up appt  HPI: Follow up appt - regarding diabetes, hypertension and hypercholesterolemia.  S/p partial right nephrectomy - for RCC. Persistent anemia.  Saw Dr Donneta Romberg 01/21/23.  Iron infusions. Still fatigued. Evaluated 02/14/23 - acute care - "not feeling well". Reported fever and chills. Treated with omnicef - concern regarding UTI. Evaluated [facility] - recurring UTIs. On keflex.  Recommended topical estrogen and hydration. Does report increased stress.  Stress with her daughter.  Discussed.  Therapist.  No chest pain reported.  Had recent CT - calcium score 0.  No increased cough or congestion reported.  She reports blood pressures averaging 140/70s.  No sugar readings.  Discussed diet and exercise.     ROS: See pertinent positives and negatives per HPI.  Past Medical History:  Diagnosis Date   Atypical endometrial hyperplasia    Diabetes mellitus (HCC)    Diarrhea    Environmental allergies    Generalized anxiety disorder    GERD (gastroesophageal reflux disease)    Hypercholesterolemia    Hypertension    Insomnia    Major depressive disorder     Past Surgical History:  Procedure Laterality Date   COLONOSCOPY     COLONOSCOPY WITH  PROPOFOL N/A 05/28/2018   Procedure: COLONOSCOPY WITH PROPOFOL;  Surgeon: Scot Jun, MD;  Location: [facility] ENDOSCOPY;  Service: Endoscopy;  Laterality: N/A;   ESOPHAGOGASTRODUODENOSCOPY (EGD) WITH PROPOFOL N/A 05/28/2018   Procedure: ESOPHAGOGASTRODUODENOSCOPY (EGD) WITH PROPOFOL;  Surgeon: Scot Jun, MD;  Location: [facility] ENDOSCOPY;  Service: Endoscopy;  Laterality: N/A;   HYSTEROSCOPY WITH D & C     and polyp removal    Family History  Problem Relation Age of Onset   Hypertension Mother    Diabetes Mother    Diverticulosis Mother    Depression Mother    Hypercholesterolemia Father    Hypertension Father    Cancer Father    Heart disease Maternal Grandfather    Diverticulosis Paternal Grandmother    Hyperlipidemia Paternal Grandmother    Heart failure Paternal Grandmother    Cancer Maternal Grandmother    Alcoholism Paternal Uncle     SOCIAL HX: reviewed.    Current Outpatient Medications:    albuterol (VENTOLIN HFA) 108 (90 Base) MCG/ACT inhaler, Inhale 2 puffs into the lungs every 6 (six) hours as needed for wheezing. Patient request ProAir brand only., Disp: 18 g, Rfl: 2   amLODipine (NORVASC) 10 MG tablet, Take 1/2 (one-half) tablet by mouth twice daily, Disp: 90 tablet, Rfl: 2   Cholecalciferol (VITAMIN D3) 50 MCG (2000 UT) capsule, Take 2,000 Units by mouth daily., Disp: , Rfl:    famotidine (PEPCID) 20 MG tablet, TAKE 2 TABLETS (40 MG) BY MOUTH NIGHTLY, Disp: , Rfl:    fluticasone (FLONASE) 50 MCG/ACT nasal spray, Place  1 spray into both nostrils daily., Disp: 16 g, Rfl: 0   glucose blood test strip, Use as directed to check sugars BID. Dx E11.9, Disp: 100 each, Rfl: 12   Lancets MISC, by Does not apply route. Use 1 lancets three times a day, Disp: , Rfl:    levonorgestrel (MIRENA) 20 MCG/DAY IUD, by Intrauterine route., Disp: , Rfl:    loratadine (CLARITIN) 10 MG tablet, Take 10 mg by mouth daily., Disp: , Rfl:    losartan (COZAAR) 100 MG tablet, Take 1  tablet (100 mg total) by mouth daily., Disp: 90 tablet, Rfl: 2   metFORMIN (GLUCOPHAGE) 500 MG tablet, Take 2 tablets (1,000 mg total) by mouth 2 (two) times daily., Disp: 360 tablet, Rfl: 1   metoprolol succinate (TOPROL-XL) 100 MG 24 hr tablet, Take 0.5 tablets (50 mg total) by mouth 2 (two) times daily. Take with or immediately following a meal., Disp: 90 tablet, Rfl: 2   pravastatin (PRAVACHOL) 40 MG tablet, Take 1 tablet (40 mg total) by mouth daily., Disp: 90 tablet, Rfl: 1  EXAM:  GENERAL: alert, oriented, appears well and in no acute distress  HEENT: atraumatic, conjunttiva clear, no obvious abnormalities on inspection of external nose and ears  NECK: normal movements of the head and neck  LUNGS: on inspection no signs of respiratory distress, breathing rate appears normal, no obvious gross SOB, gasping or wheezing  CV: no obvious cyanosis  PSYCH/NEURO: pleasant and cooperative, no obvious depression or anxiety, speech and thought processing grossly intact  ASSESSMENT AND PLAN:  Discussed the following assessment and plan:  Problem List Items Addressed This Visit     Thrombocytosis (HCC)    Has been evaluated by hematology.  Felt to be reactive.  Hematology following cbc.  Receiving iron infusions.       Severe obesity (BMI >= 40) (HCC)    Discussed diet and exercise.  Follow.       Renal cell carcinoma (HCC)    S/p recent partial nephrectomy as outlined.  Continues f/u with urology.        Hypertension    Continue losartan, metoprolol and amlodipine.  Blood pressures as outlined.  Elevated above goal.  Has desired not to add medication. Follow pressures.  Follow metabolic panel.        Hypercholesterolemia    Continue pravastatin.  Diet and exercise.  Check lipid panel and liver function tests. Had previously discussed changing to crestor.  Had wanted to hold on change.  Check lipid panel with upcoming labs.       GERD (gastroesophageal reflux disease)    On  prilosec.  Continue.        Frequent UTI    Evaluated [facility] - recurring UTIs. On keflex.  Recommended topical estrogen and hydration. On keflex.  Follow.       Diabetes mellitus (HCC) - Primary    Last A1c 7.3.  due labs. Have discussed diet and exercise.  She plans to get more serious about diet and exercise. Discussed importance of getting sugars under control.  Have discussed medications.  Has desired not to add medications.  Follow met b and A1c.  Recheck with scheduled labs through oncology.        No follow-ups on file.   I discussed the assessment and treatment plan with the patient. The patient was provided an opportunity to ask questions and all were answered. The patient agreed with the plan and demonstrated an understanding of the instructions.   The  patient was advised to call back or seek an in-person evaluation if the symptoms worsen or if the condition fails to improve as anticipated.    Dale [place], MD

## 2023-03-03 NOTE — Assessment & Plan Note (Signed)
Continue pravastatin.  Diet and exercise.  Check lipid panel and liver function tests. Had previously discussed changing to crestor.  Had wanted to hold on change.  Check lipid panel with upcoming labs.

## 2023-03-03 NOTE — Assessment & Plan Note (Signed)
Evaluated [facility] - recurring UTIs. On keflex.  Recommended topical estrogen and hydration. On keflex.  Follow.

## 2023-03-03 NOTE — Assessment & Plan Note (Addendum)
S/p recent partial nephrectomy as outlined.  Continues f/u with urology.

## 2023-03-03 NOTE — Assessment & Plan Note (Signed)
Continue losartan, metoprolol and amlodipine.  Blood pressures as outlined.  Elevated above goal.  Has desired not to add medication. Follow pressures.  Follow metabolic panel.

## 2023-03-03 NOTE — Assessment & Plan Note (Signed)
Discussed diet and exercise.  Follow.  

## 2023-03-05 ENCOUNTER — Other Ambulatory Visit: Payer: BC Managed Care – PPO

## 2023-03-05 ENCOUNTER — Ambulatory Visit: Payer: BC Managed Care – PPO

## 2023-03-05 ENCOUNTER — Other Ambulatory Visit: Payer: Self-pay

## 2023-03-05 ENCOUNTER — Inpatient Hospital Stay (HOSPITAL_BASED_OUTPATIENT_CLINIC_OR_DEPARTMENT_OTHER): Payer: BC Managed Care – PPO | Admitting: Internal Medicine

## 2023-03-05 ENCOUNTER — Ambulatory Visit: Payer: BC Managed Care – PPO | Admitting: Internal Medicine

## 2023-03-05 ENCOUNTER — Inpatient Hospital Stay: Payer: BC Managed Care – PPO

## 2023-03-05 ENCOUNTER — Encounter: Payer: Self-pay | Admitting: Internal Medicine

## 2023-03-05 VITALS — BP 166/77 | HR 94 | Temp 99.0°F | Resp 18

## 2023-03-05 VITALS — BP 181/89 | HR 92 | Temp 99.3°F | Ht 68.0 in | Wt 303.4 lb

## 2023-03-05 DIAGNOSIS — E1165 Type 2 diabetes mellitus with hyperglycemia: Secondary | ICD-10-CM | POA: Diagnosis not present

## 2023-03-05 DIAGNOSIS — D509 Iron deficiency anemia, unspecified: Secondary | ICD-10-CM

## 2023-03-05 DIAGNOSIS — D649 Anemia, unspecified: Secondary | ICD-10-CM

## 2023-03-05 LAB — BASIC METABOLIC PANEL - CANCER CENTER ONLY
Anion gap: 11 (ref 5–15)
BUN: 17 mg/dL (ref 6–20)
CO2: 24 mmol/L (ref 22–32)
Calcium: 9.7 mg/dL (ref 8.9–10.3)
Chloride: 103 mmol/L (ref 98–111)
Creatinine: 0.61 mg/dL (ref 0.44–1.00)
GFR, Estimated: >60 mL/min (ref >60)
Glucose, Bld: 128 mg/dL — ABNORMAL HIGH (ref 70–99)
Potassium: 4.5 mmol/L (ref 3.5–5.1)
Sodium: 138 mmol/L (ref 135–145)

## 2023-03-05 LAB — CBC WITH DIFFERENTIAL (CANCER CENTER ONLY)
Abs Immature Granulocytes: 0.04 10*3/uL (ref 0.00–0.07)
Basophils Absolute: 0.1 10*3/uL (ref 0.0–0.1)
Basophils Relative: 1 %
Eosinophils Absolute: 0.2 10*3/uL (ref 0.0–0.5)
Eosinophils Relative: 2 %
HCT: 35.2 % — ABNORMAL LOW (ref 36.0–46.0)
Hemoglobin: 11.1 g/dL — ABNORMAL LOW (ref 12.0–15.0)
Immature Granulocytes: 0 %
Lymphocytes Relative: 29 %
Lymphs Abs: 3.1 10*3/uL (ref 0.7–4.0)
MCH: 25.9 pg — ABNORMAL LOW (ref 26.0–34.0)
MCHC: 31.5 g/dL (ref 30.0–36.0)
MCV: 82.1 fL (ref 80.0–100.0)
Monocytes Absolute: 0.7 10*3/uL (ref 0.1–1.0)
Monocytes Relative: 6 %
Neutro Abs: 6.7 10*3/uL (ref 1.7–7.7)
Neutrophils Relative %: 62 %
Platelet Count: 406 10*3/uL — ABNORMAL HIGH (ref 150–400)
RBC: 4.29 MIL/uL (ref 3.87–5.11)
RDW: 17.1 % — ABNORMAL HIGH (ref 11.5–15.5)
WBC Count: 10.8 10*3/uL — ABNORMAL HIGH (ref 4.0–10.5)
nRBC: 0 % (ref 0.0–0.2)

## 2023-03-05 LAB — LIPID PANEL
Cholesterol: 183 mg/dL (ref 0–200)
HDL: 43 mg/dL (ref >40)
LDL Cholesterol: 88 mg/dL (ref 0–99)
Total CHOL/HDL Ratio: 4.3 RATIO
Triglycerides: 261 mg/dL — ABNORMAL HIGH (ref <150)
VLDL: 52 mg/dL — ABNORMAL HIGH (ref 0–40)

## 2023-03-05 LAB — HEMOGLOBIN A1C
Hgb A1c MFr Bld: 7.5 % — ABNORMAL HIGH (ref 4.8–5.6)
Mean Plasma Glucose: 168.55 mg/dL

## 2023-03-05 MED ORDER — SODIUM CHLORIDE 0.9 % IV SOLN
Freq: Once | INTRAVENOUS | Status: AC
  Filled 2023-03-05: qty 250

## 2023-03-05 MED ORDER — SODIUM CHLORIDE 0.9 % IV SOLN
200.0000 mg | Freq: Once | INTRAVENOUS | Status: AC
  Administered 2023-03-05: 200 mg via INTRAVENOUS
  Filled 2023-03-05: qty 200

## 2023-03-05 NOTE — Progress Notes (Signed)
Being tx for uti x2, on keflex.  Fatigue/weakness: no Dyspena: no Light headedness: no Blood in stool: no

## 2023-03-05 NOTE — Progress Notes (Signed)
patient refused HCG Urine sample said she has an IUD for birth control and her husband has had a vasectomy.

## 2023-03-05 NOTE — Assessment & Plan Note (Addendum)
#   Anemia- Hb- 10.5; Ferritin- 8.5 [PCP- APRIL 2024] symptomatic.  Likely due to iron deficiency -menorrhagia.  Poor tolerance/lack of improvement on oral iron.    # Today hemoglobin 11.1.  Proceed with infusion.  #Etiology of iron deficiency: s/p IUD-Heavy  Menstrual cycles- Amy Bryant; GYN [[facility]]- recommended ; colo 2019- internal bleed [[facility]]   # Hx of right RCC [incidental- Dr.raynor- [facility].Duet ] s/p partial nephrectomy- CT- [ Oct 2024- [facility]]  # Recurrent UTI-[urology]- [facility]-   # Chronic intermitent diarrhea 1-2 a day- ? Iron- off; recommend GI evaluation- [facility].  # vit D def- [Dr.Shah/PCP]-  # HTN-systolic 160s to 413K.  Likely anxiety.  Recommend checking blood pressure at home.  I  # DISPOSITION: # repeat BP  # venofer today # in 1 week - venofer  # follow up 3 month MD; labs- cbc/bmp; iron studies; ferritin;  possible venofer -Dr.B

## 2023-03-05 NOTE — Progress Notes (Signed)
[facility] CONSULT NOTE  Patient Care Team: Dale Burkburnett, MD as PCP - General (Internal Medicine)  CHIEF COMPLAINTS/PURPOSE OF CONSULTATION: ANEMIA   HEMATOLOGY HISTORY  # ANEMIA[Hb; MCV-platelets- WBC; Iron sat; ferritin;  GFR- CT/US- ;  EGD/colonoscopy-   Latest Reference Range & Units 11/29/22 13:57  Iron 42 - 145 ug/dL 38 (L)  TIBC 098.1 - 191.4 mcg/dL 782.9 (H)  Saturation Ratios 20.0 - 50.0 % 8.4 (L)  Ferritin 10.0 - 291.0 ng/mL 8.5 (L)  Transferrin 212.0 - 360.0 mg/dL 562.1  (L): Data is abnormally low (H): Data is abnormally high  HISTORY OF PRESENTING ILLNESS: Patient ambulating-independently.  Alone/Accompanied by family.    Lindsey GUIST 47 y.o.  female pleasant patient staging kidney cancer and also history of heavy menstrual cycles/iron deficient anemia is here for follow-up.  Patient s/p IV iron infusion.  Notes to have improvement of energy levels but still fatigued.   Patient refused HCG Urine sample said she has an IUD for birth control and her husband has had a vasectomy.    Review of Systems  Constitutional:  Positive for malaise/fatigue. Negative for chills, diaphoresis, fever and weight loss.  HENT:  Negative for nosebleeds and sore throat.   Eyes:  Negative for double vision.  Respiratory:  Positive for shortness of breath. Negative for cough, hemoptysis, sputum production and wheezing.   Cardiovascular:  Negative for chest pain, palpitations, orthopnea and leg swelling.  Gastrointestinal:  Negative for abdominal pain, blood in stool, constipation, diarrhea, heartburn, melena, nausea and vomiting.  Genitourinary:  Negative for dysuria, frequency and urgency.  Musculoskeletal:  Negative for back pain and joint pain.  Skin: Negative.  Negative for itching and rash.  Neurological:  Positive for dizziness. Negative for tingling, focal weakness, weakness and headaches.  Endo/Heme/Allergies:  Does not bruise/bleed easily.   Psychiatric/Behavioral:  Negative for depression. The patient is not nervous/anxious and does not have insomnia.      MEDICAL HISTORY:  Past Medical History:  Diagnosis Date   Atypical endometrial hyperplasia    Diabetes mellitus (HCC)    Diarrhea    Environmental allergies    Generalized anxiety disorder    GERD (gastroesophageal reflux disease)    Hypercholesterolemia    Hypertension    Insomnia    Major depressive disorder     SURGICAL HISTORY: Past Surgical History:  Procedure Laterality Date   COLONOSCOPY     COLONOSCOPY WITH PROPOFOL N/A 05/28/2018   Procedure: COLONOSCOPY WITH PROPOFOL;  Surgeon: Scot Jun, MD;  Location: [facility] ENDOSCOPY;  Service: Endoscopy;  Laterality: N/A;   ESOPHAGOGASTRODUODENOSCOPY (EGD) WITH PROPOFOL N/A 05/28/2018   Procedure: ESOPHAGOGASTRODUODENOSCOPY (EGD) WITH PROPOFOL;  Surgeon: Scot Jun, MD;  Location: [facility] ENDOSCOPY;  Service: Endoscopy;  Laterality: N/A;   HYSTEROSCOPY WITH D & C     and polyp removal    SOCIAL HISTORY: Social History   Socioeconomic History   Marital status: Single    Spouse name: Not on file   Number of children: 2   Years of education: Not on file   Highest education level: High school graduate  Occupational History    Employer: [employer]  Tobacco Use   Smoking status: Former    Current packs/day: 0.00    Types: Cigarettes    Quit date: 08/06/2000    Years since quitting: 22.5   Smokeless tobacco: Never  Vaping Use   Vaping status: Never Used  Substance and Sexual Activity   Alcohol use: Yes  Alcohol/week: 0.0 standard drinks of alcohol    Comment: rare   Drug use: No   Sexual activity: Yes    Birth control/protection: I.U.D.  Other Topics Concern   Not on file  Social History Narrative   Not on file   Social Determinants of Health   Financial Resource Strain: Patient Declined (11/29/2022)   Overall Financial Resource Strain (CARDIA)    Difficulty of Paying Living  Expenses: Patient declined  Food Insecurity: No Food Insecurity (01/21/2023)   Hunger Vital Sign    Worried About Running Out of Food in the Last Year: Never true    Ran Out of Food in the Last Year: Never true  Transportation Needs: No Transportation Needs (01/21/2023)   PRAPARE - Administrator, Civil Service (Medical): No    Lack of Transportation (Non-Medical): No  Physical Activity: Unknown (11/29/2022)   Exercise Vital Sign    Days of Exercise per Week: Patient declined    Minutes of Exercise per Session: Not on file  Stress: Stress Concern Present (11/29/2022)   [employer] of Occupational Health - Occupational Stress Questionnaire    Feeling of Stress : Rather much  Social Connections: Unknown (11/29/2022)   Social Connection and Isolation Panel [NHANES]    Frequency of Communication with Friends and Family: Patient declined    Frequency of Social Gatherings with Friends and Family: Patient declined    Attends Religious Services: Patient declined    Database administrator or Organizations: Patient declined    Attends Banker Meetings: Not on file    Marital Status: Patient declined  Intimate Partner Violence: Not At Risk (01/21/2023)   Humiliation, Afraid, Rape, and Kick questionnaire    Fear of Current or Ex-Partner: No    Emotionally Abused: No    Physically Abused: No    Sexually Abused: No    FAMILY HISTORY: Family History  Problem Relation Age of Onset   Hypertension Mother    Diabetes Mother    Diverticulosis Mother    Depression Mother    Hypercholesterolemia Father    Hypertension Father    Cancer Father    Heart disease Maternal Grandfather    Diverticulosis Paternal Grandmother    Hyperlipidemia Paternal Grandmother    Heart failure Paternal Grandmother    Cancer Maternal Grandmother    Alcoholism Paternal Uncle     ALLERGIES:  is allergic to tetracycline, augmentin [amoxicillin-pot clavulanate], hctz [hydrochlorothiazide],  lisinopril, other, prednisone, and ciprofloxacin.  MEDICATIONS:  Current Outpatient Medications  Medication Sig Dispense Refill   cephALEXin (KEFLEX) 500 MG capsule Take 500 mg by mouth 2 (two) times daily.     albuterol (VENTOLIN HFA) 108 (90 Base) MCG/ACT inhaler Inhale 2 puffs into the lungs every 6 (six) hours as needed for wheezing. Patient request ProAir brand only. 18 g 2   amLODipine (NORVASC) 10 MG tablet Take 1/2 (one-half) tablet by mouth twice daily 90 tablet 2   Cholecalciferol (VITAMIN D3) 50 MCG (2000 UT) capsule Take 2,000 Units by mouth daily.     famotidine (PEPCID) 20 MG tablet TAKE 2 TABLETS (40 MG) BY MOUTH NIGHTLY     fluticasone (FLONASE) 50 MCG/ACT nasal spray Place 1 spray into both nostrils daily. 16 g 0   glucose blood test strip Use as directed to check sugars BID. Dx E11.9 100 each 12   Lancets MISC by Does not apply route. Use 1 lancets three times a day     levonorgestrel (MIRENA) 20 MCG/DAY  IUD by Intrauterine route.     loratadine (CLARITIN) 10 MG tablet Take 10 mg by mouth daily.     losartan (COZAAR) 100 MG tablet Take 1 tablet (100 mg total) by mouth daily. 90 tablet 2   metFORMIN (GLUCOPHAGE) 500 MG tablet Take 2 tablets (1,000 mg total) by mouth 2 (two) times daily. 360 tablet 1   metoprolol succinate (TOPROL-XL) 100 MG 24 hr tablet Take 0.5 tablets (50 mg total) by mouth 2 (two) times daily. Take with or immediately following a meal. 90 tablet 2   pravastatin (PRAVACHOL) 40 MG tablet Take 1 tablet (40 mg total) by mouth daily. 90 tablet 1   No current facility-administered medications for this visit.     PHYSICAL EXAMINATION:   Vitals:   03/05/23 1449 03/05/23 1523  BP: (!) 178/90 (!) 181/89  Pulse: 92   Temp: 99.6 F (37.6 C) 99.3 F (37.4 C)  SpO2: 100%    Filed Weights   03/05/23 1449  Weight: (!) 303 lb 6.4 oz (137.6 kg)    Physical Exam Vitals and nursing note reviewed.  HENT:     Head: Normocephalic and atraumatic.      Mouth/Throat:     Pharynx: Oropharynx is clear.  Eyes:     Extraocular Movements: Extraocular movements intact.     Pupils: Pupils are equal, round, and reactive to light.  Cardiovascular:     Rate and Rhythm: Normal rate and regular rhythm.  Pulmonary:     Comments: Decreased breath sounds bilaterally.  Abdominal:     Palpations: Abdomen is soft.  Musculoskeletal:        General: Normal range of motion.     Cervical back: Normal range of motion.  Skin:    General: Skin is warm.  Neurological:     General: No focal deficit present.     Mental Status: She is alert and oriented to person, place, and time.  Psychiatric:        Behavior: Behavior normal.        Judgment: Judgment normal.      LABORATORY DATA:  I have reviewed the data as listed Lab Results  Component Value Date   WBC 10.8 (H) 03/05/2023   HGB 11.1 (L) 03/05/2023   HCT 35.2 (L) 03/05/2023   MCV 82.1 03/05/2023   PLT 406 (H) 03/05/2023   Recent Labs    07/24/22 0817 11/29/22 1357 03/05/23 1500  NA 136 138 138  K 4.2 4.8 4.5  CL 100 101 103  CO2 26 24 24   GLUCOSE 167* 110* 128*  BUN 13 17 17   CREATININE 0.49 0.73 0.61  CALCIUM 9.2 9.7 9.7  GFRNONAA  --   --  >60  PROT 6.9 7.2  --   ALBUMIN 4.2 4.3  --   AST 17 12  --   ALT 22 14  --   ALKPHOS 61 69  --   BILITOT 0.3 0.3  --   BILIDIR 0.0 0.1  --      No results found.  ASSESSMENT & PLAN:   Symptomatic anemia # Anemia- Hb- 10.5; Ferritin- 8.5 [PCP- APRIL 2024] symptomatic.  Likely due to iron deficiency -menorrhagia.  Poor tolerance/lack of improvement on oral iron.    # Today hemoglobin 11.1.  Proceed with infusion.  #Etiology of iron deficiency: s/p IUD-Heavy  Menstrual cycles- Amy Bryant; GYN [[facility]]- recommended ; colo 2019- internal bleed [[facility]]   # Hx of right RCC [incidental- Dr.raynor- [facility] ] s/p partial nephrectomy-  CT- [ Oct 2024- [facility]]  # Recurrent UTI-[urology]- [facility]-   # Chronic intermitent diarrhea 1-2 a day- ? Iron- off;  recommend GI evaluation- [facility].  # vit D def- [Dr.Shah/PCP]-  # HTN-systolic 160s to 469G.  Likely anxiety.  Recommend checking blood pressure at home.  I  # DISPOSITION: # repeat BP  # venofer today # in 1 week - venofer  # follow up 3 month MD; labs- cbc/bmp; iron studies; ferritin;  possible venofer -Dr.B   All questions were answered. The patient knows to call the clinic with any problems, questions or concerns.    Earna Coder, MD 03/05/2023 3:46 PM

## 2023-03-12 ENCOUNTER — Inpatient Hospital Stay: Payer: BC Managed Care – PPO | Attending: Internal Medicine

## 2023-03-12 VITALS — BP 140/75 | HR 94 | Temp 98.9°F

## 2023-03-12 DIAGNOSIS — D509 Iron deficiency anemia, unspecified: Secondary | ICD-10-CM | POA: Diagnosis present

## 2023-03-12 MED ORDER — SODIUM CHLORIDE 0.9 % IV SOLN
200.0000 mg | Freq: Once | INTRAVENOUS | Status: AC
  Administered 2023-03-12: 200 mg via INTRAVENOUS
  Filled 2023-03-12: qty 200

## 2023-03-12 MED ORDER — SODIUM CHLORIDE 0.9 % IV SOLN
Freq: Once | INTRAVENOUS | Status: AC
  Filled 2023-03-12: qty 250

## 2023-03-29 ENCOUNTER — Encounter: Payer: Self-pay | Admitting: Internal Medicine

## 2023-03-29 DIAGNOSIS — D509 Iron deficiency anemia, unspecified: Secondary | ICD-10-CM

## 2023-03-29 NOTE — Telephone Encounter (Signed)
Do

## 2023-04-01 NOTE — Telephone Encounter (Signed)
Ok for referral.  Has history of iron deficient anemia.

## 2023-04-01 NOTE — Telephone Encounter (Signed)
Ok to refer her to GI?

## 2023-04-08 ENCOUNTER — Encounter: Payer: Self-pay | Admitting: Internal Medicine

## 2023-04-09 NOTE — Telephone Encounter (Signed)
I do not see the name of the medication in the chart.  May not be in yet if was just seen yesterday. To be able to answer her question, need the names of the medication. Does she have the medication.  If so, can get name from her or her pharmacy or [facility].

## 2023-04-09 NOTE — Telephone Encounter (Signed)
FYI Dr Lorin Picket- patient tested positive at walk In clinic yesterday.

## 2023-04-10 NOTE — Telephone Encounter (Signed)
Directions from [facility] Walk in:  Take the cefdinir antibiotic as directed. Take the molnupiravir as directed for COVID-19. Take promethazine dextromethorphan for cough and congestion. Use the ipratropium nasal spray for nasal symptoms. Take over-the-counter Tylenol for pain or fever. Follow-up with your primary care provider if not improving.

## 2023-04-10 NOTE — Telephone Encounter (Signed)
Ok to take these medications. Please confirm doing ok.  The DM is dextramethorophan - this is for cough.  (Not a decongestant).  Keep [place] posted.

## 2023-04-11 NOTE — Telephone Encounter (Signed)
Called patient to discuss. Says she is not feeling worse. Thinks she may be feeling a little better each day. She is going to continue her medication and keep [place] posted.

## 2023-04-11 NOTE — Telephone Encounter (Signed)
LMTCB

## 2023-04-11 NOTE — Telephone Encounter (Signed)
Patient states she is returning a call from Rita Ohara, LPN.  I read message from Dr. Dale La Feria to patient.  Patient states she feels like crap.

## 2023-05-07 ENCOUNTER — Encounter: Payer: Self-pay | Admitting: Adult Health

## 2023-05-07 ENCOUNTER — Telehealth (INDEPENDENT_AMBULATORY_CARE_PROVIDER_SITE_OTHER): Payer: BC Managed Care – PPO | Admitting: Adult Health

## 2023-05-07 DIAGNOSIS — G4733 Obstructive sleep apnea (adult) (pediatric): Secondary | ICD-10-CM | POA: Diagnosis not present

## 2023-05-07 NOTE — Patient Instructions (Signed)
Continue on CPAP at bedtime Keep up the good work Continue to work on [employer] Do not drive if sleepy Follow-up in 1 year and as needed

## 2023-05-07 NOTE — Addendum Note (Signed)
Addended by: Delrae Rend on: 05/07/2023 09:48 AM   Modules accepted: Orders

## 2023-05-07 NOTE — Progress Notes (Signed)
Virtual Visit via Video Note  I connected with Lindsey Strickland on 05/07/23 at  9:00 AM EDT by a video enabled telemedicine application and verified that I am speaking with the correct person using two identifiers.  Location: Patient: Home  Provider: Office    I discussed the limitations of evaluation and management by telemedicine and the availability of in person appointments. The patient expressed understanding and agreed to proceed.  History of Present Illness: 47 year old female followed for obstructive sleep apnea  Today's video visit is a follow-up for sleep apnea.  Patient was last seen June 2022.  Patient says she has been doing well on CPAP.  Says that she feels she benefits from CPAP with decreased daytime sleepiness.  Says that she uses her CPAP every night cannot sleep without it.  If she takes a nap she also uses her CPAP.  She is currently using a fullface mask.  CPAP download shows excellent compliance with 97% usage.  Daily average usage at 8.5 hours.  Patient is on auto CPAP 5 to 20 cm H2O.  Daily average pressure 11.4 cm H2O.  AHI is 0.2/hour.  Past Medical History:  Diagnosis Date   Atypical endometrial hyperplasia    Diabetes mellitus (HCC)    Diarrhea    Environmental allergies    Generalized anxiety disorder    GERD (gastroesophageal reflux disease)    Hypercholesterolemia    Hypertension    Insomnia    Major depressive disorder    Current Outpatient Medications on File Prior to Visit  Medication Sig Dispense Refill   albuterol (VENTOLIN HFA) 108 (90 Base) MCG/ACT inhaler Inhale 2 puffs into the lungs every 6 (six) hours as needed for wheezing. Patient request ProAir brand only. 18 g 2   amLODipine (NORVASC) 10 MG tablet Take 1/2 (one-half) tablet by mouth twice daily 90 tablet 2   Cholecalciferol (VITAMIN D3) 50 MCG (2000 UT) capsule Take 2,000 Units by mouth daily.     D-Mannose 500 MG CAPS Take 500 mg by mouth in the morning and at bedtime.     estradiol  (ESTRACE) 0.1 MG/GM vaginal cream Place vaginally.     famotidine (PEPCID) 20 MG tablet TAKE 2 TABLETS (40 MG) BY MOUTH NIGHTLY     fluticasone (FLONASE) 50 MCG/ACT nasal spray Place 1 spray into both nostrils daily. 16 g 0   glucose blood test strip Use as directed to check sugars BID. Dx E11.9 100 each 12   Lancets MISC by Does not apply route. Use 1 lancets three times a day     levonorgestrel (MIRENA) 20 MCG/DAY IUD by Intrauterine route.     loratadine (CLARITIN) 10 MG tablet Take 10 mg by mouth daily.     losartan (COZAAR) 100 MG tablet Take 1 tablet (100 mg total) by mouth daily. 90 tablet 2   metFORMIN (GLUCOPHAGE) 500 MG tablet Take 2 tablets (1,000 mg total) by mouth 2 (two) times daily. 360 tablet 1   metoprolol succinate (TOPROL-XL) 100 MG 24 hr tablet Take 0.5 tablets (50 mg total) by mouth 2 (two) times daily. Take with or immediately following a meal. 90 tablet 2   pravastatin (PRAVACHOL) 40 MG tablet Take 1 tablet (40 mg total) by mouth daily. 90 tablet 1   Probiotic Product (FEM-DOPHILUS WOMENS PO) Take 1 tablet by mouth daily.     Cranberry (RA CRANBERRY) 500 MG CAPS Take 500 mg by mouth daily.     No current facility-administered medications on file prior to  visit.      Observations/Objective: Appears well in nad .   HST 05/01/20 >> AHI 6.6, SpO2 low 88%   Assessment and Plan: Excellent control of sleep apnea with excellent control on CPAP and perceived benefit.  Continue on current setting  Plan  Patient Instructions  Continue on CPAP at bedtime Keep up the good work Continue to work on healthy weight Do not drive if sleepy Follow-up in 1 year and as needed    Follow Up Instructions: Follow-up in 1 year   I discussed the assessment and treatment plan with the patient. The patient was provided an opportunity to ask questions and all were answered. The patient agreed with the plan and demonstrated an understanding of the instructions.   The patient was advised  to call back or seek an in-person evaluation if the symptoms worsen or if the condition fails to improve as anticipated.  I provided 21 minutes of non-face-to-face time during this encounter.   Rubye Oaks, NP

## 2023-05-10 ENCOUNTER — Encounter: Payer: Self-pay | Admitting: Internal Medicine

## 2023-05-10 DIAGNOSIS — F411 Generalized anxiety disorder: Secondary | ICD-10-CM

## 2023-05-10 DIAGNOSIS — F439 Reaction to severe stress, unspecified: Secondary | ICD-10-CM

## 2023-05-14 NOTE — Telephone Encounter (Signed)
Order placed for psychiatry referral.   °

## 2023-05-22 LAB — HM DIABETES EYE EXAM

## 2023-05-29 ENCOUNTER — Encounter: Payer: Self-pay | Admitting: Internal Medicine

## 2023-05-29 ENCOUNTER — Ambulatory Visit: Payer: BC Managed Care – PPO | Admitting: Internal Medicine

## 2023-05-29 VITALS — BP 130/80 | HR 83 | Ht 68.0 in | Wt 304.0 lb

## 2023-05-29 DIAGNOSIS — F439 Reaction to severe stress, unspecified: Secondary | ICD-10-CM

## 2023-05-29 DIAGNOSIS — K219 Gastro-esophageal reflux disease without esophagitis: Secondary | ICD-10-CM

## 2023-05-29 DIAGNOSIS — E7800 Pure hypercholesterolemia, unspecified: Secondary | ICD-10-CM

## 2023-05-29 DIAGNOSIS — Z7984 Long term (current) use of oral hypoglycemic drugs: Secondary | ICD-10-CM

## 2023-05-29 DIAGNOSIS — E1165 Type 2 diabetes mellitus with hyperglycemia: Secondary | ICD-10-CM | POA: Diagnosis not present

## 2023-05-29 DIAGNOSIS — I10 Essential (primary) hypertension: Secondary | ICD-10-CM

## 2023-05-29 DIAGNOSIS — D509 Iron deficiency anemia, unspecified: Secondary | ICD-10-CM

## 2023-05-29 DIAGNOSIS — D75839 Thrombocytosis, unspecified: Secondary | ICD-10-CM

## 2023-05-29 DIAGNOSIS — C649 Malignant neoplasm of unspecified kidney, except renal pelvis: Secondary | ICD-10-CM

## 2023-05-29 MED ORDER — METFORMIN HCL 500 MG PO TABS: 1000.0000 mg | ORAL_TABLET | Freq: Two times a day (BID) | ORAL | 1 refills | Status: DC

## 2023-05-29 MED ORDER — PRAVASTATIN SODIUM 40 MG PO TABS: 40.0000 mg | ORAL_TABLET | Freq: Every day | ORAL | 1 refills | Status: DC

## 2023-05-29 NOTE — Progress Notes (Signed)
Subjective:    Patient ID: Lindsey Strickland, female    DOB: 28-May-1976, 47 y.o.   MRN: [id number]  Patient here for  Chief Complaint  Patient presents with   Medical Management of Chronic Issues    HPI Follow up appt - regarding diabetes, hypertension and hypercholesterolemia.  S/p partial right nephrectomy - for RCC. Persistent anemia. Had f/u with Dr Juliann Pares this week. No changes made. Continues on cpap.  Had f/u with pulmonary 05/07/23. Doing well with cpap.  Had mammogram 05/06/23 - seen in high risk clinic.  Declines MRI. Saw GI 05/02/23 - evaluation persistent intermittent diarrhea. Recommended colonoscopy.  Saw neurology - discussed migraines.  Recommended magnesium.  EMG - c/w CTS. Recommended alpha lipoic acid.  Diagnosed with covid 04/08/23.  Treated with molnupiravir.  Seeing Dr Donneta Romberg for f/u anemia.  Receiving intermittent venofer infusion. Increased stress related to her job/work. She has been working from home.  Work is requiring returning to work and she feels she is not able to do this with her underlying medical issues and psychiatric issues. She has discussed this with her psychiatrist and reports her psychiatrist is willing to provide documentation to support this.  Saw psychiatry yesterday.  Discussed her increased stress and anxiety/depression.  Discussed suicidal ideations. He has had previous suicidal ideations and has discussed with psychiatry.  Has a plan if she has these thoughts and is touching base with her therapist regularly.  No current SI.    Past Medical History:  Diagnosis Date   Atypical endometrial hyperplasia    Diabetes mellitus (HCC)    Diarrhea    Environmental allergies    Generalized anxiety disorder    GERD (gastroesophageal reflux disease)    Hypercholesterolemia    Hypertension    Insomnia    Major depressive disorder    Past Surgical History:  Procedure Laterality Date   COLONOSCOPY     COLONOSCOPY WITH PROPOFOL N/A 05/28/2018    Procedure: COLONOSCOPY WITH PROPOFOL;  Surgeon: Scot Jun, MD;  [facility];  Service: Endoscopy;  Laterality: N/A;   ESOPHAGOGASTRODUODENOSCOPY (EGD) WITH PROPOFOL N/A 05/28/2018   Procedure: ESOPHAGOGASTRODUODENOSCOPY (EGD) WITH PROPOFOL;  Surgeon: Scot Jun, MD;  Location[facility] ENDOSCOPY;  Service: Endoscopy;  Laterality: N/A;   HYSTEROSCOPY WITH D & C     and polyp removal   Family History  Problem Relation Age of Onset   Hypertension Mother    Diabetes Mother    Diverticulosis Mother    Depression Mother    Hypercholesterolemia Father    Hypertension Father    Cancer Father    Heart disease Maternal Grandfather    Diverticulosis Paternal Grandmother    Hyperlipidemia Paternal Grandmother    Heart failure Paternal Grandmother    Cancer Maternal Grandmother    Alcoholism Paternal Uncle    Social History   Socioeconomic History   Marital status: Single    Spouse name: Not on file   Number of children: 2   Years of education: Not on file   Highest education level: High school graduate  Occupational History    Employer: [employer]  Tobacco Use   Smoking status: Former    Current packs/day: 0.00    Types: Cigarettes    Quit date: 08/06/2000    Years since quitting: 22.8   Smokeless tobacco: Never  Vaping Use   Vaping status: Never Used  Substance and Sexual Activity   Alcohol use: Yes    Alcohol/week: 0.0 standard drinks  of alcohol    Comment: rare   Drug use: No   Sexual activity: Yes    Birth control/protection: I.U.D.  Other Topics Concern   Not on file  Social History Narrative   Not on file   Social Determinants of Health   Financial Resource Strain: Patient Declined (11/29/2022)   Overall Financial Resource Strain (CARDIA)    Difficulty of Paying Living Expenses: Patient declined  Food Insecurity: No Food Insecurity (01/21/2023)   Hunger Vital Sign    Worried About Running Out of Food in the Last Year: Never true    Ran  Out of Food in the Last Year: Never true  Transportation Needs: No Transportation Needs (01/21/2023)   PRAPARE - Administrator, Civil Service (Medical): No    Lack of Transportation (Non-Medical): No  Physical Activity: Unknown (11/29/2022)   Exercise Vital Sign    Days of Exercise per Week: Patient declined    Minutes of Exercise per Session: Not on file  Stress: Stress Concern Present (11/29/2022)   [employer] of Occupational Health - Occupational Stress Questionnaire    Feeling of Stress : Rather much  Social Connections: Unknown (11/29/2022)   Social Connection and Isolation Panel [NHANES]    Frequency of Communication with Friends and Family: Patient declined    Frequency of Social Gatherings with Friends and Family: Patient declined    Attends Religious Services: Patient declined    Database administrator or Organizations: Patient declined    Attends Engineer, structural: Not on file    Marital Status: Patient declined     Review of Systems  Constitutional:  Negative for appetite change and unexpected weight change.  HENT:  Negative for congestion and sinus pressure.   Respiratory:  Negative for cough, chest tightness and shortness of breath.   Cardiovascular:  Negative for chest pain and palpitations.  Gastrointestinal:  Negative for abdominal pain, diarrhea, nausea and vomiting.  Genitourinary:  Negative for difficulty urinating and dysuria.  Musculoskeletal:  Negative for joint swelling and myalgias.  Skin:  Negative for color change and rash.  Neurological:  Negative for dizziness and headaches.  Psychiatric/Behavioral:         Increased stress/anxiety as outlined.         Objective:     BP 130/80   Pulse 83   Ht 5\' 8"  (1.727 m)   Wt (!) 304 lb (137.9 kg)   SpO2 99%   BMI 46.22 kg/m  Wt Readings from Last 3 Encounters:  05/29/23 (!) 304 lb (137.9 kg)  03/05/23 (!) 303 lb 6.4 oz (137.6 kg)  11/29/22 297 lb (134.7 kg)    Physical  Exam Vitals reviewed.  Constitutional:      General: She is not in acute distress.    Appearance: Normal appearance.  HENT:     Head: Normocephalic and atraumatic.     Right Ear: External ear normal.     Left Ear: External ear normal.  Eyes:     General: No scleral icterus.       Right eye: No discharge.        Left eye: No discharge.     Conjunctiva/sclera: Conjunctivae normal.  Neck:     Thyroid: No thyromegaly.  Cardiovascular:     Rate and Rhythm: Normal rate and regular rhythm.  Pulmonary:     Effort: No respiratory distress.     Breath sounds: Normal breath sounds. No wheezing.  Abdominal:     General:  Bowel sounds are normal.     Palpations: Abdomen is soft.     Tenderness: There is no abdominal tenderness.  Musculoskeletal:        General: No swelling or tenderness.     Cervical back: Neck supple. No tenderness.  Lymphadenopathy:     Cervical: No cervical adenopathy.  Skin:    Findings: No erythema or rash.  Neurological:     Mental Status: She is alert.  Psychiatric:        Mood and Affect: Mood normal.        Behavior: Behavior normal.      Outpatient Encounter Medications as of 05/29/2023  Medication Sig   albuterol (VENTOLIN HFA) 108 (90 Base) MCG/ACT inhaler Inhale 2 puffs into the lungs every 6 (six) hours as needed for wheezing. Patient request ProAir brand only.   amLODipine (NORVASC) 10 MG tablet Take 1/2 (one-half) tablet by mouth twice daily   Cholecalciferol (VITAMIN D3) 50 MCG (2000 UT) capsule Take 2,000 Units by mouth daily.   Cranberry (RA CRANBERRY) 500 MG CAPS Take 500 mg by mouth daily.   D-Mannose 500 MG CAPS Take 500 mg by mouth in the morning and at bedtime.   estradiol (ESTRACE) 0.1 MG/GM vaginal cream Place vaginally.   famotidine (PEPCID) 20 MG tablet TAKE 2 TABLETS (40 MG) BY MOUTH NIGHTLY   fluticasone (FLONASE) 50 MCG/ACT nasal spray Place 1 spray into both nostrils daily.   glucose blood test strip Use as directed to check sugars  BID. Dx E11.9   Lancets MISC by Does not apply route. Use 1 lancets three times a day   levonorgestrel (MIRENA) 20 MCG/DAY IUD by Intrauterine route.   loratadine (CLARITIN) 10 MG tablet Take 10 mg by mouth daily.   losartan (COZAAR) 100 MG tablet Take 1 tablet (100 mg total) by mouth daily.   metoprolol succinate (TOPROL-XL) 100 MG 24 hr tablet Take 0.5 tablets (50 mg total) by mouth 2 (two) times daily. Take with or immediately following a meal.   Probiotic Product (FEM-DOPHILUS WOMENS PO) Take 1 tablet by mouth daily.   [DISCONTINUED] metFORMIN (GLUCOPHAGE) 500 MG tablet Take 2 tablets (1,000 mg total) by mouth 2 (two) times daily.   [DISCONTINUED] pravastatin (PRAVACHOL) 40 MG tablet Take 1 tablet (40 mg total) by mouth daily.   metFORMIN (GLUCOPHAGE) 500 MG tablet Take 2 tablets (1,000 mg total) by mouth 2 (two) times daily.   pravastatin (PRAVACHOL) 40 MG tablet Take 1 tablet (40 mg total) by mouth daily.   No facility-administered encounter medications on file as of 05/29/2023.     Lab Results  Component Value Date   WBC 10.8 (H) 03/05/2023   HGB 11.1 (L) 03/05/2023   HCT 35.2 (L) 03/05/2023   PLT 406 (H) 03/05/2023   GLUCOSE 128 (H) 03/05/2023   CHOL 183 03/05/2023   TRIG 261 (H) 03/05/2023   HDL 43 03/05/2023   LDLDIRECT 132.0 11/02/2020   LDLCALC 88 03/05/2023   ALT 14 11/29/2022   AST 12 11/29/2022   NA 138 03/05/2023   K 4.5 03/05/2023   CL 103 03/05/2023   CREATININE 0.61 03/05/2023   BUN 17 03/05/2023   CO2 24 03/05/2023   TSH 5.06 07/24/2022   INR 0.93 05/05/2018   HGBA1C 7.5 (H) 03/05/2023   MICROALBUR 13.4 (H) 07/24/2022    CT CARDIAC SCORING (SELF PAY ONLY)  Addendum Date: 02/01/2023   ADDENDUM REPORT: 02/01/2023 16:28 EXAM: OVER-READ INTERPRETATION  CT CHEST The following report is an  over-read performed by radiologist Dr. Narda Rutherford of [facility] on 02/01/2023. This over-read does not include interpretation of cardiac or coronary  anatomy or pathology. The coronary calcium score interpretation by the cardiologist is attached. COMPARISON:  None. FINDINGS: Vascular: No aortic atherosclerosis. The included aorta is normal in caliber. Mediastinum/nodes: No adenopathy or mass.  Minimal hiatal hernia. Lungs: No focal airspace disease. No pulmonary nodule. No pleural fluid. Scattered thin walled pulmonary cysts. The included airways are patent. Upper abdomen: No acute findings.  Hepatic steatosis. Musculoskeletal: There are no acute or suspicious osseous abnormalities. IMPRESSION: 1. Minimal hiatal hernia. 2. Hepatic steatosis. Electronically Signed   By: Narda Rutherford M.D.   On: 02/01/2023 16:28   Result Date: 02/01/2023 CLINICAL DATA:  Risk stratification EXAM: Coronary Calcium Score TECHNIQUE: The patient was scanned on a Siemens Somatom scanner. Axial non-contrast 3 mm slices were carried out through the heart. The data set was analyzed on a dedicated work station and scored using the Agatson method. FINDINGS: Non-cardiac: See separate report from [facility]. Ascending Aorta: Normal size Pericardium: Normal Coronary arteries: Normal origin of left and right coronary arteries. Distribution of arterial calcifications if present, as noted below; LM 0 LAD 0 LCx 0 RCA 0 Total 0 IMPRESSION AND RECOMMENDATION: 1. Coronary calcium score of 0. 2. CAC 0, CAC-DRS A0. 3. Continue heart healthy lifestyle and risk factor modification. Electronically Signed: By: Debbe Odea M.D. On: 01/28/2023 16:46       Assessment & Plan:  Type 2 diabetes mellitus with hyperglycemia, without long-term current use of insulin (HCC) Assessment & Plan: Last A1c 7.5. Have discussed diet and exercise.  Have previous discussed importance of getting sugars under control.  Have discussed medications.  Has desired not to add medications.  Follow met b and A1c.     Gastroesophageal reflux disease without esophagitis Assessment & Plan: On prilosec.   Continue.     Hypercholesterolemia Assessment & Plan: Continue pravastatin.  Diet and exercise.  Had previously discussed changing to crestor.  Had wanted to hold on change.  Check lipid panel with next labs. Follow lipid panel and liver function tests.    Primary hypertension Assessment & Plan: Continue losartan, metoprolol and amlodipine.  Blood pressures as outlined.  Elevated above goal.  Has desired not to add medication. Follow pressures.  Follow metabolic panel.     Iron deficiency anemia, unspecified iron deficiency anemia type Assessment & Plan:  Seeing Dr Donneta Romberg for f/u anemia.  Receiving intermittent venofer infusion.    Renal cell carcinoma, unspecified laterality [facility]) Assessment & Plan: S/p recent partial nephrectomy as outlined.  Continues f/u with urology.     Stress Assessment & Plan: Increased stress as outlined.  Work stress.  Has been working from home.  Concern regarding returning back to work.  She has discussed with her psychiatrist regarding her concern and her psychiatrist has discussed supplying information to her work.     Thrombocytosis (HCC) Assessment & Plan: Has been evaluated by hematology.  Felt to be reactive.  Hematology following cbc.  Receiving iron infusions.    Other orders -     metFORMIN HCl; Take 2 tablets (1,000 mg total) by mouth 2 (two) times daily.  Dispense: 360 tablet; Refill: 1 -     Pravastatin Sodium; Take 1 tablet (40 mg total) by mouth daily.  Dispense: 90 tablet; Refill: 1     [place], MD

## 2023-05-29 NOTE — Telephone Encounter (Signed)
Need copy of form.

## 2023-05-30 NOTE — Telephone Encounter (Signed)
Forms printed for review.

## 2023-05-30 NOTE — Telephone Encounter (Signed)
Please call and notify her that I will review the form.  What we had discussed were accommodations for her work - working from home instead of going in to work.

## 2023-05-31 ENCOUNTER — Encounter: Payer: Self-pay | Admitting: Internal Medicine

## 2023-06-01 ENCOUNTER — Encounter: Payer: Self-pay | Admitting: Internal Medicine

## 2023-06-01 DIAGNOSIS — R9389 Abnormal findings on diagnostic imaging of other specified body structures: Secondary | ICD-10-CM

## 2023-06-02 ENCOUNTER — Encounter: Payer: Self-pay | Admitting: Internal Medicine

## 2023-06-02 NOTE — Assessment & Plan Note (Signed)
Has been evaluated by hematology.  Felt to be reactive.  Hematology following cbc.  Receiving iron infusions.

## 2023-06-02 NOTE — Assessment & Plan Note (Signed)
Last A1c 7.5. Have discussed diet and exercise.  Have previous discussed importance of getting sugars under control.  Have discussed medications.  Has desired not to add medications.  Follow met b and A1c.

## 2023-06-02 NOTE — Assessment & Plan Note (Signed)
Increased stress as outlined.  Work stress.  Has been working from home.  Concern regarding returning back to work.  She has discussed with her psychiatrist regarding her concern and her psychiatrist has discussed supplying information to her work.

## 2023-06-02 NOTE — Assessment & Plan Note (Signed)
On prilosec.  Continue.

## 2023-06-02 NOTE — Assessment & Plan Note (Signed)
Continue pravastatin.  Diet and exercise.  Had previously discussed changing to crestor.  Had wanted to hold on change.  Check lipid panel with next labs. Follow lipid panel and liver function tests.

## 2023-06-02 NOTE — Assessment & Plan Note (Signed)
Continue losartan, metoprolol and amlodipine.  Blood pressures as outlined.  Elevated above goal.  Has desired not to add medication. Follow pressures.  Follow metabolic panel.

## 2023-06-02 NOTE — Assessment & Plan Note (Signed)
S/p recent partial nephrectomy as outlined.  Continues f/u with urology.

## 2023-06-02 NOTE — Assessment & Plan Note (Signed)
Seeing Dr Donneta Romberg for f/u anemia.  Receiving intermittent venofer infusion.

## 2023-06-02 NOTE — Assessment & Plan Note (Signed)
Low carb diet and exercise.  On metformin.  Have discussed other treatment options as outlined.  Has declined to start.  Follow met b and a1c. Once we get above issues with her work/stress, etc - then will work on her diabetes and diet/exercise.

## 2023-06-03 MED ORDER — GLUCOSE BLOOD VI STRP: ORAL_STRIP | 12 refills | Status: AC

## 2023-06-03 NOTE — Telephone Encounter (Signed)
Notify Lindsey Strickland that given the finding in the lungs, I would like to refer her to pulmonary for further evaluation.

## 2023-06-05 NOTE — Telephone Encounter (Signed)
Yes she can take DM (robitussin DM) - not D.  Ok to refer to pulmonary Dr Meredeth Ide.  Keep [place] posted about the surgery

## 2023-06-06 NOTE — Telephone Encounter (Signed)
Pulmonary referral placed.

## 2023-06-11 ENCOUNTER — Inpatient Hospital Stay: Payer: BC Managed Care – PPO

## 2023-06-11 ENCOUNTER — Inpatient Hospital Stay: Payer: BC Managed Care – PPO | Admitting: Nurse Practitioner

## 2023-06-12 ENCOUNTER — Ambulatory Visit: Payer: BC Managed Care – PPO

## 2023-06-12 ENCOUNTER — Other Ambulatory Visit: Payer: Self-pay | Admitting: Internal Medicine

## 2023-06-12 ENCOUNTER — Other Ambulatory Visit: Payer: BC Managed Care – PPO

## 2023-06-12 ENCOUNTER — Ambulatory Visit: Payer: BC Managed Care – PPO | Admitting: Internal Medicine

## 2023-06-12 MED ORDER — FLUTICASONE PROPIONATE 50 MCG/ACT NA SUSP: 1.0000 | Freq: Every day | NASAL | 1 refills | Status: AC

## 2023-06-18 ENCOUNTER — Inpatient Hospital Stay (HOSPITAL_BASED_OUTPATIENT_CLINIC_OR_DEPARTMENT_OTHER): Payer: BC Managed Care – PPO | Admitting: Nurse Practitioner

## 2023-06-18 ENCOUNTER — Encounter: Payer: Self-pay | Admitting: Nurse Practitioner

## 2023-06-18 ENCOUNTER — Inpatient Hospital Stay: Payer: BC Managed Care – PPO

## 2023-06-18 ENCOUNTER — Inpatient Hospital Stay: Payer: BC Managed Care – PPO | Attending: Internal Medicine

## 2023-06-18 VITALS — BP 160/70 | HR 89

## 2023-06-18 VITALS — BP 157/78 | HR 87 | Temp 99.1°F | Wt 305.0 lb

## 2023-06-18 DIAGNOSIS — I10 Essential (primary) hypertension: Secondary | ICD-10-CM | POA: Insufficient documentation

## 2023-06-18 DIAGNOSIS — N920 Excessive and frequent menstruation with regular cycle: Secondary | ICD-10-CM | POA: Diagnosis not present

## 2023-06-18 DIAGNOSIS — Z87440 Personal history of urinary (tract) infections: Secondary | ICD-10-CM | POA: Insufficient documentation

## 2023-06-18 DIAGNOSIS — D500 Iron deficiency anemia secondary to blood loss (chronic): Secondary | ICD-10-CM | POA: Diagnosis not present

## 2023-06-18 DIAGNOSIS — D649 Anemia, unspecified: Secondary | ICD-10-CM

## 2023-06-18 DIAGNOSIS — D509 Iron deficiency anemia, unspecified: Secondary | ICD-10-CM

## 2023-06-18 DIAGNOSIS — E559 Vitamin D deficiency, unspecified: Secondary | ICD-10-CM | POA: Diagnosis not present

## 2023-06-18 LAB — CBC WITH DIFFERENTIAL (CANCER CENTER ONLY)
Abs Immature Granulocytes: 0.08 10*3/uL — ABNORMAL HIGH (ref 0.00–0.07)
Basophils Absolute: 0 10*3/uL (ref 0.0–0.1)
Basophils Relative: 0 %
Eosinophils Absolute: 0.2 10*3/uL (ref 0.0–0.5)
Eosinophils Relative: 2 %
HCT: 36.5 % (ref 36.0–46.0)
Hemoglobin: 11.7 g/dL — ABNORMAL LOW (ref 12.0–15.0)
Immature Granulocytes: 1 %
Lymphocytes Relative: 25 %
Lymphs Abs: 3.1 10*3/uL (ref 0.7–4.0)
MCH: 28.1 pg (ref 26.0–34.0)
MCHC: 32.1 g/dL (ref 30.0–36.0)
MCV: 87.7 fL (ref 80.0–100.0)
Monocytes Absolute: 0.7 10*3/uL (ref 0.1–1.0)
Monocytes Relative: 6 %
Neutro Abs: 8 10*3/uL — ABNORMAL HIGH (ref 1.7–7.7)
Neutrophils Relative %: 66 %
Platelet Count: 395 10*3/uL (ref 150–400)
RBC: 4.16 MIL/uL (ref 3.87–5.11)
RDW: 13.9 % (ref 11.5–15.5)
WBC Count: 12 10*3/uL — ABNORMAL HIGH (ref 4.0–10.5)
nRBC: 0 % (ref 0.0–0.2)

## 2023-06-18 LAB — IRON AND TIBC
Iron: 60 ug/dL (ref 28–170)
Saturation Ratios: 14 % (ref 10.4–31.8)
TIBC: 419 ug/dL (ref 250–450)
UIBC: 359 ug/dL

## 2023-06-18 LAB — BASIC METABOLIC PANEL - CANCER CENTER ONLY
Anion gap: 14 (ref 5–15)
BUN: 12 mg/dL (ref 6–20)
CO2: 24 mmol/L (ref 22–32)
Calcium: 9.4 mg/dL (ref 8.9–10.3)
Chloride: 99 mmol/L (ref 98–111)
Creatinine: 0.7 mg/dL (ref 0.44–1.00)
GFR, Estimated: >60 mL/min (ref >60)
Glucose, Bld: 129 mg/dL — ABNORMAL HIGH (ref 70–99)
Potassium: 4.5 mmol/L (ref 3.5–5.1)
Sodium: 137 mmol/L (ref 135–145)

## 2023-06-18 LAB — FERRITIN: Ferritin: 28 ng/mL (ref 11–307)

## 2023-06-18 MED ORDER — IRON SUCROSE 20 MG/ML IV SOLN
200.0000 mg | Freq: Once | INTRAVENOUS | Status: AC
  Administered 2023-06-18: 200 mg via INTRAVENOUS
  Filled 2023-06-18: qty 10

## 2023-06-18 NOTE — Patient Instructions (Signed)
Iron Sucrose Injection What is this medication? IRON SUCROSE (EYE ern SOO krose) treats low levels of iron (iron deficiency anemia) in people with kidney disease. Iron is a mineral that plays an important role in making red blood cells, which carry oxygen from your lungs to the rest of your body. This medicine may be used for other purposes; ask your health care provider or pharmacist if you have questions. COMMON BRAND NAME(S): Venofer What should I tell my care team before I take this medication? They need to know if you have any of these conditions: Anemia not caused by low iron levels Heart disease High levels of iron in the blood Kidney disease Liver disease An unusual or allergic reaction to iron, other medications, foods, dyes, or preservatives Pregnant or trying to get pregnant Breastfeeding How should I use this medication? This medication is for infusion into a vein. It is given in a hospital or clinic setting. Talk to your care team about the use of this medication in children. While this medication may be prescribed for children as young as 2 years for selected conditions, precautions do apply. Overdosage: If you think you have taken too much of this medicine contact a poison control center or emergency room at once. NOTE: This medicine is only for you. Do not share this medicine with others. What if I miss a dose? Keep appointments for follow-up doses. It is important not to miss your dose. Call your care team if you are unable to keep an appointment. What may interact with this medication? Do not take this medication with any of the following: Deferoxamine Dimercaprol Other iron products This medication may also interact with the following: Chloramphenicol Deferasirox This list may not describe all possible interactions. Give your health care provider a list of all the medicines, herbs, non-prescription drugs, or dietary supplements you use. Also tell them if you smoke,  drink alcohol, or use illegal drugs. Some items may interact with your medicine. What should I watch for while using this medication? Visit your care team regularly. Tell your care team if your symptoms do not start to get better or if they get worse. You may need blood work done while you are taking this medication. You may need to follow a special diet. Talk to your care team. Foods that contain iron include: whole grains/cereals, dried fruits, beans, or peas, leafy green vegetables, and organ meats (liver, kidney). What side effects may I notice from receiving this medication? Side effects that you should report to your care team as soon as possible: Allergic reactions--skin rash, itching, hives, swelling of the face, lips, tongue, or throat Low blood pressure--dizziness, feeling faint or lightheaded, blurry vision Shortness of breath Side effects that usually do not require medical attention (report to your care team if they continue or are bothersome): Flushing Headache Joint pain Muscle pain Nausea Pain, redness, or irritation at injection site This list may not describe all possible side effects. Call your doctor for medical advice about side effects. You may report side effects to FDA at 1-800-FDA-1088. Where should I keep my medication? This medication is given in a hospital or clinic. It will not be stored at home. NOTE: This sheet is a summary. It may not cover all possible information. If you have questions about this medicine, talk to your doctor, pharmacist, or health care provider.  2024 Elsevier/Gold Standard (2022-12-28 00:00:00)

## 2023-06-18 NOTE — Progress Notes (Unsigned)
[facility] CONSULT NOTE  Patient Care Team: Dale Live Oak, MD as PCP - General (Internal Medicine) Earna Coder, MD as Consulting Physician (Oncology)  CHIEF COMPLAINTS/PURPOSE OF CONSULTATION: ANEMIA  HEMATOLOGY HISTORY  # ANEMIA [Hb; MCV-platelets- WBC; Iron sat; ferritin;  GFR- CT/US- ;  EGD/colonoscopy-   Latest Reference Range & Units 11/29/22 13:57  Iron 42 - 145 ug/dL 38 (L)  TIBC 956.2 - 130.8 mcg/dL 657.8 (H)  Saturation Ratios 20.0 - 50.0 % 8.4 (L)  Ferritin 10.0 - 291.0 ng/mL 8.5 (L)  Transferrin 212.0 - 360.0 mg/dL 469.6    HISTORY OF PRESENTING ILLNESS: Patient ambulating independently.   Lindsey Strickland 47 y.o. female pleasant patient with above history of kidney cancer and iron deficiency d/t menorrhagia who returns to clinic for follow up. She continues to be followed by [facility] urology for surveillance of RCC with recent visit. She reports some palpitations recently and leg cramping. Continues to have heavy menses. Denies any neurologic complaints. Denies recent fevers or illnesses. Denies any easy bleeding or bruising. No melena or hematochezia. No pica or restless leg. Reports good appetite and denies weight loss. Denies chest pain. Denies any nausea, vomiting, constipation, or diarrhea. Denies urinary complaints. Patient offers no further specific complaints today.    Review of Systems  Constitutional:  Positive for malaise/fatigue. Negative for chills, diaphoresis, fever and weight loss.  HENT:  Negative for nosebleeds and sore throat.   Eyes:  Negative for double vision.  Respiratory:  Negative for cough, hemoptysis, sputum production, shortness of breath and wheezing.   Cardiovascular:  Positive for palpitations. Negative for chest pain, orthopnea and leg swelling.  Gastrointestinal:  Negative for abdominal pain, blood in stool, constipation, diarrhea, heartburn, melena, nausea and vomiting.  Genitourinary:  Negative for dysuria,  frequency and urgency.  Musculoskeletal:  Negative for back pain and joint pain.  Skin: Negative.  Negative for itching and rash.  Neurological:  Positive for dizziness. Negative for tingling, focal weakness, weakness and headaches.  Endo/Heme/Allergies:  Does not bruise/bleed easily.  Psychiatric/Behavioral:  Negative for depression. The patient is not nervous/anxious and does not have insomnia.     MEDICAL HISTORY:  Past Medical History:  Diagnosis Date   Atypical endometrial hyperplasia    Diabetes mellitus (HCC)    Diarrhea    Environmental allergies    Generalized anxiety disorder    GERD (gastroesophageal reflux disease)    Hypercholesterolemia    Hypertension    Insomnia    Major depressive disorder     SURGICAL HISTORY: Past Surgical History:  Procedure Laterality Date   COLONOSCOPY     COLONOSCOPY WITH PROPOFOL N/A 05/28/2018   Procedure: COLONOSCOPY WITH PROPOFOL;  Surgeon: Scot Jun, MD;  Location: [facility] ENDOSCOPY;  Service: Endoscopy;  Laterality: N/A;   ESOPHAGOGASTRODUODENOSCOPY (EGD) WITH PROPOFOL N/A 05/28/2018   Procedure: ESOPHAGOGASTRODUODENOSCOPY (EGD) WITH PROPOFOL;  Surgeon: Scot Jun, MD;  Location: [facility] ENDOSCOPY;  Service: Endoscopy;  Laterality: N/A;   HYSTEROSCOPY WITH D & C     and polyp removal    SOCIAL HISTORY: Social History   Socioeconomic History   Marital status: Single    Spouse name: Not on file   Number of children: 2   Years of education: Not on file   Highest education level: High school graduate  Occupational History    Employer: [employer]  Tobacco Use   Smoking status: Former    Current packs/day: 0.00    Types: Cigarettes  Quit date: 08/06/2000    Years since quitting: 22.8   Smokeless tobacco: Never  Vaping Use   Vaping status: Never Used  Substance and Sexual Activity   Alcohol use: Yes    Alcohol/week: 0.0 standard drinks of alcohol    Comment: rare   Drug use: No   Sexual activity: Yes     Birth control/protection: I.U.D.  Other Topics Concern   Not on file  Social History Narrative   Not on file   Social Determinants of Health   Financial Resource Strain: Patient Declined (11/29/2022)   Overall Financial Resource Strain (CARDIA)    Difficulty of Paying Living Expenses: Patient declined  Food Insecurity: No Food Insecurity (01/21/2023)   Hunger Vital Sign    Worried About Running Out of Food in the Last Year: Never true    Ran Out of Food in the Last Year: Never true  Transportation Needs: No Transportation Needs (01/21/2023)   PRAPARE - Administrator, Civil Service (Medical): No    Lack of Transportation (Non-Medical): No  Physical Activity: Unknown (11/29/2022)   Exercise Vital Sign    Days of Exercise per Week: Patient declined    Minutes of Exercise per Session: Not on file  Stress: Stress Concern Present (11/29/2022)   [employer] of Occupational Health - Occupational Stress Questionnaire    Feeling of Stress : Rather much  Social Connections: Unknown (11/29/2022)   Social Connection and Isolation Panel [NHANES]    Frequency of Communication with Friends and Family: Patient declined    Frequency of Social Gatherings with Friends and Family: Patient declined    Attends Religious Services: Patient declined    Database administrator or Organizations: Patient declined    Attends Banker Meetings: Not on file    Marital Status: Patient declined  Intimate Partner Violence: Not At Risk (01/21/2023)   Humiliation, Afraid, Rape, and Kick questionnaire    Fear of Current or Ex-Partner: No    Emotionally Abused: No    Physically Abused: No    Sexually Abused: No    FAMILY HISTORY: Family History  Problem Relation Age of Onset   Hypertension Mother    Diabetes Mother    Diverticulosis Mother    Depression Mother    Hypercholesterolemia Father    Hypertension Father    Cancer Father    Heart disease Maternal Grandfather     Diverticulosis Paternal Grandmother    Hyperlipidemia Paternal Grandmother    Heart failure Paternal Grandmother    Cancer Maternal Grandmother    Alcoholism Paternal Uncle     ALLERGIES:  is allergic to tetracycline, augmentin [amoxicillin-pot clavulanate], hctz [hydrochlorothiazide], lisinopril, other, prednisone, and ciprofloxacin.  MEDICATIONS:  Current Outpatient Medications  Medication Sig Dispense Refill   albuterol (VENTOLIN HFA) 108 (90 Base) MCG/ACT inhaler Inhale 2 puffs into the lungs every 6 (six) hours as needed for wheezing. Patient request ProAir brand only. 18 g 2   amLODipine (NORVASC) 10 MG tablet Take 1/2 (one-half) tablet by mouth twice daily 90 tablet 2   Cholecalciferol (VITAMIN D3) 50 MCG (2000 UT) capsule Take 2,000 Units by mouth daily.     Cranberry (RA CRANBERRY) 500 MG CAPS Take 500 mg by mouth daily.     D-Mannose 500 MG CAPS Take 500 mg by mouth in the morning and at bedtime.     estradiol (ESTRACE) 0.1 MG/GM vaginal cream Place vaginally.     famotidine (PEPCID) 20 MG tablet TAKE 2 TABLETS (40  MG) BY MOUTH NIGHTLY     fluticasone (FLONASE) 50 MCG/ACT nasal spray Place 1 spray into both nostrils daily. 48 g 1   glucose blood test strip Use as directed to check sugars BID. Dx E11.9 100 each 12   Lancets MISC by Does not apply route. Use 1 lancets three times a day     levonorgestrel (MIRENA) 20 MCG/DAY IUD by Intrauterine route.     loratadine (CLARITIN) 10 MG tablet Take 10 mg by mouth daily.     losartan (COZAAR) 100 MG tablet Take 1 tablet (100 mg total) by mouth daily. 90 tablet 2   metFORMIN (GLUCOPHAGE) 500 MG tablet Take 2 tablets (1,000 mg total) by mouth 2 (two) times daily. 360 tablet 1   metoprolol succinate (TOPROL-XL) 100 MG 24 hr tablet Take 0.5 tablets (50 mg total) by mouth 2 (two) times daily. Take with or immediately following a meal. 90 tablet 2   pravastatin (PRAVACHOL) 40 MG tablet Take 1 tablet (40 mg total) by mouth daily. 90 tablet 1    Probiotic Product (FEM-DOPHILUS WOMENS PO) Take 1 tablet by mouth daily.     No current facility-administered medications for this visit.     PHYSICAL EXAMINATION: Vitals:   06/18/23 1547  BP: (!) 157/78  Pulse: 87  Temp: 99.1 F (37.3 C)  SpO2: 100%   Filed Weights   06/18/23 1547  Weight: (!) 305 lb (138.3 kg)   Physical Exam Vitals reviewed.  HENT:     Head: Normocephalic and atraumatic.  Cardiovascular:     Rate and Rhythm: Normal rate and regular rhythm.  Pulmonary:     Comments: Decreased breath sounds bilaterally.  Abdominal:     General: There is no distension.  Musculoskeletal:        General: No deformity.  Skin:    General: Skin is warm.     Coloration: Skin is not pale.  Neurological:     Mental Status: She is alert and oriented to person, place, and time.  Psychiatric:        Mood and Affect: Mood normal.        Behavior: Behavior normal.      LABORATORY DATA:  I have reviewed the data as listed Lab Results  Component Value Date   WBC 12.0 (H) 06/18/2023   HGB 11.7 (L) 06/18/2023   HCT 36.5 06/18/2023   MCV 87.7 06/18/2023   PLT 395 06/18/2023   Recent Labs    07/24/22 0817 11/29/22 1357 03/05/23 1500 06/18/23 1538  NA 136 138 138 137  K 4.2 4.8 4.5 4.5  CL 100 101 103 99  CO2 26 24 24 24   GLUCOSE 167* 110* 128* 129*  BUN 13 17 17 12   CREATININE 0.49 0.73 0.61 0.70  CALCIUM 9.2 9.7 9.7 9.4  GFRNONAA  --   --  >60 >60  PROT 6.9 7.2  --   --   ALBUMIN 4.2 4.3  --   --   AST 17 12  --   --   ALT 22 14  --   --   ALKPHOS 61 69  --   --   BILITOT 0.3 0.3  --   --   BILIDIR 0.0 0.1  --   --   Iron/TIBC/Ferritin/ %Sat    Component Value Date/Time   IRON 60 06/18/2023 1538   TIBC 419 06/18/2023 1538   FERRITIN 28 06/18/2023 1538   IRONPCTSAT 14 06/18/2023 1538      No  results found.  ASSESSMENT & PLAN:   # Anemia- Hb- 10.5; Ferritin- 8.5 [PCP- APRIL 2024] symptomatic.  Likely due to iron deficiency -menorrhagia.  Poor  tolerance/lack of improvement on oral iron.  Hmg 11.1. Received venofer 200 mg x 4. Tolerated well. Hmg 11.7 today. Iron studies pending. Ferritin 28. Improved. Iron sat 14%. Improved. Recommend venofer 200 mg x 3. Hold urine pregnancy d/t IUD/partner vasectomy.   #Etiology of iron deficiency: s/p IUD. Managed by [facility], Amy Beverely Pace. Hx of Hysteroscopy, D&C, polypectomy in 2017. Colo & EGD 2019- internal bleed [[facility]]. Planned repeat 2025.   # Hx of right RCC - incidental w/ Dr Celso Sickle at [facility]. S/p partial nephrectomy. Imaging 06/01/23; report reviewed, at [facility] was negative for carcinoma. Follow up with nephrology as recommended.   # Kidney Stone- left sided staghorn stone. Thought to be contributing to UTI. Per patient, plans for PCNL at [facility].   # Recurrent UTI- [urology]. Managed by [facility]  # Chronic intermitent diarrhea 1-2 a day- ? Iron- off; recommend GI evaluation- [facility].  # vit D def- [Dr.Shah/PCP]-  # HTN-systolic 160s to 161W. Likely anxiety. 157/78 today. Continue home monitoring.   DISPOSITION: Venofer today then 2 additional doses over next month 3 mo- lab (cbc, ferritin, iron studies, cmp), Dr Donneta Romberg, +/- venofer- la  No problem-specific Assessment & Plan notes found for this encounter.  All questions were answered. The patient knows to call the clinic with any problems, questions or concerns.  Alinda Dooms, NP 06/18/2023

## 2023-06-19 ENCOUNTER — Telehealth: Payer: Self-pay | Admitting: Nurse Practitioner

## 2023-06-19 NOTE — Telephone Encounter (Signed)
Pt called and stated she was supposed to get 4 more iron infusions. Her check out note only mentioned a 3 month follow up. Please advise

## 2023-06-20 ENCOUNTER — Encounter: Payer: Self-pay | Admitting: Internal Medicine

## 2023-06-25 ENCOUNTER — Inpatient Hospital Stay: Payer: BC Managed Care – PPO

## 2023-07-02 ENCOUNTER — Inpatient Hospital Stay: Payer: BC Managed Care – PPO

## 2023-07-09 ENCOUNTER — Inpatient Hospital Stay: Payer: BC Managed Care – PPO | Attending: Internal Medicine

## 2023-07-09 ENCOUNTER — Encounter: Payer: Self-pay | Admitting: Gastroenterology

## 2023-07-09 VITALS — BP 144/76 | HR 97 | Temp 99.5°F | Resp 18

## 2023-07-09 DIAGNOSIS — D509 Iron deficiency anemia, unspecified: Secondary | ICD-10-CM | POA: Insufficient documentation

## 2023-07-09 MED ORDER — IRON SUCROSE 20 MG/ML IV SOLN
200.0000 mg | Freq: Once | INTRAVENOUS | Status: AC
  Administered 2023-07-09: 200 mg via INTRAVENOUS
  Filled 2023-07-09: qty 10

## 2023-07-09 MED ORDER — SODIUM CHLORIDE 0.9% FLUSH
10.0000 mL | Freq: Once | INTRAVENOUS | Status: AC | PRN
  Administered 2023-07-09: 10 mL
  Filled 2023-07-09: qty 10

## 2023-07-10 ENCOUNTER — Encounter: Payer: Self-pay | Admitting: Internal Medicine

## 2023-07-10 ENCOUNTER — Inpatient Hospital Stay: Payer: BC Managed Care – PPO

## 2023-07-16 ENCOUNTER — Other Ambulatory Visit: Payer: Self-pay

## 2023-07-16 ENCOUNTER — Other Ambulatory Visit: Payer: BC Managed Care – PPO

## 2023-07-16 DIAGNOSIS — E7800 Pure hypercholesterolemia, unspecified: Secondary | ICD-10-CM

## 2023-07-16 DIAGNOSIS — E1165 Type 2 diabetes mellitus with hyperglycemia: Secondary | ICD-10-CM

## 2023-07-17 ENCOUNTER — Inpatient Hospital Stay: Payer: BC Managed Care – PPO

## 2023-07-25 ENCOUNTER — Ambulatory Visit (INDEPENDENT_AMBULATORY_CARE_PROVIDER_SITE_OTHER): Payer: BC Managed Care – PPO | Admitting: Internal Medicine

## 2023-07-25 VITALS — BP 128/76 | HR 88 | Temp 98.0°F | Resp 16 | Ht 68.0 in | Wt 307.0 lb

## 2023-07-25 DIAGNOSIS — D509 Iron deficiency anemia, unspecified: Secondary | ICD-10-CM

## 2023-07-25 DIAGNOSIS — I10 Essential (primary) hypertension: Secondary | ICD-10-CM

## 2023-07-25 DIAGNOSIS — E1165 Type 2 diabetes mellitus with hyperglycemia: Secondary | ICD-10-CM | POA: Diagnosis not present

## 2023-07-25 DIAGNOSIS — F439 Reaction to severe stress, unspecified: Secondary | ICD-10-CM | POA: Diagnosis not present

## 2023-07-25 DIAGNOSIS — E7800 Pure hypercholesterolemia, unspecified: Secondary | ICD-10-CM | POA: Diagnosis not present

## 2023-07-25 DIAGNOSIS — D75839 Thrombocytosis, unspecified: Secondary | ICD-10-CM | POA: Diagnosis not present

## 2023-07-25 DIAGNOSIS — C649 Malignant neoplasm of unspecified kidney, except renal pelvis: Secondary | ICD-10-CM

## 2023-07-25 DIAGNOSIS — K219 Gastro-esophageal reflux disease without esophagitis: Secondary | ICD-10-CM

## 2023-07-25 LAB — HEPATIC FUNCTION PANEL
ALT: 15 U/L (ref 0–35)
AST: 11 U/L (ref 0–37)
Albumin: 4.2 g/dL (ref 3.5–5.2)
Alkaline Phosphatase: 68 U/L (ref 39–117)
Bilirubin, Direct: 0 mg/dL (ref 0.0–0.3)
Total Bilirubin: 0.3 mg/dL (ref 0.2–1.2)
Total Protein: 7 g/dL (ref 6.0–8.3)

## 2023-07-25 LAB — HM DIABETES FOOT EXAM

## 2023-07-25 LAB — LIPID PANEL
Cholesterol: 177 mg/dL (ref 0–200)
HDL: 43 mg/dL (ref >39.00)
LDL Cholesterol: 96 mg/dL (ref 0–99)
NonHDL: 133.72
Total CHOL/HDL Ratio: 4
Triglycerides: 189 mg/dL — ABNORMAL HIGH (ref 0.0–149.0)
VLDL: 37.8 mg/dL (ref 0.0–40.0)

## 2023-07-25 LAB — MICROALBUMIN / CREATININE URINE RATIO
Creatinine,U: 113.3 mg/dL
Microalb Creat Ratio: 39.7 mg/g — ABNORMAL HIGH (ref 0.0–30.0)
Microalb, Ur: 45 mg/dL — ABNORMAL HIGH (ref 0.0–1.9)

## 2023-07-25 LAB — HEMOGLOBIN A1C: Hgb A1c MFr Bld: 8.1 % — ABNORMAL HIGH (ref 4.6–6.5)

## 2023-07-25 LAB — TSH: TSH: 3.55 u[IU]/mL (ref 0.35–5.50)

## 2023-07-25 NOTE — Progress Notes (Signed)
Subjective:    Patient ID: Lindsey Strickland, female    DOB: May 17, 1976, 47 y.o.   MRN: [id number]  Patient here for  Chief Complaint  Patient presents with   Medical Management of Chronic Issues    HPI Here for a scheduled follow up - follow up appt regarding diabetes, hypertension and hypercholesterolemia. S/p partial right nephrectomy - for RCC. Had f/u 06/04/23 - stable. Recommended f/u in 12 months for cxr, CT scan of the abdomen and labs. Also discussed left sided staghorn stone - recommended stone provider and discussed stone prevention. Continues on cpap. Saw neurology - discussed migraines. Recommended magnesium. EMG - c/w CTS. Recommended alpha lipoic acid. Seeing Dr Donneta Romberg for f/u anemia. Receiving intermittent venofer infusion. Due for injection tomorrow. Colonoscopy planned for 08/12/23. Scheduled to see Dr Meredeth Ide tomorrow - further evaluation - thin wall cyst - noted on cardiac CT scan. Breathing overall appears to be stable. Seeing psychiatry. Planning to start new medication. Having persistent diarrhea/loose stool. Has been seeing GI. Has noticed is better off metformin. Discussed other treatment options for her diabetes. She does not want to make any changes today.    Past Medical History:  Diagnosis Date   Abnormal bleeding in menstrual cycle    Anemia    Atypical endometrial hyperplasia    Cancer (HCC)    Clear cell carcinoma of kidney (HCC)    Diabetes mellitus (HCC)    Diarrhea    Environmental allergies    GAD (generalized anxiety disorder)    Generalized anxiety disorder    GERD (gastroesophageal reflux disease)    Hypercholesterolemia    Hypertension    Insomnia    Leukocytosis    Major depressive disorder    Palpitations    Symptomatic anemia    Thrombocytosis    Vitamin D deficiency    Past Surgical History:  Procedure Laterality Date   COLONOSCOPY     COLONOSCOPY WITH PROPOFOL N/A 05/28/2018   Procedure: COLONOSCOPY WITH PROPOFOL;  Surgeon:  Scot Jun, MD;  Location: [facility] ENDOSCOPY;  Service: Endoscopy;  Laterality: N/A;   ESOPHAGOGASTRODUODENOSCOPY (EGD) WITH PROPOFOL N/A 05/28/2018   Procedure: ESOPHAGOGASTRODUODENOSCOPY (EGD) WITH PROPOFOL;  Surgeon: Scot Jun, MD;  Location: [facility] ENDOSCOPY;  Service: Endoscopy;  Laterality: N/A;   HYSTEROSCOPY WITH D & C     and polyp removal   Family History  Problem Relation Age of Onset   Hypertension Mother    Diabetes Mother    Diverticulosis Mother    Depression Mother    Hypercholesterolemia Father    Hypertension Father    Cancer Father    Heart disease Maternal Grandfather    Diverticulosis Paternal Grandmother    Hyperlipidemia Paternal Grandmother    Heart failure Paternal Grandmother    Cancer Maternal Grandmother    Alcoholism Paternal Uncle    Social History   Socioeconomic History   Marital status: Single    Spouse name: Not on file   Number of children: 2   Years of education: Not on file   Highest education level: High school graduate  Occupational History    Employer: [employer]  Tobacco Use   Smoking status: Former    Current packs/day: 0.00    Types: Cigarettes    Quit date: 08/06/2000    Years since quitting: 22.9   Smokeless tobacco: Never  Vaping Use   Vaping status: Never Used  Substance and Sexual Activity   Alcohol use: Yes    Alcohol/week: 0.0  standard drinks of alcohol    Comment: rare   Drug use: No   Sexual activity: Yes    Birth control/protection: I.U.D.  Other Topics Concern   Not on file  Social History Narrative   Not on file   Social Drivers of Health   Financial Resource Strain: Low Risk  (07/26/2023)   Received from [facility]   Overall Financial Resource Strain (CARDIA)    Difficulty of Paying Living Expenses: Not hard at all  Food Insecurity: No Food Insecurity (07/26/2023)   Received from [facility]   Hunger Vital Sign    Worried About Running Out of Food in  the Last Year: Never true    Ran Out of Food in the Last Year: Never true  Transportation Needs: No Transportation Needs (07/26/2023)   Received from [facility] - Transportation    In the past 12 months, has lack of transportation kept you from medical appointments or from getting medications?: No    Lack of Transportation (Non-Medical): No  Physical Activity: Unknown (11/29/2022)   Exercise Vital Sign    Days of Exercise per Week: Patient declined    Minutes of Exercise per Session: Not on file  Stress: Stress Concern Present (11/29/2022)   [employer] of Occupational Health - Occupational Stress Questionnaire    Feeling of Stress : Rather much  Social Connections: Unknown (11/29/2022)   Social Connection and Isolation Panel [NHANES]    Frequency of Communication with Friends and Family: Patient declined    Frequency of Social Gatherings with Friends and Family: Patient declined    Attends Religious Services: Patient declined    Database administrator or Organizations: Patient declined    Attends Engineer, structural: Not on file    Marital Status: Patient declined     Review of Systems  Constitutional:  Negative for appetite change and unexpected weight change.  HENT:  Negative for congestion and sinus pressure.   Respiratory:  Negative for cough and chest tightness.        Breathing stable.   Cardiovascular:  Negative for chest pain and palpitations.  Gastrointestinal:  Positive for diarrhea. Negative for vomiting.  Genitourinary:  Negative for difficulty urinating and dysuria.  Musculoskeletal:  Negative for joint swelling and myalgias.  Skin:  Negative for color change and rash.  Neurological:  Negative for dizziness and headaches.  Psychiatric/Behavioral:         Increased stress.  Depression.  No SI.        Objective:     BP 128/76   Pulse 88   Temp 98 F (36.7 C)   Resp 16   Ht 5\' 8"  (1.727 m)   Wt (!) 307 lb (139.3  kg)   SpO2 99%   BMI 46.68 kg/m  Wt Readings from Last 3 Encounters:  07/25/23 (!) 307 lb (139.3 kg)  06/18/23 (!) 305 lb (138.3 kg)  05/29/23 (!) 304 lb (137.9 kg)    Physical Exam Vitals reviewed.  Constitutional:      General: She is not in acute distress.    Appearance: Normal appearance.  HENT:     Head: Normocephalic and atraumatic.     Right Ear: External ear normal.     Left Ear: External ear normal.  Eyes:     General: No scleral icterus.       Right eye: No discharge.        Left eye: No  discharge.     Conjunctiva/sclera: Conjunctivae normal.  Neck:     Thyroid: No thyromegaly.  Cardiovascular:     Rate and Rhythm: Normal rate and regular rhythm.  Pulmonary:     Effort: No respiratory distress.     Breath sounds: Normal breath sounds. No wheezing.  Abdominal:     General: Bowel sounds are normal.     Palpations: Abdomen is soft.     Tenderness: There is no abdominal tenderness.  Musculoskeletal:        General: No swelling or tenderness.     Cervical back: Neck supple. No tenderness.  Lymphadenopathy:     Cervical: No cervical adenopathy.  Skin:    Findings: No erythema or rash.  Neurological:     Mental Status: She is alert.  Psychiatric:        Mood and Affect: Mood normal.        Behavior: Behavior normal.      Outpatient Encounter Medications as of 07/25/2023  Medication Sig   albuterol (VENTOLIN HFA) 108 (90 Base) MCG/ACT inhaler Inhale 2 puffs into the lungs every 6 (six) hours as needed for wheezing. Patient request ProAir brand only.   amLODipine (NORVASC) 10 MG tablet Take 1/2 (one-half) tablet by mouth twice daily   Cholecalciferol (VITAMIN D3) 50 MCG (2000 UT) capsule Take 2,000 Units by mouth daily.   Cranberry (RA CRANBERRY) 500 MG CAPS Take 500 mg by mouth daily.   D-Mannose 500 MG CAPS Take 500 mg by mouth in the morning and at bedtime.   estradiol (ESTRACE) 0.1 MG/GM vaginal cream Place vaginally.   famotidine (PEPCID) 20 MG tablet  TAKE 2 TABLETS (40 MG) BY MOUTH NIGHTLY   fluticasone (FLONASE) 50 MCG/ACT nasal spray Place 1 spray into both nostrils daily.   glucose blood test strip Use as directed to check sugars BID. Dx E11.9   Lancets MISC by Does not apply route. Use 1 lancets three times a day   levonorgestrel (MIRENA) 20 MCG/DAY IUD by Intrauterine route.   loratadine (CLARITIN) 10 MG tablet Take 10 mg by mouth daily.   losartan (COZAAR) 100 MG tablet Take 1 tablet (100 mg total) by mouth daily.   metFORMIN (GLUCOPHAGE) 500 MG tablet Take 2 tablets (1,000 mg total) by mouth 2 (two) times daily.   metoprolol succinate (TOPROL-XL) 100 MG 24 hr tablet Take 0.5 tablets (50 mg total) by mouth 2 (two) times daily. Take with or immediately following a meal.   pravastatin (PRAVACHOL) 40 MG tablet Take 1 tablet (40 mg total) by mouth daily.   Probiotic Product (FEM-DOPHILUS WOMENS PO) Take 1 tablet by mouth daily.   No facility-administered encounter medications on file as of 07/25/2023.     Lab Results  Component Value Date   WBC 12.0 (H) 06/18/2023   HGB 11.7 (L) 06/18/2023   HCT 36.5 06/18/2023   PLT 395 06/18/2023   GLUCOSE 165 (H) 07/26/2023   CHOL 177 07/25/2023   TRIG 189.0 (H) 07/25/2023   HDL 43.00 07/25/2023   LDLDIRECT 132.0 11/02/2020   LDLCALC 96 07/25/2023   ALT 15 07/25/2023   AST 11 07/25/2023   NA 140 07/26/2023   K 4.3 07/26/2023   CL 103 07/26/2023   CREATININE 0.63 07/26/2023   BUN 15 07/26/2023   CO2 23 07/26/2023   TSH 3.55 07/25/2023   INR 0.93 05/05/2018   HGBA1C 8.1 (H) 07/25/2023   MICROALBUR 45.0 (H) 07/25/2023    CT CARDIAC SCORING (SELF PAY ONLY) Addendum  Date: 02/01/2023 ADDENDUM REPORT: 02/01/2023 16:28 EXAM: OVER-READ INTERPRETATION  CT CHEST The following report is an over-read performed by radiologist Dr. Narda Rutherford of [facility] on 02/01/2023. This over-read does not include interpretation of cardiac or coronary anatomy or pathology. The coronary  calcium score interpretation by the cardiologist is attached. COMPARISON:  None. FINDINGS: Vascular: No aortic atherosclerosis. The included aorta is normal in caliber. Mediastinum/nodes: No adenopathy or mass.  Minimal hiatal hernia. Lungs: No focal airspace disease. No pulmonary nodule. No pleural fluid. Scattered thin walled pulmonary cysts. The included airways are patent. Upper abdomen: No acute findings.  Hepatic steatosis. Musculoskeletal: There are no acute or suspicious osseous abnormalities. IMPRESSION: 1. Minimal hiatal hernia. 2. Hepatic steatosis. Electronically Signed   By: Narda Rutherford M.D.   On: 02/01/2023 16:28   Result Date: 02/01/2023 CLINICAL DATA:  Risk stratification EXAM: Coronary Calcium Score TECHNIQUE: The patient was scanned on a Siemens Somatom scanner. Axial non-contrast 3 mm slices were carried out through the heart. The data set was analyzed on a dedicated work station and scored using the Agatson method. FINDINGS: Non-cardiac: See separate report from [facility]. Ascending Aorta: Normal size Pericardium: Normal Coronary arteries: Normal origin of left and right coronary arteries. Distribution of arterial calcifications if present, as noted below; LM 0 LAD 0 LCx 0 RCA 0 Total 0 IMPRESSION AND RECOMMENDATION: 1. Coronary calcium score of 0. 2. CAC 0, CAC-DRS A0. 3. Continue heart healthy lifestyle and risk factor modification. Electronically Signed: By: Debbe Odea M.D. On: 01/28/2023 16:46       Assessment & Plan:  Thrombocytosis (HCC) Assessment & Plan: Has been evaluated by hematology.  Felt to be reactive.  Hematology following cbc.  Receiving iron infusions.    Type 2 diabetes mellitus with hyperglycemia, without long-term current use of insulin (HCC) Assessment & Plan: Have discussed diet and exercise.  Have previous discussed importance of getting sugars under control.  Have discussed medications.  Discussed treatment options today. Has desired  not to add medications or change medication. Follow met b and A1c.  Appears that her bowel issues are improved off metformin.  Discussed change. Will notify me when agreeable for change.   Orders: -     Microalbumin / creatinine urine ratio -     Hemoglobin A1c -     Basic metabolic panel; Future  Hypercholesterolemia Assessment & Plan: Continue pravastatin.  Diet and exercise.  Had previously discussed changing to crestor.  Had wanted to hold on change.  Check lipid panel with next labs. Follow lipid panel and liver function tests.   Orders: -     TSH -     Hepatic function panel -     Lipid panel  Stress Assessment & Plan: Increased stress as outlined.  Continues to follow with psychiatry as outlined.    Severe obesity (BMI >= 40) (HCC) Assessment & Plan: Discussed diet and exercise.  Follow.    Renal cell carcinoma, unspecified laterality [facility]) Assessment & Plan: S/p recent partial nephrectomy as outlined.  Continues f/u with urology.     Iron deficiency anemia, unspecified iron deficiency anemia type Assessment & Plan:  Seeing Dr Donneta Romberg for f/u anemia.  Receiving intermittent venofer infusion.    Primary hypertension Assessment & Plan: Continue losartan, metoprolol and amlodipine.  Blood pressures as outlined. No changes today. Follow pressures.  Follow metabolic panel.     Gastroesophageal reflux disease without esophagitis Assessment & Plan: On prilosec.  Continue.  Dale Wildwood Crest, MD

## 2023-07-26 ENCOUNTER — Ambulatory Visit (INDEPENDENT_AMBULATORY_CARE_PROVIDER_SITE_OTHER): Payer: BC Managed Care – PPO

## 2023-07-26 ENCOUNTER — Inpatient Hospital Stay: Payer: BC Managed Care – PPO

## 2023-07-26 VITALS — BP 163/75 | HR 85 | Resp 16

## 2023-07-26 DIAGNOSIS — D509 Iron deficiency anemia, unspecified: Secondary | ICD-10-CM

## 2023-07-26 DIAGNOSIS — E1165 Type 2 diabetes mellitus with hyperglycemia: Secondary | ICD-10-CM | POA: Diagnosis not present

## 2023-07-26 LAB — BASIC METABOLIC PANEL
BUN: 15 mg/dL (ref 6–23)
CO2: 23 meq/L (ref 19–32)
Calcium: 9.5 mg/dL (ref 8.4–10.5)
Chloride: 103 meq/L (ref 96–112)
Creatinine, Ser: 0.63 mg/dL (ref 0.40–1.20)
GFR: 107.46 mL/min (ref >60.00)
Glucose, Bld: 165 mg/dL — ABNORMAL HIGH (ref 70–99)
Potassium: 4.3 meq/L (ref 3.5–5.1)
Sodium: 140 meq/L (ref 135–145)

## 2023-07-26 MED ORDER — IRON SUCROSE 20 MG/ML IV SOLN
200.0000 mg | Freq: Once | INTRAVENOUS | Status: AC
  Administered 2023-07-26: 200 mg via INTRAVENOUS
  Filled 2023-07-26: qty 10

## 2023-07-28 ENCOUNTER — Encounter: Payer: Self-pay | Admitting: Internal Medicine

## 2023-07-28 NOTE — Assessment & Plan Note (Signed)
Low carb diet and exercise.  On metformin.  Appears that diarrhea is better off the medication. Have discussed other treatment options as outlined.  Has declined to start.  Continues to decline at this time. Follow met b and a1c. Once we get above issues with her work/stress, etc - then will work on her diabetes and diet/exercise.

## 2023-07-28 NOTE — Assessment & Plan Note (Signed)
Continue pravastatin.  Diet and exercise.  Had previously discussed changing to crestor.  Had wanted to hold on change.  Check lipid panel with next labs. Follow lipid panel and liver function tests.

## 2023-07-28 NOTE — Assessment & Plan Note (Signed)
S/p recent partial nephrectomy as outlined.  Continues f/u with urology.

## 2023-07-28 NOTE — Assessment & Plan Note (Signed)
Has been evaluated by hematology.  Felt to be reactive.  Hematology following cbc.  Receiving iron infusions.

## 2023-07-28 NOTE — Assessment & Plan Note (Signed)
Seeing Dr Donneta Romberg for f/u anemia.  Receiving intermittent venofer infusion.

## 2023-07-28 NOTE — Assessment & Plan Note (Signed)
On prilosec.  Continue.

## 2023-07-28 NOTE — Assessment & Plan Note (Signed)
Increased stress as outlined.  Continues to follow with psychiatry as outlined.

## 2023-07-28 NOTE — Assessment & Plan Note (Signed)
Discussed diet and exercise.  Follow.  

## 2023-07-28 NOTE — Assessment & Plan Note (Signed)
Continue losartan, metoprolol and amlodipine.  Blood pressures as outlined. No changes today. Follow pressures.  Follow metabolic panel.

## 2023-07-28 NOTE — Assessment & Plan Note (Signed)
Have discussed diet and exercise.  Have previous discussed importance of getting sugars under control.  Have discussed medications.  Discussed treatment options today. Has desired not to add medications or change medication. Follow met b and A1c.  Appears that her bowel issues are improved off metformin.  Discussed change. Will notify me when agreeable for change.

## 2023-08-05 ENCOUNTER — Encounter: Payer: Self-pay | Admitting: Internal Medicine

## 2023-08-08 ENCOUNTER — Encounter: Payer: Self-pay | Admitting: Internal Medicine

## 2023-08-09 ENCOUNTER — Encounter: Payer: Self-pay | Admitting: Internal Medicine

## 2023-09-06 ENCOUNTER — Telehealth: Payer: 59 | Admitting: Internal Medicine

## 2023-09-06 ENCOUNTER — Encounter: Payer: Self-pay | Admitting: Internal Medicine

## 2023-09-06 DIAGNOSIS — D509 Iron deficiency anemia, unspecified: Secondary | ICD-10-CM

## 2023-09-06 DIAGNOSIS — N390 Urinary tract infection, site not specified: Secondary | ICD-10-CM

## 2023-09-06 DIAGNOSIS — I10 Essential (primary) hypertension: Secondary | ICD-10-CM

## 2023-09-06 DIAGNOSIS — K219 Gastro-esophageal reflux disease without esophagitis: Secondary | ICD-10-CM

## 2023-09-06 DIAGNOSIS — D75839 Thrombocytosis, unspecified: Secondary | ICD-10-CM

## 2023-09-06 DIAGNOSIS — E1165 Type 2 diabetes mellitus with hyperglycemia: Secondary | ICD-10-CM | POA: Diagnosis not present

## 2023-09-06 DIAGNOSIS — N200 Calculus of kidney: Secondary | ICD-10-CM

## 2023-09-06 DIAGNOSIS — C649 Malignant neoplasm of unspecified kidney, except renal pelvis: Secondary | ICD-10-CM

## 2023-09-06 DIAGNOSIS — E7800 Pure hypercholesterolemia, unspecified: Secondary | ICD-10-CM

## 2023-09-06 DIAGNOSIS — Z7984 Long term (current) use of oral hypoglycemic drugs: Secondary | ICD-10-CM | POA: Diagnosis not present

## 2023-09-06 NOTE — Progress Notes (Unsigned)
Patient ID: Lindsey Strickland, female   DOB: [birth year], 48 y.o.   MRN: [id number]   Virtual Visit via video Note  I connected with Aleshka Corney by a video enabled telemedicine application and verified that I am speaking with the correct person using two identifiers. Location patient: home Location provider: work  Persons participating in the virtual visit: patient, provider  The limitations, risks, security and privacy concerns of performing an evaluation and management service by video and the availability of in person appointments have been discussed. It has also been discussed with the patient that there may be a patient responsible charge related to this service. The patient expressed understanding and agreed to proceed.   Reason for visit: follow up appt  HPI: Follow up regarding diabetes and hypertension. Wanted to discuss her urology evaluation and w/up. Had f/u 08/27/23 - left flank pain and staghorn calculus. Stone burden felt to be too large to pass spontaneously. Recommended return for percutaneous nephrolithotomy.  Discussed the procedure and risk of the procedure. She is not checking her sugars. Only on metformin. Would have to be off for procedure. Discussed other treatment options. Discussed SGLT 2 inhibitors - would probably avoid at this time given her issues with recurring UTIs. Discussed GLP 1 agonist and possible GI side effects. Also discussed other oral agents. Have discussed referral to endocrinology. No chest pain reported. Breathing overall stable.  Did start viibryd around 08/03/23. Started with 1/2 tablet. Had f/u with psych last Friday. Elected to continue current dose of medication. Feels SI is less. Still with persistent diarrhea - worse at times. Discussed possible side effect of metformin. Planning f/u with GI - planning for colonoscopy.    ROS: See pertinent positives and negatives per HPI.  Past Medical History:  Diagnosis Date   Abnormal bleeding in menstrual  cycle    Anemia    Atypical endometrial hyperplasia    Cancer (HCC)    Clear cell carcinoma of kidney (HCC)    Diabetes mellitus (HCC)    Diarrhea    Environmental allergies    GAD (generalized anxiety disorder)    Generalized anxiety disorder    GERD (gastroesophageal reflux disease)    Hypercholesterolemia    Hypertension    Insomnia    Leukocytosis    Major depressive disorder    Palpitations    Symptomatic anemia    Thrombocytosis    Vitamin D deficiency     Past Surgical History:  Procedure Laterality Date   COLONOSCOPY     COLONOSCOPY WITH PROPOFOL N/A 05/28/2018   Procedure: COLONOSCOPY WITH PROPOFOL;  Surgeon: Scot Jun, MD;  Location: [facility] ENDOSCOPY;  Service: Endoscopy;  Laterality: N/A;   ESOPHAGOGASTRODUODENOSCOPY (EGD) WITH PROPOFOL N/A 05/28/2018   Procedure: ESOPHAGOGASTRODUODENOSCOPY (EGD) WITH PROPOFOL;  Surgeon: Scot Jun, MD;  Location: [facility] ENDOSCOPY;  Service: Endoscopy;  Laterality: N/A;   HYSTEROSCOPY WITH D & C     and polyp removal    Family History  Problem Relation Age of Onset   Hypertension Mother    Diabetes Mother    Diverticulosis Mother    Depression Mother    Hypercholesterolemia Father    Hypertension Father    Cancer Father    Heart disease Maternal Grandfather    Diverticulosis Paternal Grandmother    Hyperlipidemia Paternal Grandmother    Heart failure Paternal Grandmother    Cancer Maternal Grandmother    Alcoholism Paternal Uncle     SOCIAL HX: reviewed.    Current Outpatient Medications:  albuterol (VENTOLIN HFA) 108 (90 Base) MCG/ACT inhaler, Inhale 2 puffs into the lungs every 6 (six) hours as needed for wheezing. Patient request ProAir brand only., Disp: 18 g, Rfl: 2   amLODipine (NORVASC) 10 MG tablet, Take 1/2 (one-half) tablet by mouth twice daily, Disp: 90 tablet, Rfl: 2   Cholecalciferol (VITAMIN D3) 50 MCG (2000 UT) capsule, Take 2,000 Units by mouth daily., Disp: , Rfl:    Cranberry (RA  CRANBERRY) 500 MG CAPS, Take 500 mg by mouth daily., Disp: , Rfl:    D-Mannose 500 MG CAPS, Take 500 mg by mouth in the morning and at bedtime., Disp: , Rfl:    estradiol (ESTRACE) 0.1 MG/GM vaginal cream, Place vaginally., Disp: , Rfl:    famotidine (PEPCID) 20 MG tablet, TAKE 2 TABLETS (40 MG) BY MOUTH NIGHTLY, Disp: , Rfl:    fluticasone (FLONASE) 50 MCG/ACT nasal spray, Place 1 spray into both nostrils daily., Disp: 48 g, Rfl: 1   glucose blood test strip, Use as directed to check sugars BID. Dx E11.9, Disp: 100 each, Rfl: 12   Lancets MISC, by Does not apply route. Use 1 lancets three times a day, Disp: , Rfl:    levonorgestrel (MIRENA) 20 MCG/DAY IUD, by Intrauterine route., Disp: , Rfl:    loratadine (CLARITIN) 10 MG tablet, Take 10 mg by mouth daily., Disp: , Rfl:    losartan (COZAAR) 100 MG tablet, Take 1 tablet (100 mg total) by mouth daily., Disp: 90 tablet, Rfl: 2   metFORMIN (GLUCOPHAGE) 500 MG tablet, Take 2 tablets (1,000 mg total) by mouth 2 (two) times daily., Disp: 360 tablet, Rfl: 1   metoprolol succinate (TOPROL-XL) 100 MG 24 hr tablet, Take 0.5 tablets (50 mg total) by mouth 2 (two) times daily. Take with or immediately following a meal., Disp: 90 tablet, Rfl: 2   pravastatin (PRAVACHOL) 40 MG tablet, Take 1 tablet (40 mg total) by mouth daily., Disp: 90 tablet, Rfl: 1   Probiotic Product (FEM-DOPHILUS WOMENS PO), Take 1 tablet by mouth daily., Disp: , Rfl:    Vilazodone HCl (VIIBRYD) 10 MG TABS, Take 5 mg by mouth daily., Disp: , Rfl:   EXAM:  GENERAL: alert, oriented, appears well and in no acute distress  HEENT: atraumatic, conjunttiva clear, no obvious abnormalities on inspection of external nose and ears  NECK: normal movements of the head and neck  LUNGS: on inspection no signs of respiratory distress, breathing rate appears normal, no obvious gross SOB, gasping or wheezing  CV: no obvious cyanosis  PSYCH/NEURO: pleasant and cooperative, no obvious depression or  anxiety, speech and thought processing grossly intact  ASSESSMENT AND PLAN:  Discussed the following assessment and plan:  Problem List Items Addressed This Visit     Thrombocytosis (HCC)   Has been evaluated by hematology.  Felt to be reactive.  Hematology following cbc.  Receiving iron infusions.       Severe obesity (BMI >= 40) (HCC)   Discussed diet and exercise.  Follow.       Renal cell carcinoma (HCC)   S/p recent partial nephrectomy as outlined.  Continues f/u with urology for surveillance.       Nephrolithiasis   Had f/u 08/27/23 - left flank pain and staghorn calculus. Stone burden felt to be too large to pass spontaneously. Recommended return for percutaneous nephrolithotomy. Discussed risk of surgery. Discussed the need to get sugars under better control.       Iron deficiency anemia    Seeing Dr  Brahmanday for f/u anemia.  Receiving intermittent venofer infusion.       Hypertension   Continue losartan, metoprolol and amlodipine.  No changes today. Follow pressures.  Follow metabolic panel.        Hypercholesterolemia   Continue pravastatin.  Diet and exercise.  Had previously discussed changing to crestor.  Had wanted to hold on change.  Check lipid panel with next labs. Follow lipid panel and liver function tests.       GERD (gastroesophageal reflux disease)   On prilosec.  Continue.        Frequent UTI   Evaluated [facility] - recurring UTIs. Had previously recommended topical estrogen and hydration. On keflex.  Follow. Concern that stones may be contributing some to recurring infections.  Continue f/u with urology. Had f/u 08/27/23 - left flank pain and staghorn calculus. Stone burden felt to be too large to pass spontaneously. Recommended return for percutaneous nephrolithotomy.       Diabetes mellitus (HCC) - Primary   Have discussed diet and exercise.  Have discussed importance of getting sugars under control, especially given possible upcoming procedure. Have  discussed medications.  Discussed treatment options today. Discussed GLP 1 agonist - including mounjaro and ozempic.  Also discussed if desires no injection - oral form - rybelsus. Discussed possible side effects, GI side effects.  Also discussed SGLT2 inhibitors - including farxiga and jardiance. Discussed risk of increased UTIs and yeast infections with these medications. Discussed other oral hypoglycemics, including glipizide, januvia. Has desired not to add medications or change medication at this time. Concern metformin contributing to increased diarrhea.  Follow met b and A1c.  Will notify me if changes her mind regarding changing medication.        No follow-ups on file.   I discussed the assessment and treatment plan with the patient. The patient was provided an opportunity to ask questions and all were answered. The patient agreed with the plan and demonstrated an understanding of the instructions.   The patient was advised to call back or seek an in-person evaluation if the symptoms worsen or if the condition fails to improve as anticipated.    Dale Newcastle, MD

## 2023-09-07 ENCOUNTER — Encounter: Payer: Self-pay | Admitting: Internal Medicine

## 2023-09-08 ENCOUNTER — Encounter: Payer: Self-pay | Admitting: Internal Medicine

## 2023-09-08 DIAGNOSIS — N200 Calculus of kidney: Secondary | ICD-10-CM | POA: Insufficient documentation

## 2023-09-08 NOTE — Assessment & Plan Note (Signed)
Seeing Dr Donneta Romberg for f/u anemia.  Receiving intermittent venofer infusion.

## 2023-09-08 NOTE — Assessment & Plan Note (Signed)
Continue pravastatin.  Diet and exercise.  Had previously discussed changing to crestor.  Had wanted to hold on change.  Check lipid panel with next labs. Follow lipid panel and liver function tests.

## 2023-09-08 NOTE — Assessment & Plan Note (Signed)
Discussed diet and exercise.  Follow.  

## 2023-09-08 NOTE — Assessment & Plan Note (Signed)
On prilosec.  Continue.

## 2023-09-08 NOTE — Assessment & Plan Note (Signed)
S/p recent partial nephrectomy as outlined.  Continues f/u with urology for surveillance.

## 2023-09-08 NOTE — Assessment & Plan Note (Signed)
Has been evaluated by hematology.  Felt to be reactive.  Hematology following cbc.  Receiving iron infusions.

## 2023-09-08 NOTE — Assessment & Plan Note (Signed)
Evaluated [facility] - recurring UTIs. Had previously recommended topical estrogen and hydration. On keflex.  Follow. Concern that stones may be contributing some to recurring infections.  Continue f/u with urology. Had f/u 08/27/23 - left flank pain and staghorn calculus. Stone burden felt to be too large to pass spontaneously. Recommended return for percutaneous nephrolithotomy.

## 2023-09-08 NOTE — Assessment & Plan Note (Signed)
Have discussed diet and exercise.  Have discussed importance of getting sugars under control, especially given possible upcoming procedure. Have discussed medications.  Discussed treatment options today. Discussed GLP 1 agonist - including mounjaro and ozempic.  Also discussed if desires no injection - oral form - rybelsus. Discussed possible side effects, GI side effects.  Also discussed SGLT2 inhibitors - including farxiga and jardiance. Discussed risk of increased UTIs and yeast infections with these medications. Discussed other oral hypoglycemics, including glipizide, januvia. Has desired not to add medications or change medication at this time. Concern metformin contributing to increased diarrhea.  Follow met b and A1c.  Will notify me if changes her mind regarding changing medication.

## 2023-09-08 NOTE — Assessment & Plan Note (Signed)
Had f/u 08/27/23 - left flank pain and staghorn calculus. Stone burden felt to be too large to pass spontaneously. Recommended return for percutaneous nephrolithotomy. Discussed risk of surgery. Discussed the need to get sugars under better control.

## 2023-09-08 NOTE — Assessment & Plan Note (Signed)
Continue losartan, metoprolol and amlodipine.  No changes today. Follow pressures.  Follow metabolic panel.

## 2023-09-09 NOTE — Telephone Encounter (Signed)
Please call her. We discussed concerns regarding her surgery during her appt Friday. If continued problems, can schedule f/u appt to readress. Discussed the need for better sugar control and for close monitoring of respiratory status during surgery. Ok to schedule f/u appt

## 2023-09-09 NOTE — Telephone Encounter (Signed)
Patietn scheduled. Unable to do tomorrow so scheduled for thurs.

## 2023-09-10 LAB — OPHTHALMOLOGY REPORT-SCANNED

## 2023-09-12 ENCOUNTER — Telehealth: Payer: 59 | Admitting: Internal Medicine

## 2023-09-12 VITALS — Ht 68.0 in | Wt 307.0 lb

## 2023-09-12 DIAGNOSIS — C649 Malignant neoplasm of unspecified kidney, except renal pelvis: Secondary | ICD-10-CM

## 2023-09-12 DIAGNOSIS — E1165 Type 2 diabetes mellitus with hyperglycemia: Secondary | ICD-10-CM

## 2023-09-12 DIAGNOSIS — I10 Essential (primary) hypertension: Secondary | ICD-10-CM | POA: Diagnosis not present

## 2023-09-12 DIAGNOSIS — K219 Gastro-esophageal reflux disease without esophagitis: Secondary | ICD-10-CM

## 2023-09-12 DIAGNOSIS — E7800 Pure hypercholesterolemia, unspecified: Secondary | ICD-10-CM | POA: Diagnosis not present

## 2023-09-12 DIAGNOSIS — D509 Iron deficiency anemia, unspecified: Secondary | ICD-10-CM

## 2023-09-12 DIAGNOSIS — F439 Reaction to severe stress, unspecified: Secondary | ICD-10-CM

## 2023-09-12 NOTE — Progress Notes (Signed)
 Patient ID: Lindsey Strickland, female   DOB: [birth year], 48 y.o.   MRN: [id number]   Virtual Visit via vidoe Note  I connected with Lindsey Strickland by a video enabled telemedicine application and verified that I am speaking with the correct person using two identifiers. Location patient: home Location provider: work  Persons participating in the virtual visit: patient, provider  The limitations, risks, security and privacy concerns of performing an evaluation and management service by video and the availability of in person appointments have been discussed. It has also been discussed with the patient that there may be a patient responsible charge related to this service. The patient expressed understanding and agreed to proceed.   Reason for visit: work in appt.   HPI: Work in to discuss plans for upcoming surgery. . Wanted to discuss her urology evaluation and w/up. Had f/u 08/27/23 - left flank pain and staghorn calculus. Stone burden felt to be too large to pass spontaneously. Recommended return for percutaneous nephrolithotomy. Discussed the procedure and risk of the procedure. Discussed the need for better sugar control - to help with healing, recovery, surgery, etc. Needs good blood pressure control as well. She has contacted Dr Theotis and reports that from a pulmonary standpoint - ok.  Breathing is stable. Had questions about lying on her stomach. Discussed she is going to be monitored through the procedure - anesthesiologist - per her report. No chest pain. Discussed again regarding diabetes treatment. Elects to remain on her current treatment for now. Wants to work on diet and exercise.    ROS: See pertinent positives and negatives per HPI.  Past Medical History:  Diagnosis Date   Abnormal bleeding in menstrual cycle    Anemia    Atypical endometrial hyperplasia    Cancer (HCC)    Clear cell carcinoma of kidney (HCC)    Diabetes mellitus (HCC)    Diarrhea    Environmental  allergies    GAD (generalized anxiety disorder)    Generalized anxiety disorder    GERD (gastroesophageal reflux disease)    Hypercholesterolemia    Hypertension    Insomnia    Leukocytosis    Major depressive disorder    Palpitations    Symptomatic anemia    Thrombocytosis    Vitamin D  deficiency     Past Surgical History:  Procedure Laterality Date   COLONOSCOPY     COLONOSCOPY WITH PROPOFOL  N/A 05/28/2018   Procedure: COLONOSCOPY WITH PROPOFOL ;  Surgeon: Lindsey Lamar DASEN, MD;  Location: [facility] ENDOSCOPY;  Service: Endoscopy;  Laterality: N/A;   ESOPHAGOGASTRODUODENOSCOPY (EGD) WITH PROPOFOL  N/A 05/28/2018   Procedure: ESOPHAGOGASTRODUODENOSCOPY (EGD) WITH PROPOFOL ;  Surgeon: Lindsey Lamar DASEN, MD;  Location: [facility] ENDOSCOPY;  Service: Endoscopy;  Laterality: N/A;   HYSTEROSCOPY WITH D & C     and polyp removal    Family History  Problem Relation Age of Onset   Hypertension Mother    Diabetes Mother    Diverticulosis Mother    Depression Mother    Hypercholesterolemia Father    Hypertension Father    Cancer Father    Heart disease Maternal Grandfather    Diverticulosis Paternal Grandmother    Hyperlipidemia Paternal Grandmother    Heart failure Paternal Grandmother    Cancer Maternal Grandmother    Alcoholism Paternal Uncle     SOCIAL HX: reviewed.    Current Outpatient Medications:    albuterol  (VENTOLIN  HFA) 108 (90 Base) MCG/ACT inhaler, Inhale 2 puffs into the lungs every 6 (six) hours as  needed for wheezing. Patient request ProAir  brand only., Disp: 18 g, Rfl: 2   amLODipine  (NORVASC ) 10 MG tablet, Take 1/2 (one-half) tablet by mouth twice daily, Disp: 90 tablet, Rfl: 2   Cholecalciferol (VITAMIN D3) 50 MCG (2000 UT) capsule, Take 2,000 Units by mouth daily., Disp: , Rfl:    Cranberry (RA CRANBERRY) 500 MG CAPS, Take 500 mg by mouth daily., Disp: , Rfl:    D-Mannose 500 MG CAPS, Take 500 mg by mouth in the morning and at bedtime., Disp: , Rfl:    estradiol  (ESTRACE) 0.1 MG/GM vaginal cream, Place vaginally., Disp: , Rfl:    famotidine (PEPCID) 20 MG tablet, TAKE 2 TABLETS (40 MG) BY MOUTH NIGHTLY, Disp: , Rfl:    fluticasone  (FLONASE ) 50 MCG/ACT nasal spray, Place 1 spray into both nostrils daily., Disp: 48 g, Rfl: 1   glucose blood test strip, Use as directed to check sugars BID. Dx E11.9, Disp: 100 each, Rfl: 12   Lancets MISC, by Does not apply route. Use 1 lancets three times a day, Disp: , Rfl:    levonorgestrel (MIRENA) 20 MCG/DAY IUD, by Intrauterine route., Disp: , Rfl:    loratadine (CLARITIN) 10 MG tablet, Take 10 mg by mouth daily., Disp: , Rfl:    losartan  (COZAAR ) 100 MG tablet, Take 1 tablet (100 mg total) by mouth daily., Disp: 90 tablet, Rfl: 2   metFORMIN  (GLUCOPHAGE ) 500 MG tablet, Take 2 tablets (1,000 mg total) by mouth 2 (two) times daily., Disp: 360 tablet, Rfl: 1   metoprolol  succinate (TOPROL -XL) 100 MG 24 hr tablet, Take 0.5 tablets (50 mg total) by mouth 2 (two) times daily. Take with or immediately following a meal., Disp: 90 tablet, Rfl: 2   pravastatin  (PRAVACHOL ) 40 MG tablet, Take 1 tablet (40 mg total) by mouth daily., Disp: 90 tablet, Rfl: 1   Probiotic Product (FEM-DOPHILUS WOMENS PO), Take 1 tablet by mouth daily., Disp: , Rfl:    Vilazodone  HCl (VIIBRYD ) 10 MG TABS, Take 5 mg by mouth daily., Disp: , Rfl:   EXAM:  GENERAL: alert, oriented, appears well and in no acute distress  HEENT: atraumatic, conjunttiva clear, no obvious abnormalities on inspection of external nose and ears  NECK: normal movements of the head and neck  LUNGS: on inspection no signs of respiratory distress, breathing rate appears normal, no obvious gross SOB, gasping or wheezing  CV: no obvious cyanosis  PSYCH/NEURO: pleasant and cooperative, no obvious depression or anxiety, speech and thought processing grossly intact  ASSESSMENT AND PLAN:  Discussed the following assessment and plan:  Problem List Items Addressed This Visit      Stress   Increased stress related to current medical concerns. Working from home. Has good support. Follow.       Renal cell carcinoma (HCC)   S/p recent partial nephrectomy as outlined.  Continues f/u with urology for surveillance.       Iron  deficiency anemia    Seeing Dr Rennie for f/u anemia.  Receiving intermittent venofer  infusion.       Hypertension   Continue losartan , metoprolol  and amlodipine .  Follow pressures.  Follow metabolic panel.        Hypercholesterolemia   Continue pravastatin .  Diet and exercise.  Had previously discussed changing to crestor.  Had wanted to hold on change.  Check lipid panel with next labs. Follow lipid panel and liver function tests.       GERD (gastroesophageal reflux disease)   On prilosec.  Continue.  Diabetes mellitus (HCC) - Primary   Have discussed diet and exercise.  Have discussed importance of getting sugars under control, especially given possible upcoming procedure. Have discussed medications.  Have discussed treatment options. Discussed GLP 1 agonist - including mounjaro and ozempic .  Also discussed if desires no injection - oral form - rybelsus. Discussed possible side effects, GI side effects.  Also discussed SGLT2 inhibitors - including farxiga and jardiance. Have discussed risk of increased UTIs and yeast infections with these medications. Have discussed other oral hypoglycemics, including glipizide, januvia. Has desired not to add medications or change medication at this time. Concern metformin  contributing to increased diarrhea.  Follow met b and A1c.  Will notify me if changes her mind regarding changing medication.        No follow-ups on file.   I discussed the assessment and treatment plan with the patient. The patient was provided an opportunity to ask questions and all were answered. The patient agreed with the plan and demonstrated an understanding of the instructions.   The patient was advised to call  back or seek an in-person evaluation if the symptoms worsen or if the condition fails to improve as anticipated.   Allena Hamilton, MD

## 2023-09-15 ENCOUNTER — Encounter: Payer: Self-pay | Admitting: Internal Medicine

## 2023-09-15 NOTE — Assessment & Plan Note (Signed)
Continue losartan, metoprolol and amlodipine.  Follow pressures.  Follow metabolic panel.  

## 2023-09-15 NOTE — Assessment & Plan Note (Signed)
 S/p recent partial nephrectomy as outlined.  Continues f/u with urology for surveillance.

## 2023-09-15 NOTE — Assessment & Plan Note (Signed)
Continue pravastatin.  Diet and exercise.  Had previously discussed changing to crestor.  Had wanted to hold on change.  Check lipid panel with next labs. Follow lipid panel and liver function tests.

## 2023-09-15 NOTE — Assessment & Plan Note (Signed)
 Increased stress related to current medical concerns. Working from home. Has good support. Follow.

## 2023-09-15 NOTE — Assessment & Plan Note (Signed)
 Have discussed diet and exercise.  Have discussed importance of getting sugars under control, especially given possible upcoming procedure. Have discussed medications.  Have discussed treatment options. Discussed GLP 1 agonist - including mounjaro and ozempic .  Also discussed if desires no injection - oral form - rybelsus. Discussed possible side effects, GI side effects.  Also discussed SGLT2 inhibitors - including farxiga and jardiance. Have discussed risk of increased UTIs and yeast infections with these medications. Have discussed other oral hypoglycemics, including glipizide, januvia. Has desired not to add medications or change medication at this time. Concern metformin  contributing to increased diarrhea.  Follow met b and A1c.  Will notify me if changes her mind regarding changing medication.

## 2023-09-15 NOTE — Assessment & Plan Note (Signed)
Seeing Dr Donneta Romberg for f/u anemia.  Receiving intermittent venofer infusion.

## 2023-09-15 NOTE — Assessment & Plan Note (Signed)
On prilosec.  Continue.

## 2023-09-16 ENCOUNTER — Other Ambulatory Visit: Payer: Self-pay | Admitting: *Deleted

## 2023-09-16 DIAGNOSIS — D509 Iron deficiency anemia, unspecified: Secondary | ICD-10-CM

## 2023-09-17 ENCOUNTER — Inpatient Hospital Stay (HOSPITAL_BASED_OUTPATIENT_CLINIC_OR_DEPARTMENT_OTHER): Payer: 59 | Admitting: Internal Medicine

## 2023-09-17 ENCOUNTER — Encounter: Payer: Self-pay | Admitting: Internal Medicine

## 2023-09-17 ENCOUNTER — Inpatient Hospital Stay: Payer: 59

## 2023-09-17 ENCOUNTER — Inpatient Hospital Stay: Payer: 59 | Attending: Internal Medicine

## 2023-09-17 VITALS — BP 133/69 | HR 104 | Temp 100.1°F | Ht 68.0 in | Wt 309.6 lb

## 2023-09-17 VITALS — BP 160/84 | HR 98 | Temp 99.1°F

## 2023-09-17 DIAGNOSIS — D649 Anemia, unspecified: Secondary | ICD-10-CM

## 2023-09-17 DIAGNOSIS — R197 Diarrhea, unspecified: Secondary | ICD-10-CM | POA: Diagnosis not present

## 2023-09-17 DIAGNOSIS — D509 Iron deficiency anemia, unspecified: Secondary | ICD-10-CM | POA: Diagnosis present

## 2023-09-17 DIAGNOSIS — E559 Vitamin D deficiency, unspecified: Secondary | ICD-10-CM | POA: Insufficient documentation

## 2023-09-17 LAB — BASIC METABOLIC PANEL - CANCER CENTER ONLY
Anion gap: 11 (ref 5–15)
BUN: 17 mg/dL (ref 6–20)
CO2: 23 mmol/L (ref 22–32)
Calcium: 9.1 mg/dL (ref 8.9–10.3)
Chloride: 101 mmol/L (ref 98–111)
Creatinine: 0.56 mg/dL (ref 0.44–1.00)
GFR, Estimated: >60 mL/min (ref >60)
Glucose, Bld: 117 mg/dL — ABNORMAL HIGH (ref 70–99)
Potassium: 4.1 mmol/L (ref 3.5–5.1)
Sodium: 135 mmol/L (ref 135–145)

## 2023-09-17 LAB — CBC WITH DIFFERENTIAL (CANCER CENTER ONLY)
Abs Immature Granulocytes: 0.07 10*3/uL (ref 0.00–0.07)
Basophils Absolute: 0 10*3/uL (ref 0.0–0.1)
Basophils Relative: 0 %
Eosinophils Absolute: 0.2 10*3/uL (ref 0.0–0.5)
Eosinophils Relative: 2 %
HCT: 38.2 % (ref 36.0–46.0)
Hemoglobin: 12.1 g/dL (ref 12.0–15.0)
Immature Granulocytes: 1 %
Lymphocytes Relative: 25 %
Lymphs Abs: 2.8 10*3/uL (ref 0.7–4.0)
MCH: 28.3 pg (ref 26.0–34.0)
MCHC: 31.7 g/dL (ref 30.0–36.0)
MCV: 89.3 fL (ref 80.0–100.0)
Monocytes Absolute: 0.6 10*3/uL (ref 0.1–1.0)
Monocytes Relative: 6 %
Neutro Abs: 7.7 10*3/uL (ref 1.7–7.7)
Neutrophils Relative %: 66 %
Platelet Count: 397 10*3/uL (ref 150–400)
RBC: 4.28 MIL/uL (ref 3.87–5.11)
RDW: 14.5 % (ref 11.5–15.5)
WBC Count: 11.5 10*3/uL — ABNORMAL HIGH (ref 4.0–10.5)
nRBC: 0 % (ref 0.0–0.2)

## 2023-09-17 LAB — FERRITIN: Ferritin: 60 ng/mL (ref 11–307)

## 2023-09-17 LAB — IRON AND TIBC
Iron: 76 ug/dL (ref 28–170)
Saturation Ratios: 17 % (ref 10.4–31.8)
TIBC: 441 ug/dL (ref 250–450)
UIBC: 365 ug/dL

## 2023-09-17 MED ORDER — IRON SUCROSE 20 MG/ML IV SOLN
200.0000 mg | Freq: Once | INTRAVENOUS | Status: AC
  Administered 2023-09-17: 200 mg via INTRAVENOUS

## 2023-09-17 NOTE — Assessment & Plan Note (Addendum)
#   Anemia- Hb- 10.5; Ferritin- 8.5 [PCP- APRIL 2024] symptomatic.  Likely due to iron deficiency -menorrhagia.  Poor tolerance/lack of improvement on oral iron.    # Today hemoglobin 12.2-  Proceed with infusion.   # Etiology of iron deficiency: s/p IUD-Heavy  Menstrual cycles- Amy Bryant; GYN [[facility]]- colo 2019- internal bleed [[facility]]   # Hx of right RCC [incidental- Dr.raynor- [facility] ] s/p partial nephrectomy- CT- [ Oct 2024- [facility]]; awaiting staghorn stone extraction.   # Chronic intermitent diarrhea 1-2 a day- ? Iron- off; recommend GI evaluation- [facility].  # vit D def- [Dr.Shah/PCP]-ok with vit D as per PCP   HCG Urine- IUD-declined  # DISPOSITION: # venofer today # follow up 3 month MD; labs- cbc/bmp; iron studies; ferritin;  possible venofer -Dr.B

## 2023-09-17 NOTE — Progress Notes (Unsigned)
Fatigue/weakness: YES Dyspena: NO Light headedness: NO  Blood in stool: NO  Has had a CT and chest xray at [facility] since last visit, post partial nephrectomy.  Has diarrhea, having 4-6 Bms per day, thinks it's from the metformin.  March 27th having a kidney stone removed.  Seeing pulmonology for possible copd/lung disease. Seeing Dr. Meredeth Ide at [facility].

## 2023-09-17 NOTE — Progress Notes (Unsigned)
[facility] CONSULT NOTE  Patient Care Team: Dale Crandall, MD as PCP - General (Internal Medicine) Earna Coder, MD as Consulting Physician (Oncology)  CHIEF COMPLAINTS/PURPOSE OF CONSULTATION: ANEMIA   HEMATOLOGY HISTORY  # ANEMIA[Hb; MCV-platelets- WBC; Iron sat; ferritin;  GFR- CT/US- ;  EGD/colonoscopy-   Latest Reference Range & Units 11/29/22 13:57  Iron 42 - 145 ug/dL 38 (L)  TIBC 213.0 - 865.7 mcg/dL 846.9 (H)  Saturation Ratios 20.0 - 50.0 % 8.4 (L)  Ferritin 10.0 - 291.0 ng/mL 8.5 (L)  Transferrin 212.0 - 360.0 mg/dL 629.5  (L): Data is abnormally low (H): Data is abnormally high  HISTORY OF PRESENTING ILLNESS: Patient ambulating-independently.  Alone/Accompanied by family.    Lindsey Strickland 48 y.o.  female pleasant patient staging kidney cancer and also history of heavy menstrual cycles/iron deficient anemia is here for follow-up.  Patient has had a CT and chest xray at [facility] since last visit, post partial nephrectomy.    Has diarrhea, having 4-6 Bms per day, thinks it's from the metformin.   March 27th having a kidney stone removed.   Review of Systems  Constitutional:  Positive for malaise/fatigue. Negative for chills, diaphoresis, fever and weight loss.  HENT:  Negative for nosebleeds and sore throat.   Eyes:  Negative for double vision.  Respiratory:  Positive for shortness of breath. Negative for cough, hemoptysis, sputum production and wheezing.   Cardiovascular:  Negative for chest pain, palpitations, orthopnea and leg swelling.  Gastrointestinal:  Negative for abdominal pain, blood in stool, constipation, diarrhea, heartburn, melena, nausea and vomiting.  Genitourinary:  Negative for dysuria, frequency and urgency.  Musculoskeletal:  Negative for back pain and joint pain.  Skin: Negative.  Negative for itching and rash.  Neurological:  Positive for dizziness. Negative for tingling, focal weakness, weakness and headaches.   Endo/Heme/Allergies:  Does not bruise/bleed easily.  Psychiatric/Behavioral:  Negative for depression. The patient is not nervous/anxious and does not have insomnia.      MEDICAL HISTORY:  Past Medical History:  Diagnosis Date   Abnormal bleeding in menstrual cycle    Anemia    Atypical endometrial hyperplasia    Cancer (HCC)    Clear cell carcinoma of kidney (HCC)    Diabetes mellitus (HCC)    Diarrhea    Environmental allergies    GAD (generalized anxiety disorder)    Generalized anxiety disorder    GERD (gastroesophageal reflux disease)    Hypercholesterolemia    Hypertension    Insomnia    Leukocytosis    Major depressive disorder    Palpitations    Symptomatic anemia    Thrombocytosis    Vitamin D deficiency     SURGICAL HISTORY: Past Surgical History:  Procedure Laterality Date   COLONOSCOPY     COLONOSCOPY WITH PROPOFOL N/A 05/28/2018   Procedure: COLONOSCOPY WITH PROPOFOL;  Surgeon: Scot Jun, MD;  Location: [facility] ENDOSCOPY;  Service: Endoscopy;  Laterality: N/A;   ESOPHAGOGASTRODUODENOSCOPY (EGD) WITH PROPOFOL N/A 05/28/2018   Procedure: ESOPHAGOGASTRODUODENOSCOPY (EGD) WITH PROPOFOL;  Surgeon: Scot Jun, MD;  Location: [facility] ENDOSCOPY;  Service: Endoscopy;  Laterality: N/A;   HYSTEROSCOPY WITH D & C     and polyp removal    SOCIAL HISTORY: Social History   Socioeconomic History   Marital status: Single    Spouse name: Not on file   Number of children: 2   Years of education: Not on file   Highest education level: High school graduate  Occupational  History    Employer: [employer]  Tobacco Use   Smoking status: Former    Current packs/day: 0.00    Types: Cigarettes    Quit date: 08/06/2000    Years since quitting: 23.1   Smokeless tobacco: Never  Vaping Use   Vaping status: Never Used  Substance and Sexual Activity   Alcohol use: Yes    Alcohol/week: 0.0 standard drinks of alcohol    Comment: rare   Drug use: No   Sexual  activity: Yes    Birth control/protection: I.U.D.  Other Topics Concern   Not on file  Social History Narrative   Not on file   Social Drivers of Health   Financial Resource Strain: Low Risk  (07/26/2023)   Received from [facility]   Overall Financial Resource Strain (CARDIA)    Difficulty of Paying Living Expenses: Not hard at all  Food Insecurity: No Food Insecurity (07/26/2023)   Received from [facility]   Hunger Vital Sign    Worried About Running Out of Food in the Last Year: Never true    Ran Out of Food in the Last Year: Never true  Transportation Needs: No Transportation Needs (07/26/2023)   Received from [facility] - Transportation    In the past 12 months, has lack of transportation kept you from medical appointments or from getting medications?: No    Lack of Transportation (Non-Medical): No  Physical Activity: Unknown (11/29/2022)   Exercise Vital Sign    Days of Exercise per Week: Patient declined    Minutes of Exercise per Session: Not on file  Stress: Stress Concern Present (11/29/2022)   [employer] of Occupational Health - Occupational Stress Questionnaire    Feeling of Stress : Rather much  Social Connections: Unknown (11/29/2022)   Social Connection and Isolation Panel [NHANES]    Frequency of Communication with Friends and Family: Patient declined    Frequency of Social Gatherings with Friends and Family: Patient declined    Attends Religious Services: Patient declined    Database administrator or Organizations: Patient declined    Attends Banker Meetings: Not on file    Marital Status: Patient declined  Intimate Partner Violence: Not At Risk (01/21/2023)   Humiliation, Afraid, Rape, and Kick questionnaire    Fear of Current or Ex-Partner: No    Emotionally Abused: No    Physically Abused: No    Sexually Abused: No    FAMILY HISTORY: Family History  Problem Relation  Age of Onset   Hypertension Mother    Diabetes Mother    Diverticulosis Mother    Depression Mother    Hypercholesterolemia Father    Hypertension Father    Cancer Father    Heart disease Maternal Grandfather    Diverticulosis Paternal Grandmother    Hyperlipidemia Paternal Grandmother    Heart failure Paternal Grandmother    Cancer Maternal Grandmother    Alcoholism Paternal Uncle     ALLERGIES:  is allergic to tetracycline, augmentin [amoxicillin-pot clavulanate], hctz [hydrochlorothiazide], lisinopril, other, prednisone, and ciprofloxacin.  MEDICATIONS:  Current Outpatient Medications  Medication Sig Dispense Refill   albuterol (VENTOLIN HFA) 108 (90 Base) MCG/ACT inhaler Inhale 2 puffs into the lungs every 6 (six) hours as needed for wheezing. Patient request ProAir brand only. 18 g 2   amLODipine (NORVASC) 10 MG tablet Take 1/2 (one-half) tablet by mouth twice daily 90 tablet 2   Cholecalciferol (VITAMIN D3)  50 MCG (2000 UT) capsule Take 2,000 Units by mouth daily.     Cranberry (RA CRANBERRY) 500 MG CAPS Take 500 mg by mouth daily.     D-Mannose 500 MG CAPS Take 500 mg by mouth in the morning and at bedtime.     estradiol (ESTRACE) 0.1 MG/GM vaginal cream Place vaginally.     famotidine (PEPCID) 20 MG tablet TAKE 2 TABLETS (40 MG) BY MOUTH NIGHTLY     fluticasone (FLONASE) 50 MCG/ACT nasal spray Place 1 spray into both nostrils daily. 48 g 1   glucose blood test strip Use as directed to check sugars BID. Dx E11.9 100 each 12   Lancets MISC by Does not apply route. Use 1 lancets three times a day     levonorgestrel (MIRENA) 20 MCG/DAY IUD by Intrauterine route.     loratadine (CLARITIN) 10 MG tablet Take 10 mg by mouth daily.     losartan (COZAAR) 100 MG tablet Take 1 tablet (100 mg total) by mouth daily. 90 tablet 2   metFORMIN (GLUCOPHAGE) 500 MG tablet Take 2 tablets (1,000 mg total) by mouth 2 (two) times daily. 360 tablet 1   metoprolol succinate (TOPROL-XL) 100 MG 24 hr  tablet Take 0.5 tablets (50 mg total) by mouth 2 (two) times daily. Take with or immediately following a meal. 90 tablet 2   pravastatin (PRAVACHOL) 40 MG tablet Take 1 tablet (40 mg total) by mouth daily. 90 tablet 1   Probiotic Product (FEM-DOPHILUS WOMENS PO) Take 1 tablet by mouth daily.     Vilazodone HCl (VIIBRYD) 10 MG TABS Take 5 mg by mouth daily.     No current facility-administered medications for this visit.     PHYSICAL EXAMINATION:   Vitals:   09/17/23 1506  BP: 133/69  Pulse: (!) 104  Temp: 100.1 F (37.8 C)  SpO2: 100%    Filed Weights   09/17/23 1506  Weight: (!) 309 lb 9.6 oz (140.4 kg)     Physical Exam Vitals and nursing note reviewed.  HENT:     Head: Normocephalic and atraumatic.     Mouth/Throat:     Pharynx: Oropharynx is clear.  Eyes:     Extraocular Movements: Extraocular movements intact.     Pupils: Pupils are equal, round, and reactive to light.  Cardiovascular:     Rate and Rhythm: Normal rate and regular rhythm.  Pulmonary:     Comments: Decreased breath sounds bilaterally.  Abdominal:     Palpations: Abdomen is soft.  Musculoskeletal:        General: Normal range of motion.     Cervical back: Normal range of motion.  Skin:    General: Skin is warm.  Neurological:     General: No focal deficit present.     Mental Status: She is alert and oriented to person, place, and time.  Psychiatric:        Behavior: Behavior normal.        Judgment: Judgment normal.      LABORATORY DATA:  I have reviewed the data as listed Lab Results  Component Value Date   WBC 11.5 (H) 09/17/2023   HGB 12.1 09/17/2023   HCT 38.2 09/17/2023   MCV 89.3 09/17/2023   PLT 397 09/17/2023   Recent Labs    11/29/22 1357 11/29/22 1357 03/05/23 1500 06/18/23 1538 07/25/23 1029 07/26/23 1203 09/17/23 1451  NA 138  --  138 137  --  140 135  K 4.8  --  4.5 4.5  --  4.3 4.1  CL 101  --  103 99  --  103 101  CO2 24  --  24 24  --  23 23  GLUCOSE  110*  --  128* 129*  --  165* 117*  BUN 17  --  17 12  --  15 17  CREATININE 0.73   < > 0.61 0.70  --  0.63 0.56  CALCIUM 9.7  --  9.7 9.4  --  9.5 9.1  GFRNONAA  --   --  >60 >60  --   --  >60  PROT 7.2  --   --   --  7.0  --   --   ALBUMIN 4.3  --   --   --  4.2  --   --   AST 12  --   --   --  11  --   --   ALT 14  --   --   --  15  --   --   ALKPHOS 69  --   --   --  68  --   --   BILITOT 0.3  --   --   --  0.3  --   --   BILIDIR 0.1  --   --   --  0.0  --   --    < > = values in this interval not displayed.     No results found.  ASSESSMENT & PLAN:   Symptomatic anemia # Anemia- Hb- 10.5; Ferritin- 8.5 [PCP- APRIL 2024] symptomatic.  Likely due to iron deficiency -menorrhagia.  Poor tolerance/lack of improvement on oral iron.    # Today hemoglobin 12.2-  Proceed with infusion.   # Etiology of iron deficiency: s/p IUD-Heavy  Menstrual cycles- Amy Bryant; GYN [[facility]]- colo 2019- internal bleed [[facility]]   # Hx of right RCC [incidental- Dr.raynor- [facility].Duet ] s/p partial nephrectomy- CT- [ Oct 2024- [facility]]; awaiting staghorn stone extraction.   # Chronic intermitent diarrhea 1-2 a day- ? Iron- off; recommend GI evaluation- [facility].  # vit D def- [Dr.Shah/PCP]-ok with vit D as per PCP   HCG Urine- IUD-declined  # DISPOSITION: # venofer today # follow up 3 month MD; labs- cbc/bmp; iron studies; ferritin;  possible venofer -Dr.B   All questions were answered. The patient knows to call the clinic with any problems, questions or concerns.    Earna Coder, MD 09/17/2023 3:42 PM

## 2023-09-18 ENCOUNTER — Encounter: Payer: Self-pay | Admitting: Internal Medicine

## 2023-09-19 ENCOUNTER — Encounter: Payer: Self-pay | Admitting: Gastroenterology

## 2023-09-27 ENCOUNTER — Encounter: Payer: Self-pay | Admitting: Gastroenterology

## 2023-09-30 ENCOUNTER — Encounter: Payer: Self-pay | Admitting: Gastroenterology

## 2023-09-30 ENCOUNTER — Encounter
Admission: RE | Disposition: A | Discharge status: Home / Self Care | Payer: Self-pay | Source: Home / Self Care | Attending: Gastroenterology

## 2023-09-30 ENCOUNTER — Ambulatory Visit: Payer: 59 | Admitting: Anesthesiology

## 2023-09-30 ENCOUNTER — Ambulatory Visit
Admission: RE | Admit: 2023-09-30 | Discharge: 2023-09-30 | Disposition: A | Discharge status: Home / Self Care | Payer: 59 | Attending: Gastroenterology | Admitting: Gastroenterology

## 2023-09-30 ENCOUNTER — Other Ambulatory Visit: Payer: Self-pay

## 2023-09-30 ENCOUNTER — Other Ambulatory Visit: Payer: Self-pay | Admitting: Gastroenterology

## 2023-09-30 DIAGNOSIS — Z7984 Long term (current) use of oral hypoglycemic drugs: Secondary | ICD-10-CM | POA: Insufficient documentation

## 2023-09-30 DIAGNOSIS — K5730 Diverticulosis of large intestine without perforation or abscess without bleeding: Secondary | ICD-10-CM | POA: Diagnosis not present

## 2023-09-30 DIAGNOSIS — D122 Benign neoplasm of ascending colon: Secondary | ICD-10-CM | POA: Insufficient documentation

## 2023-09-30 DIAGNOSIS — Z905 Acquired absence of kidney: Secondary | ICD-10-CM | POA: Diagnosis not present

## 2023-09-30 DIAGNOSIS — I10 Essential (primary) hypertension: Secondary | ICD-10-CM | POA: Diagnosis not present

## 2023-09-30 DIAGNOSIS — K2289 Other specified disease of esophagus: Secondary | ICD-10-CM | POA: Diagnosis not present

## 2023-09-30 DIAGNOSIS — E119 Type 2 diabetes mellitus without complications: Secondary | ICD-10-CM | POA: Insufficient documentation

## 2023-09-30 DIAGNOSIS — R197 Diarrhea, unspecified: Secondary | ICD-10-CM | POA: Diagnosis present

## 2023-09-30 DIAGNOSIS — K640 First degree hemorrhoids: Secondary | ICD-10-CM | POA: Insufficient documentation

## 2023-09-30 DIAGNOSIS — D509 Iron deficiency anemia, unspecified: Secondary | ICD-10-CM | POA: Diagnosis not present

## 2023-09-30 DIAGNOSIS — K219 Gastro-esophageal reflux disease without esophagitis: Secondary | ICD-10-CM | POA: Diagnosis not present

## 2023-09-30 DIAGNOSIS — K317 Polyp of stomach and duodenum: Secondary | ICD-10-CM | POA: Insufficient documentation

## 2023-09-30 DIAGNOSIS — Z87891 Personal history of nicotine dependence: Secondary | ICD-10-CM | POA: Insufficient documentation

## 2023-09-30 DIAGNOSIS — K529 Noninfective gastroenteritis and colitis, unspecified: Secondary | ICD-10-CM | POA: Diagnosis not present

## 2023-09-30 HISTORY — DX: Vitamin D deficiency, unspecified: E55.9

## 2023-09-30 HISTORY — DX: Type 2 diabetes mellitus without complications: E11.9

## 2023-09-30 HISTORY — PX: COLONOSCOPY WITH PROPOFOL: SHX5780

## 2023-09-30 HISTORY — DX: Malignant (primary) neoplasm, unspecified: C80.1

## 2023-09-30 HISTORY — DX: Palpitations: R00.2

## 2023-09-30 HISTORY — DX: Generalized anxiety disorder: F41.1

## 2023-09-30 HISTORY — DX: Thrombocytosis, unspecified: D75.839

## 2023-09-30 HISTORY — DX: Iron deficiency anemia, unspecified: D50.9

## 2023-09-30 HISTORY — DX: Anesthesia of skin: R20.2

## 2023-09-30 HISTORY — DX: Elevated white blood cell count, unspecified: D72.829

## 2023-09-30 HISTORY — DX: Benign endometrial hyperplasia: N85.01

## 2023-09-30 HISTORY — PX: ESOPHAGOGASTRODUODENOSCOPY (EGD) WITH PROPOFOL: SHX5813

## 2023-09-30 HISTORY — PX: POLYPECTOMY: SHX5525

## 2023-09-30 HISTORY — DX: Irregular menstruation, unspecified: N92.6

## 2023-09-30 HISTORY — DX: Malignant neoplasm of unspecified kidney, except renal pelvis: C64.9

## 2023-09-30 HISTORY — DX: Anesthesia of skin: R20.0

## 2023-09-30 HISTORY — DX: Other specified disorders of kidney and ureter: N28.89

## 2023-09-30 HISTORY — DX: Anemia, unspecified: D64.9

## 2023-09-30 HISTORY — PX: BIOPSY: SHX5522

## 2023-09-30 LAB — GLUCOSE, CAPILLARY: Glucose-Capillary: 183 mg/dL — ABNORMAL HIGH (ref 70–99)

## 2023-09-30 SURGERY — COLONOSCOPY WITH PROPOFOL
Anesthesia: General

## 2023-09-30 MED ORDER — PROPOFOL 500 MG/50ML IV EMUL
INTRAVENOUS | Status: DC | PRN
  Administered 2023-09-30: 145 ug/kg/min via INTRAVENOUS

## 2023-09-30 MED ORDER — PROPOFOL 10 MG/ML IV BOLUS
INTRAVENOUS | Status: DC | PRN
  Administered 2023-09-30: 60 mg via INTRAVENOUS

## 2023-09-30 MED ORDER — SODIUM CHLORIDE 0.9 % IV SOLN: INTRAVENOUS | Status: DC

## 2023-09-30 MED ORDER — PROPOFOL 1000 MG/100ML IV EMUL
INTRAVENOUS | Status: AC
  Filled 2023-09-30: qty 600

## 2023-09-30 MED ORDER — GLYCOPYRROLATE 0.2 MG/ML IJ SOLN
INTRAMUSCULAR | Status: DC | PRN
  Administered 2023-09-30: .2 mg via INTRAVENOUS

## 2023-09-30 MED ORDER — MIDAZOLAM HCL 2 MG/2ML IJ SOLN
INTRAMUSCULAR | Status: AC
  Filled 2023-09-30: qty 2

## 2023-09-30 MED ORDER — MIDAZOLAM HCL 2 MG/2ML IJ SOLN
INTRAMUSCULAR | Status: DC | PRN
  Administered 2023-09-30: 2 mg via INTRAVENOUS

## 2023-09-30 MED ORDER — LIDOCAINE HCL (CARDIAC) PF 100 MG/5ML IV SOSY
PREFILLED_SYRINGE | INTRAVENOUS | Status: DC | PRN
  Administered 2023-09-30: 100 mg via INTRAVENOUS

## 2023-09-30 NOTE — Op Note (Signed)
 [facility] Gastroenterology Patient Name: Lindsey Strickland Procedure Date: 09/30/2023 7:18 AM MRN: [id number] Account #: [id number] Date of Birth: 1976/03/01 Admit Type: Outpatient Age: 48 Room: [facility] ENDO ROOM 3 Gender: Female Note Status: Supervisor Override Instrument Name: Prentice Docker [id number] Procedure:             Colonoscopy Indications:           Chronic diarrhea, Iron deficiency anemia Providers:             Eather Colas MD, MD Referring MD:          Dale Lopatcong Overlook, MD (Referring MD) Medicines:             Monitored Anesthesia Care Complications:         No immediate complications. Estimated blood loss:                         Minimal. Procedure:             Pre-Anesthesia Assessment:                        - Prior to the procedure, a History and Physical was                         performed, and patient medications and allergies were                         reviewed. The patient is competent. The risks and                         benefits of the procedure and the sedation options and                         risks were discussed with the patient. All questions                         were answered and informed consent was obtained.                         Patient identification and proposed procedure were                         verified by the physician, the nurse, the                         anesthesiologist, the anesthetist and the technician                         in the endoscopy suite. Mental Status Examination:                         alert and oriented. Airway Examination: normal                         oropharyngeal airway and neck mobility. Respiratory                         Examination: clear to auscultation. CV Examination:  normal. Prophylactic Antibiotics: The patient does not                         require prophylactic antibiotics. Prior                         Anticoagulants: The patient has taken no  anticoagulant                         or antiplatelet agents. ASA Grade Assessment: III - A                         patient with severe systemic disease. After reviewing                         the risks and benefits, the patient was deemed in                         satisfactory condition to undergo the procedure. The                         anesthesia plan was to use monitored anesthesia care                         (MAC). Immediately prior to administration of                         medications, the patient was re-assessed for adequacy                         to receive sedatives. The heart rate, respiratory                         rate, oxygen saturations, blood pressure, adequacy of                         pulmonary ventilation, and response to care were                         monitored throughout the procedure. The physical                         status of the patient was re-assessed after the                         procedure.                        After obtaining informed consent, the colonoscope was                         passed under direct vision. Throughout the procedure,                         the patient's blood pressure, pulse, and oxygen                         saturations were monitored continuously. The  Colonoscope was introduced through the anus and                         advanced to the the cecum, identified by appendiceal                         orifice and ileocecal valve. The colonoscopy was                         technically difficult and complex due to significant                         looping and the patient's body habitus. Successful                         completion of the procedure was aided by applying                         abdominal pressure. The patient tolerated the                         procedure well. The quality of the bowel preparation                         was good. The ileocecal valve, appendiceal orifice,                          and rectum were photographed. Findings:      The perianal and digital rectal examinations were normal.      Normal mucosa was found in the entire colon. Biopsies for histology were       taken with a cold forceps from the entire colon for evaluation of       microscopic colitis. Estimated blood loss was minimal.      A single large-mouthed diverticulum was found in the ascending colon.      A 2 mm polyp was found in the ascending colon. The polyp was sessile.       The polyp was removed with a jumbo cold forceps. Resection and retrieval       were complete. Estimated blood loss was minimal.      A 4 mm polyp was found in the ascending colon. The polyp was sessile.       The polyp was removed with a cold snare. Resection and retrieval were       complete. Estimated blood loss was minimal.      Internal hemorrhoids were found during retroflexion. The hemorrhoids       were Grade I (internal hemorrhoids that do not prolapse).      The exam was otherwise without abnormality on direct and retroflexion       views. Impression:            - Normal mucosa in the entire examined colon. Biopsied.                        - Diverticulosis in the ascending colon.                        - One 2 mm polyp in the ascending colon, removed with  a jumbo cold forceps. Resected and retrieved.                        - One 4 mm polyp in the ascending colon, removed with                         a cold snare. Resected and retrieved.                        - Internal hemorrhoids.                        - The examination was otherwise normal on direct and                         retroflexion views. Recommendation:        - Discharge patient to home.                        - Resume previous diet.                        - Continue present medications.                        - Await pathology results.                        - Repeat colonoscopy for surveillance based on                          pathology results.                        - Return to referring physician as previously                         scheduled. A brief look in the TI was made but due to                         looping, was unable to be adequately examined. Given                         metformin as the likely cause of her diarrhea, would                         recommend holding. If diarrhea persists can consider                         CTE to evaluate the small bowel. Procedure Code(s):     --- Professional ---                        [phone], Colonoscopy, flexible; with removal of                         tumor(s), polyp(s), or other lesion(s) by snare                         technique  52841, 59, Colonoscopy, flexible; with biopsy, single                         or multiple Diagnosis Code(s):     --- Professional ---                        K64.0, First degree hemorrhoids                        D12.2, Benign neoplasm of ascending colon                        K52.9, Noninfective gastroenteritis and colitis,                         unspecified                        K57.30, Diverticulosis of large intestine without                         perforation or abscess without bleeding CPT copyright 2022 American Medical Association. All rights reserved. The codes documented in this report are preliminary and upon coder review may  be revised to meet current compliance requirements. Eather Colas MD, MD 09/30/2023 8:31:15 AM Number of Addenda: 0 Note Initiated On: 09/30/2023 7:18 AM Scope Withdrawal Time: 0 hours 10 minutes 1 second  Total Procedure Duration: 0 hours 18 minutes 58 seconds  Estimated Blood Loss:  Estimated blood loss was minimal.      [facility]

## 2023-09-30 NOTE — Anesthesia Postprocedure Evaluation (Signed)
 Anesthesia Post Note  Patient: Lindsey Strickland  Procedure(s) Performed: COLONOSCOPY WITH PROPOFOL ESOPHAGOGASTRODUODENOSCOPY (EGD) WITH PROPOFOL POLYPECTOMY BIOPSY  Patient location during evaluation: PACU Anesthesia Type: General Level of consciousness: awake and alert Pain management: pain level controlled Vital Signs Assessment: post-procedure vital signs reviewed and stable Respiratory status: spontaneous breathing, nonlabored ventilation, respiratory function stable and patient connected to nasal cannula oxygen Cardiovascular status: blood pressure returned to baseline and stable Postop Assessment: no apparent nausea or vomiting Anesthetic complications: no   No notable events documented.   Last Vitals:  Vitals:   09/30/23 0721 09/30/23 0823  BP: (!) 157/77 (!) 102/50  Pulse: (!) 111 75  Resp: 18 12  Temp: 36.7 C (!) 36.1 C  SpO2: 100% 98%    Last Pain:  Vitals:   09/30/23 0843  TempSrc:   PainSc: 0-No pain                 Yevette Edwards

## 2023-09-30 NOTE — Anesthesia Preprocedure Evaluation (Signed)
 Anesthesia Evaluation  Patient identified by MRN, date of birth, ID band Patient awake    Reviewed: Allergy & Precautions, H&P , NPO status , Patient's Chart, lab work & pertinent test results, reviewed documented beta blocker date and time   Airway Mallampati: III   Neck ROM: full    Dental  (+) Teeth Intact   Pulmonary neg pulmonary ROS, former smoker   Pulmonary exam normal        Cardiovascular Exercise Tolerance: Good hypertension, On Medications negative cardio ROS Normal cardiovascular exam Rhythm:regular Rate:Normal     Neuro/Psych  PSYCHIATRIC DISORDERS Anxiety Depression    negative neurological ROS     GI/Hepatic Neg liver ROS,GERD  Medicated,,  Endo/Other  diabetes, Well Controlled, Type 2, Oral Hypoglycemic Agents  Class 3 obesity  Renal/GU Renal disease  negative genitourinary   Musculoskeletal   Abdominal   Peds  Hematology  (+) Blood dyscrasia, anemia   Anesthesia Other Findings Past Medical History: No date: Abnormal bleeding in menstrual cycle No date: Anemia No date: Atypical endometrial hyperplasia No date: Cancer (HCC) No date: Clear cell carcinoma of kidney (HCC) No date: Diabetes mellitus (HCC) No date: Diarrhea No date: Endometrial hyperplasia without atypia, complex No date: Environmental allergies No date: GAD (generalized anxiety disorder) No date: Generalized anxiety disorder No date: GERD (gastroesophageal reflux disease) No date: Hypercholesterolemia No date: Hypertension No date: Insomnia No date: Iron deficiency anemia No date: Leukocytosis No date: Major depressive disorder No date: Numbness and tingling No date: Palpitations No date: Renal mass No date: Symptomatic anemia No date: Thrombocytosis No date: Type 2 diabetes mellitus without complications (HCC) No date: Vitamin D deficiency Past Surgical History: No date: COLONOSCOPY 05/28/2018: COLONOSCOPY WITH  PROPOFOL; N/A     Comment:  Procedure: COLONOSCOPY WITH PROPOFOL;  Surgeon: Scot Jun, MD;  Location: [facility] ENDOSCOPY;  Service:               Endoscopy;  Laterality: N/A; No date: DILATION AND CURETTAGE OF UTERUS 05/28/2018: ESOPHAGOGASTRODUODENOSCOPY (EGD) WITH PROPOFOL; N/A     Comment:  Procedure: ESOPHAGOGASTRODUODENOSCOPY (EGD) WITH               PROPOFOL;  Surgeon: Scot Jun, MD;  Location:               [facility] ENDOSCOPY;  Service: Endoscopy;  Laterality: N/A; No date: HYSTEROSCOPY WITH D & C     Comment:  and polyp removal   Reproductive/Obstetrics negative OB ROS                             Anesthesia Physical Anesthesia Plan  ASA: 3  Anesthesia Plan: General   Post-op Pain Management:    Induction:   PONV Risk Score and Plan:   Airway Management Planned:   Additional Equipment:   Intra-op Plan:   Post-operative Plan:   Informed Consent: I have reviewed the patients History and Physical, chart, labs and discussed the procedure including the risks, benefits and alternatives for the proposed anesthesia with the patient or authorized representative who has indicated his/her understanding and acceptance.     Dental Advisory Given  Plan Discussed with: CRNA  Anesthesia Plan Comments:        Anesthesia Quick Evaluation

## 2023-09-30 NOTE — Op Note (Signed)
 [facility] Gastroenterology Patient Name: Lindsey Strickland Procedure Date: 09/30/2023 7:18 AM MRN: [id number] Account #: [id number] Date of Birth: 1976-01-19 Admit Type: Outpatient Age: 48 Room: [facility] ENDO ROOM 3 Gender: Female Note Status: Finalized Instrument Name: Upper Endoscope [id number] Procedure:             Upper GI endoscopy Indications:           Iron deficiency anemia Providers:             Eather Colas MD, MD Referring MD:          Dale Milford, MD (Referring MD) Medicines:             Monitored Anesthesia Care Complications:         No immediate complications. Procedure:             Pre-Anesthesia Assessment:                        - Prior to the procedure, a History and Physical was                         performed, and patient medications and allergies were                         reviewed. The patient is competent. The risks and                         benefits of the procedure and the sedation options and                         risks were discussed with the patient. All questions                         were answered and informed consent was obtained.                         Patient identification and proposed procedure were                         verified by the physician, the nurse, the                         anesthesiologist, the anesthetist and the technician                         in the endoscopy suite. Mental Status Examination:                         alert and oriented. Airway Examination: normal                         oropharyngeal airway and neck mobility. Respiratory                         Examination: clear to auscultation. CV Examination:                         normal. Prophylactic Antibiotics: The patient does not  require prophylactic antibiotics. Prior                         Anticoagulants: The patient has taken no anticoagulant                         or antiplatelet agents. ASA Grade  Assessment: III - A                         patient with severe systemic disease. After reviewing                         the risks and benefits, the patient was deemed in                         satisfactory condition to undergo the procedure. The                         anesthesia plan was to use monitored anesthesia care                         (MAC). Immediately prior to administration of                         medications, the patient was re-assessed for adequacy                         to receive sedatives. The heart rate, respiratory                         rate, oxygen saturations, blood pressure, adequacy of                         pulmonary ventilation, and response to care were                         monitored throughout the procedure. The physical                         status of the patient was re-assessed after the                         procedure.                        After obtaining informed consent, the endoscope was                         passed under direct vision. Throughout the procedure,                         the patient's blood pressure, pulse, and oxygen                         saturations were monitored continuously. The Endoscope                         was introduced through the mouth, and advanced to the  second part of duodenum. The upper GI endoscopy was                         accomplished without difficulty. The patient tolerated                         the procedure well. Findings:      The Z-line was variable.      The exam of the esophagus was otherwise normal.      A few small sessile fundic gland polyps with no bleeding and no stigmata       of recent bleeding were found in the gastric fundus and in the gastric       body.      The exam of the stomach was otherwise normal.      The examined duodenum was normal. Impression:            - Z-line variable.                        - A few fundic gland polyps.                         - Normal examined duodenum.                        - No specimens collected. Recommendation:        - Discharge patient to home.                        - Resume previous diet.                        - Continue present medications.                        - Return to referring physician as previously                         scheduled. Procedure Code(s):     --- Professional ---                        [phone], Esophagogastroduodenoscopy, flexible,                         transoral; diagnostic, including collection of                         specimen(s) by brushing or washing, when performed                         (separate procedure) Diagnosis Code(s):     --- Professional ---                        K22.89, Other specified disease of esophagus                        K31.7, Polyp of stomach and duodenum                        D50.9, Iron deficiency anemia, unspecified CPT copyright 2022 American Medical Association. All  rights reserved. The codes documented in this report are preliminary and upon coder review may  be revised to meet current compliance requirements. Eather Colas MD, MD 09/30/2023 8:26:23 AM Number of Addenda: 0 Note Initiated On: 09/30/2023 7:18 AM Estimated Blood Loss:  Estimated blood loss: none.      [facility]

## 2023-09-30 NOTE — H&P (Signed)
 Outpatient short stay form Pre-procedure 09/30/2023  Regis Bill, MD  Primary Physician: Dale , MD  Reason for visit:  IDA/Chronic diarrhea  History of present illness:    48 y/o lady with morbid obesity, hypertension, and DM II here for EGD/Colonoscopy for IDA/diarrhea. She suspects the diarrhea is due to metformin. No blood thinners. No family history of GI malignancies. History of robotic nephrectomy.    Current Facility-Administered Medications:    0.9 %  sodium chloride infusion, , Intravenous, Continuous, Jerritt Cardoza, Rossie Muskrat, MD, Last Rate: 20 mL/hr at 09/30/23 0739, New Bag at 09/30/23 0739  Medications Prior to Admission  Medication Sig Dispense Refill Last Dose/Taking   albuterol (VENTOLIN HFA) 108 (90 Base) MCG/ACT inhaler Inhale 2 puffs into the lungs every 6 (six) hours as needed for wheezing. Patient request ProAir brand only. 18 g 2 Past Month   amLODipine (NORVASC) 10 MG tablet Take 1/2 (one-half) tablet by mouth twice daily 90 tablet 2 09/30/2023 Morning   Cholecalciferol (VITAMIN D3) 50 MCG (2000 UT) capsule Take 2,000 Units by mouth daily.   09/29/2023   D-Mannose 500 MG CAPS Take 500 mg by mouth in the morning and at bedtime.   09/29/2023   famotidine (PEPCID) 20 MG tablet TAKE 2 TABLETS (40 MG) BY MOUTH NIGHTLY   09/29/2023   losartan (COZAAR) 100 MG tablet Take 1 tablet (100 mg total) by mouth daily. 90 tablet 2 09/29/2023   metoprolol succinate (TOPROL-XL) 100 MG 24 hr tablet Take 0.5 tablets (50 mg total) by mouth 2 (two) times daily. Take with or immediately following a meal. 90 tablet 2 09/30/2023 Morning   pravastatin (PRAVACHOL) 40 MG tablet Take 1 tablet (40 mg total) by mouth daily. 90 tablet 1 09/29/2023   Vilazodone HCl (VIIBRYD) 10 MG TABS Take 5 mg by mouth daily.   09/29/2023   Cranberry (RA CRANBERRY) 500 MG CAPS Take 500 mg by mouth daily.   09/26/2023   estradiol (ESTRACE) 0.1 MG/GM vaginal cream Place vaginally. (Patient not taking: Reported  on 09/30/2023)   Not Taking   fluticasone (FLONASE) 50 MCG/ACT nasal spray Place 1 spray into both nostrils daily. 48 g 1    glucose blood test strip Use as directed to check sugars BID. Dx E11.9 100 each 12    Lancets MISC by Does not apply route. Use 1 lancets three times a day      levonorgestrel (MIRENA) 20 MCG/DAY IUD by Intrauterine route.      loratadine (CLARITIN) 10 MG tablet Take 10 mg by mouth daily.      metFORMIN (GLUCOPHAGE) 500 MG tablet Take 2 tablets (1,000 mg total) by mouth 2 (two) times daily. 360 tablet 1 09/28/2023   Probiotic Product (FEM-DOPHILUS WOMENS PO) Take 1 tablet by mouth daily.        Allergies  Allergen Reactions   Tetracycline Other (See Comments)    Other reaction(s): Unknown    Augmentin [Amoxicillin-Pot Clavulanate]     GI intolerance    Hctz [Hydrochlorothiazide]    Lisinopril     Other reaction(s): Other (See Comments) cough   Other     Mycins   Prednisone     Chest pain   Ciprofloxacin Nausea Only and Palpitations     Past Medical History:  Diagnosis Date   Abnormal bleeding in menstrual cycle    Anemia    Atypical endometrial hyperplasia    Cancer (HCC)    Clear cell carcinoma of kidney (HCC)    Diabetes mellitus (  HCC)    Diarrhea    Endometrial hyperplasia without atypia, complex    Environmental allergies    GAD (generalized anxiety disorder)    Generalized anxiety disorder    GERD (gastroesophageal reflux disease)    Hypercholesterolemia    Hypertension    Insomnia    Iron deficiency anemia    Leukocytosis    Major depressive disorder    Numbness and tingling    Palpitations    Renal mass    Symptomatic anemia    Thrombocytosis    Type 2 diabetes mellitus without complications (HCC)    Vitamin D deficiency     Review of systems:  Otherwise negative.    Physical Exam  Gen: Alert, oriented. Appears stated age.  HEENT: PERRLA. Lungs: No respiratory distress CV: RRR Abd: soft, benign, no masses Ext: No  edema    Planned procedures: Proceed with EGD/colonoscopy. The patient understands the nature of the planned procedure, indications, risks, alternatives and potential complications including but not limited to bleeding, infection, perforation, damage to internal organs and possible oversedation/side effects from anesthesia. The patient agrees and gives consent to proceed.  Please refer to procedure notes for findings, recommendations and patient disposition/instructions.     Regis Bill, MD [facility] Gastroenterology

## 2023-09-30 NOTE — Anesthesia Procedure Notes (Signed)
 Procedure Name: General with mask airway Date/Time: 09/30/2023 7:53 AM  Performed by: Mohammed Kindle, CRNAPre-anesthesia Checklist: Patient identified, Emergency Drugs available, Suction available and Patient being monitored Patient Re-evaluated:Patient Re-evaluated prior to induction Oxygen Delivery Method: Simple face mask Induction Type: IV induction Placement Confirmation: positive ETCO2 and breath sounds checked- equal and bilateral Dental Injury: Teeth and Oropharynx as per pre-operative assessment

## 2023-09-30 NOTE — Interval H&P Note (Signed)
 History and Physical Interval Note:  09/30/2023 7:50 AM  Lindsey Strickland  has presented today for surgery, with the diagnosis of K52.9 (ICD-10-CM) - Chronic diarrhea D50.9 (ICD-10-CM) - Iron deficiency anemia, unspecified iron deficiency anemia type.  The various methods of treatment have been discussed with the patient and family. After consideration of risks, benefits and other options for treatment, the patient has consented to  Procedure(s): COLONOSCOPY WITH PROPOFOL (N/A) ESOPHAGOGASTRODUODENOSCOPY (EGD) WITH PROPOFOL (N/A) as a surgical intervention.  The patient's history has been reviewed, patient examined, no change in status, stable for surgery.  I have reviewed the patient's chart and labs.  Questions were answered to the patient's satisfaction.     Regis Bill  Ok to proceed with EGD/Colonoscopy

## 2023-09-30 NOTE — Transfer of Care (Signed)
 Immediate Anesthesia Transfer of Care Note  Patient: Lindsey Strickland  Procedure(s) Performed: COLONOSCOPY WITH PROPOFOL ESOPHAGOGASTRODUODENOSCOPY (EGD) WITH PROPOFOL POLYPECTOMY BIOPSY  Patient Location: Endoscopy Unit  Anesthesia Type:General  Level of Consciousness: drowsy and patient cooperative  Airway & Oxygen Therapy: Patient Spontanous Breathing and Patient connected to face mask oxygen  Post-op Assessment: Report given to RN and Post -op Vital signs reviewed and stable  Post vital signs: Reviewed and stable  Last Vitals:  Vitals Value Taken Time  BP 102/50 09/30/23 0825  Temp 36.1 C 09/30/23 0823  Pulse 68 09/30/23 0826  Resp 16 09/30/23 0826  SpO2 99 % 09/30/23 0826  Vitals shown include unfiled device data.  Last Pain:  Vitals:   09/30/23 0823  TempSrc: Temporal  PainSc: Asleep         Complications: No notable events documented.

## 2023-10-01 LAB — SURGICAL PATHOLOGY

## 2023-10-20 ENCOUNTER — Other Ambulatory Visit: Payer: Self-pay | Admitting: Internal Medicine

## 2023-10-25 ENCOUNTER — Encounter: Payer: Self-pay | Admitting: Internal Medicine

## 2023-10-25 ENCOUNTER — Telehealth: Payer: BC Managed Care – PPO | Admitting: Internal Medicine

## 2023-10-25 DIAGNOSIS — E7800 Pure hypercholesterolemia, unspecified: Secondary | ICD-10-CM | POA: Diagnosis not present

## 2023-10-25 DIAGNOSIS — D75839 Thrombocytosis, unspecified: Secondary | ICD-10-CM | POA: Diagnosis not present

## 2023-10-25 DIAGNOSIS — Z7984 Long term (current) use of oral hypoglycemic drugs: Secondary | ICD-10-CM

## 2023-10-25 DIAGNOSIS — D509 Iron deficiency anemia, unspecified: Secondary | ICD-10-CM

## 2023-10-25 DIAGNOSIS — E1165 Type 2 diabetes mellitus with hyperglycemia: Secondary | ICD-10-CM

## 2023-10-25 DIAGNOSIS — K219 Gastro-esophageal reflux disease without esophagitis: Secondary | ICD-10-CM

## 2023-10-25 DIAGNOSIS — I10 Essential (primary) hypertension: Secondary | ICD-10-CM

## 2023-10-25 DIAGNOSIS — C649 Malignant neoplasm of unspecified kidney, except renal pelvis: Secondary | ICD-10-CM

## 2023-10-25 DIAGNOSIS — F439 Reaction to severe stress, unspecified: Secondary | ICD-10-CM

## 2023-10-25 NOTE — Progress Notes (Deleted)
 Subjective:    Patient ID: Lindsey Strickland, female    DOB: 1976/04/26, 48 y.o.   MRN: [id number]  Patient here for a scheduled follow up.   HPI Here for a scheduled follow up - follow up regarding hypertension, diabetes and renal cell carcinoma. Had f/u with Dr Donneta Romberg 09/17/23 - hgb 12.2. iron infusion. Planning for percutaneous nephrolithotomy.    Past Medical History:  Diagnosis Date  . Abnormal bleeding in menstrual cycle   . Anemia   . Atypical endometrial hyperplasia   . Cancer (HCC)   . Clear cell carcinoma of kidney (HCC)   . Diabetes mellitus (HCC)   . Diarrhea   . Endometrial hyperplasia without atypia, complex   . Environmental allergies   . GAD (generalized anxiety disorder)   . Generalized anxiety disorder   . GERD (gastroesophageal reflux disease)   . Hypercholesterolemia   . Hypertension   . Insomnia   . Iron deficiency anemia   . Leukocytosis   . Major depressive disorder   . Numbness and tingling   . Palpitations   . Renal mass   . Symptomatic anemia   . Thrombocytosis   . Type 2 diabetes mellitus without complications (HCC)   . Vitamin D deficiency    Past Surgical History:  Procedure Laterality Date  . BIOPSY  09/30/2023   Procedure: BIOPSY;  Surgeon: Regis Bill, MD;  Location: [facility] ENDOSCOPY;  Service: Gastroenterology;;  . COLONOSCOPY    . COLONOSCOPY WITH PROPOFOL N/A 05/28/2018   Procedure: COLONOSCOPY WITH PROPOFOL;  Surgeon: Scot Jun, MD;  Location: [facility] ENDOSCOPY;  Service: Endoscopy;  Laterality: N/A;  . COLONOSCOPY WITH PROPOFOL N/A 09/30/2023   Procedure: COLONOSCOPY WITH PROPOFOL;  Surgeon: Regis Bill, MD;  Location: [facility] ENDOSCOPY;  Service: Gastroenterology;  Laterality: N/A;  . DILATION AND CURETTAGE OF UTERUS    . ESOPHAGOGASTRODUODENOSCOPY (EGD) WITH PROPOFOL N/A 05/28/2018   Procedure: ESOPHAGOGASTRODUODENOSCOPY (EGD) WITH PROPOFOL;  Surgeon: Scot Jun, MD;  Location: [facility] ENDOSCOPY;  Service:  Endoscopy;  Laterality: N/A;  . ESOPHAGOGASTRODUODENOSCOPY (EGD) WITH PROPOFOL N/A 09/30/2023   Procedure: ESOPHAGOGASTRODUODENOSCOPY (EGD) WITH PROPOFOL;  Surgeon: Regis Bill, MD;  Location: [facility] ENDOSCOPY;  Service: Gastroenterology;  Laterality: N/A;  . HYSTEROSCOPY WITH D & C     and polyp removal  . POLYPECTOMY  09/30/2023   Procedure: POLYPECTOMY;  Surgeon: Regis Bill, MD;  Location: [facility] ENDOSCOPY;  Service: Gastroenterology;;   Family History  Problem Relation Age of Onset  . Hypertension Mother   . Diabetes Mother   . Diverticulosis Mother   . Depression Mother   . Hypercholesterolemia Father   . Hypertension Father   . Cancer Father   . Heart disease Maternal Grandfather   . Diverticulosis Paternal Grandmother   . Hyperlipidemia Paternal Grandmother   . Heart failure Paternal Grandmother   . Cancer Maternal Grandmother   . Alcoholism Paternal Uncle    Social History   Socioeconomic History  . Marital status: Single    Spouse name: Not on file  . Number of children: 2  . Years of education: Not on file  . Highest education level: High school graduate  Occupational History    Employer: [employer]  Tobacco Use  . Smoking status: Former    Current packs/day: 0.00    Types: Cigarettes    Quit date: 08/06/2000    Years since quitting: 23.2  . Smokeless tobacco: Never  Vaping Use  . Vaping status: Never Used  Substance and Sexual Activity  . Alcohol use: Not Currently    Comment: rare  . Drug use: No  . Sexual activity: Yes    Birth control/protection: I.U.D.  Other Topics Concern  . Not on file  Social History Narrative  . Not on file   Social Drivers of Health   Financial Resource Strain: Low Risk  (10/25/2023)   Overall Financial Resource Strain (CARDIA)   . Difficulty of Paying Living Expenses: Not hard at all  Food Insecurity: No Food Insecurity (10/25/2023)   Hunger Vital Sign   . Worried About Programme researcher, broadcasting/film/video in the Last  Year: Never true   . Ran Out of Food in the Last Year: Never true  Transportation Needs: No Transportation Needs (10/25/2023)   PRAPARE - Transportation   . Lack of Transportation (Medical): No   . Lack of Transportation (Non-Medical): No  Physical Activity: Insufficiently Active (10/25/2023)   Exercise Vital Sign   . Days of Exercise per Week: 1 day   . Minutes of Exercise per Session: 10 min  Stress: Stress Concern Present (10/25/2023)   [employer] of Occupational Health - Occupational Stress Questionnaire   . Feeling of Stress : Very much  Social Connections: Socially Isolated (10/25/2023)   Social Connection and Isolation Panel [NHANES]   . Frequency of Communication with Friends and Family: Once a week   . Frequency of Social Gatherings with Friends and Family: Never   . Attends Religious Services: Never   . Active Member of Clubs or Organizations: No   . Attends Banker Meetings: Not on file   . Marital Status: Never married     Review of Systems     Objective:     There were no vitals taken for this visit. Wt Readings from Last 3 Encounters:  09/30/23 (!) 302 lb 6.4 oz (137.2 kg)  09/17/23 (!) 309 lb 9.6 oz (140.4 kg)  09/12/23 (!) 307 lb (139.3 kg)    Physical Exam  {Perform Simple Foot Exam  Perform Detailed exam:1} {Insert foot Exam (Optional):30965}   Outpatient Encounter Medications as of 10/25/2023  Medication Sig  . cefdinir (OMNICEF) 300 MG capsule Take 300 mg by mouth 2 (two) times daily.  Marland Kitchen albuterol (VENTOLIN HFA) 108 (90 Base) MCG/ACT inhaler Inhale 2 puffs into the lungs every 6 (six) hours as needed for wheezing. Patient request ProAir brand only.  Marland Kitchen amLODipine (NORVASC) 10 MG tablet Take 1/2 (one-half) tablet by mouth twice daily  . Cholecalciferol (VITAMIN D3) 50 MCG (2000 UT) capsule Take 2,000 Units by mouth daily.  . Cranberry (RA CRANBERRY) 500 MG CAPS Take 500 mg by mouth daily.  . D-Mannose 500 MG CAPS Take 500 mg by mouth  in the morning and at bedtime.  Marland Kitchen estradiol (ESTRACE) 0.1 MG/GM vaginal cream Place vaginally. (Patient not taking: Reported on 09/30/2023)  . famotidine (PEPCID) 20 MG tablet TAKE 2 TABLETS (40 MG) BY MOUTH NIGHTLY  . fluticasone (FLONASE) 50 MCG/ACT nasal spray Place 1 spray into both nostrils daily.  Marland Kitchen glucose blood test strip Use as directed to check sugars BID. Dx E11.9  . Lancets MISC by Does not apply route. Use 1 lancets three times a day  . levonorgestrel (MIRENA) 20 MCG/DAY IUD by Intrauterine route.  . loratadine (CLARITIN) 10 MG tablet Take 10 mg by mouth daily.  Marland Kitchen losartan (COZAAR) 100 MG tablet Take 1 tablet (100 mg total) by mouth daily.  . metFORMIN (GLUCOPHAGE) 500 MG tablet Take 2  tablets (1,000 mg total) by mouth 2 (two) times daily.  . metoprolol succinate (TOPROL-XL) 100 MG 24 hr tablet Take 0.5 tablets (50 mg total) by mouth 2 (two) times daily. Take with or immediately following a meal.  . pravastatin (PRAVACHOL) 40 MG tablet Take 1 tablet by mouth once daily  . Probiotic Product (FEM-DOPHILUS WOMENS PO) Take 1 tablet by mouth daily.  . Vilazodone HCl (VIIBRYD) 10 MG TABS Take 5 mg by mouth daily.   No facility-administered encounter medications on file as of 10/25/2023.     Lab Results  Component Value Date   WBC 11.5 (H) 09/17/2023   HGB 12.1 09/17/2023   HCT 38.2 09/17/2023   PLT 397 09/17/2023   GLUCOSE 117 (H) 09/17/2023   CHOL 177 07/25/2023   TRIG 189.0 (H) 07/25/2023   HDL 43.00 07/25/2023   LDLDIRECT 132.0 11/02/2020   LDLCALC 96 07/25/2023   ALT 15 07/25/2023   AST 11 07/25/2023   NA 135 09/17/2023   K 4.1 09/17/2023   CL 101 09/17/2023   CREATININE 0.56 09/17/2023   BUN 17 09/17/2023   CO2 23 09/17/2023   TSH 3.55 07/25/2023   INR 0.93 05/05/2018   HGBA1C 8.1 (H) 07/25/2023   MICROALBUR 45.0 (H) 07/25/2023    No results found.     Assessment & Plan:  Type 2 diabetes mellitus with hyperglycemia, without long-term current use of insulin  (HCC)  Thrombocytosis (HCC)  Hypercholesterolemia  Primary hypertension     Dale Yale, MD

## 2023-10-25 NOTE — Progress Notes (Addendum)
 Patient ID: Lindsey Strickland, female   DOB: Aug 31, 1975, 48 y.o.   MRN: [id number]   Virtual Visit via video Note  I connected with Rozelia Catapano by a video enabled telemedicine application and verified that I am speaking with the correct person using two identifiers. Location patient: home Location provider: work Persons participating in the virtual visit: patient, provider  The limitations, risks, security and privacy concerns of performing an evaluation and management service by video and the availability of in person appointments have been discussed. It has also been discussed with the patient that there may be a patient responsible charge related to this service. The patient expressed understanding and agreed to proceed.   Reason for visit: follow up appt  HPI: Scheduled follow up - follow up regarding hypertension, diabetes and renal cell carcinoma. Had f/u with Dr Donneta Romberg 09/17/23 - hgb 12.2. iron infusion. Planning for percutaneous nephrolithotomy. Has met with anesthesiology. Discussed medications and pre op planning.  Breathing stable. No chest pain reported. Persistent diarrhea. Has been worked up previously. Discussed metformin. Have discussed changing metformin. She states she is ready to change - after her surgery. Desires not to change now. Discussed treatment options.    ROS: See pertinent positives and negatives per HPI.  Past Medical History:  Diagnosis Date   Abnormal bleeding in menstrual cycle    Anemia    Atypical endometrial hyperplasia    Cancer (HCC)    Clear cell carcinoma of kidney (HCC)    Diabetes mellitus (HCC)    Diarrhea    Endometrial hyperplasia without atypia, complex    Environmental allergies    GAD (generalized anxiety disorder)    Generalized anxiety disorder    GERD (gastroesophageal reflux disease)    Hypercholesterolemia    Hypertension    Insomnia    Iron deficiency anemia    Leukocytosis    Major depressive disorder    Numbness and  tingling    Palpitations    Renal mass    Symptomatic anemia    Thrombocytosis    Type 2 diabetes mellitus without complications (HCC)    Vitamin D deficiency     Past Surgical History:  Procedure Laterality Date   BIOPSY  09/30/2023   Procedure: BIOPSY;  Surgeon: Regis Bill, MD;  Location: [facility] ENDOSCOPY;  Service: Gastroenterology;;   COLONOSCOPY     COLONOSCOPY WITH PROPOFOL N/A 05/28/2018   Procedure: COLONOSCOPY WITH PROPOFOL;  Surgeon: Scot Jun, MD;  Location: [facility] ENDOSCOPY;  Service: Endoscopy;  Laterality: N/A;   COLONOSCOPY WITH PROPOFOL N/A 09/30/2023   Procedure: COLONOSCOPY WITH PROPOFOL;  Surgeon: Regis Bill, MD;  Location: [facility] ENDOSCOPY;  Service: Gastroenterology;  Laterality: N/A;   DILATION AND CURETTAGE OF UTERUS     ESOPHAGOGASTRODUODENOSCOPY (EGD) WITH PROPOFOL N/A 05/28/2018   Procedure: ESOPHAGOGASTRODUODENOSCOPY (EGD) WITH PROPOFOL;  Surgeon: Scot Jun, MD;  Location: [facility] ENDOSCOPY;  Service: Endoscopy;  Laterality: N/A;   ESOPHAGOGASTRODUODENOSCOPY (EGD) WITH PROPOFOL N/A 09/30/2023   Procedure: ESOPHAGOGASTRODUODENOSCOPY (EGD) WITH PROPOFOL;  Surgeon: Regis Bill, MD;  Location: [facility] ENDOSCOPY;  Service: Gastroenterology;  Laterality: N/A;   HYSTEROSCOPY WITH D & C     and polyp removal   POLYPECTOMY  09/30/2023   Procedure: POLYPECTOMY;  Surgeon: Regis Bill, MD;  Location: [facility] ENDOSCOPY;  Service: Gastroenterology;;    Family History  Problem Relation Age of Onset   Hypertension Mother    Diabetes Mother    Diverticulosis Mother    Depression Mother  Hypercholesterolemia Father    Hypertension Father    Cancer Father    Heart disease Maternal Grandfather    Diverticulosis Paternal Grandmother    Hyperlipidemia Paternal Grandmother    Heart failure Paternal Grandmother    Cancer Maternal Grandmother    Alcoholism Paternal Uncle     SOCIAL HX: reviewed.    Current Outpatient Medications:     cefdinir (OMNICEF) 300 MG capsule, Take 300 mg by mouth 2 (two) times daily., Disp: , Rfl:    albuterol (VENTOLIN HFA) 108 (90 Base) MCG/ACT inhaler, Inhale 2 puffs into the lungs every 6 (six) hours as needed for wheezing. Patient request ProAir brand only., Disp: 18 g, Rfl: 2   amLODipine (NORVASC) 10 MG tablet, Take 1/2 (one-half) tablet by mouth twice daily, Disp: 90 tablet, Rfl: 2   Cholecalciferol (VITAMIN D3) 50 MCG (2000 UT) capsule, Take 2,000 Units by mouth daily., Disp: , Rfl:    Cranberry (RA CRANBERRY) 500 MG CAPS, Take 500 mg by mouth daily., Disp: , Rfl:    D-Mannose 500 MG CAPS, Take 500 mg by mouth in the morning and at bedtime., Disp: , Rfl:    famotidine (PEPCID) 20 MG tablet, TAKE 2 TABLETS (40 MG) BY MOUTH NIGHTLY, Disp: , Rfl:    fluticasone (FLONASE) 50 MCG/ACT nasal spray, Place 1 spray into both nostrils daily., Disp: 48 g, Rfl: 1   glucose blood test strip, Use as directed to check sugars BID. Dx E11.9, Disp: 100 each, Rfl: 12   Lancets MISC, by Does not apply route. Use 1 lancets three times a day, Disp: , Rfl:    levonorgestrel (MIRENA) 20 MCG/DAY IUD, by Intrauterine route., Disp: , Rfl:    loratadine (CLARITIN) 10 MG tablet, Take 10 mg by mouth daily., Disp: , Rfl:    losartan (COZAAR) 100 MG tablet, Take 1 tablet (100 mg total) by mouth daily., Disp: 90 tablet, Rfl: 2   metFORMIN (GLUCOPHAGE) 500 MG tablet, Take 2 tablets (1,000 mg total) by mouth 2 (two) times daily., Disp: 360 tablet, Rfl: 1   metoprolol succinate (TOPROL-XL) 100 MG 24 hr tablet, Take 0.5 tablets (50 mg total) by mouth 2 (two) times daily. Take with or immediately following a meal., Disp: 90 tablet, Rfl: 2   pravastatin (PRAVACHOL) 40 MG tablet, Take 1 tablet by mouth once daily, Disp: 90 tablet, Rfl: 0   Probiotic Product (FEM-DOPHILUS WOMENS PO), Take 1 tablet by mouth daily., Disp: , Rfl:    Vilazodone HCl (VIIBRYD) 10 MG TABS, Take 5 mg by mouth daily., Disp: , Rfl:   EXAM:  GENERAL: alert,  oriented, appears well and in no acute distress  HEENT: atraumatic, conjunttiva clear, no obvious abnormalities on inspection of external nose and ears  NECK: normal movements of the head and neck  LUNGS: on inspection no signs of respiratory distress, breathing rate appears normal, no obvious gross SOB, gasping or wheezing  CV: no obvious cyanosis  PSYCH/NEURO: pleasant and cooperative, no obvious depression or anxiety, speech and thought processing grossly intact  ASSESSMENT AND PLAN:  Discussed the following assessment and plan:  Problem List Items Addressed This Visit     GERD (gastroesophageal reflux disease)   Continue prilosec.       Hypercholesterolemia   Continue pravastatin.  Diet and exercise.  Had previously discussed changing to crestor.  Had wanted to hold on change.  Follow lipid panel and liver function tests.  Diet and exercise.       Hypertension  Continue losartan, metoprolol and amlodipine.  Follow pressures.       Iron deficiency anemia   Had f/u with Dr Donneta Romberg 09/17/23 - hgb 12.2. iron infusion.      Renal cell carcinoma (HCC)   S/p recent partial nephrectomy as outlined.  Continue f/u with urology for surveillance.       Relevant Medications   cefdinir (OMNICEF) 300 MG capsule   Severe obesity (BMI >= 40) (HCC)   Diet and exercise.       Stress   Increased stress related to her current medical issues and plans for upcoming procedure.  Has good support. Follow.       Thrombocytosis (HCC)   Has been evaluated by hematology.  Felt to be reactive.  Hematology following cbc.  Receiving iron infusions.       Type 2 diabetes mellitus with hyperglycemia (HCC) - Primary    Return in about 4 weeks (around 11/22/2023) for follow-up.   I discussed the assessment and treatment plan with the patient. The patient was provided an opportunity to ask questions and all were answered. The patient agreed with the plan and demonstrated an understanding of  the instructions.   The patient was advised to call back or seek an in-person evaluation if the symptoms worsen or if the condition fails to improve as anticipated.    Dale Marble, MD

## 2023-10-31 ENCOUNTER — Encounter: Payer: Self-pay | Admitting: Internal Medicine

## 2023-10-31 NOTE — Assessment & Plan Note (Signed)
 Continue prilosec

## 2023-10-31 NOTE — Assessment & Plan Note (Signed)
Has been evaluated by hematology.  Felt to be reactive.  Hematology following cbc.  Receiving iron infusions.

## 2023-10-31 NOTE — Assessment & Plan Note (Signed)
 Have discussed diet and exercise.  Have discussed importance of getting sugars under control, especially given upcoming procedure. Have discussed medications.  Have discussed treatment options. Discussed again today. Discussed GLP 1 agonist - including mounjaro and ozempic.  Also discussed if desires no injection - oral form - rybelsus. Discussed possible side effects, GI side effects.  Have also discussed SGLT2 inhibitors - including farxiga and jardiance. Have discussed risk of increased UTIs and yeast infections with these medications. Have discussed other oral hypoglycemics, including glipizide, januvia. Concern metformin contributing to increased diarrhea.  She is ready - after her surgery - to change her medication. Work on diet and exercise. Follow sugars.

## 2023-10-31 NOTE — Assessment & Plan Note (Signed)
 Diet and exercise.

## 2023-10-31 NOTE — Assessment & Plan Note (Signed)
 Continue pravastatin.  Diet and exercise.  Had previously discussed changing to crestor.  Had wanted to hold on change.  Follow lipid panel and liver function tests.  Diet and exercise.

## 2023-10-31 NOTE — Assessment & Plan Note (Signed)
 Had f/u with Dr Donneta Romberg 09/17/23 - hgb 12.2. iron infusion.

## 2023-10-31 NOTE — Assessment & Plan Note (Signed)
 S/p recent partial nephrectomy as outlined.  Continue f/u with urology for surveillance.

## 2023-10-31 NOTE — Assessment & Plan Note (Signed)
 Continue losartan, metoprolol and amlodipine.  Follow pressures.

## 2023-10-31 NOTE — Assessment & Plan Note (Signed)
 Increased stress related to her current medical issues and plans for upcoming procedure.  Has good support. Follow.

## 2023-11-03 ENCOUNTER — Encounter: Payer: Self-pay | Admitting: Internal Medicine

## 2023-11-04 NOTE — Telephone Encounter (Signed)
 Reviewed message. Please call and confirm she is doing ok. I am glad her surgery went well. Regarding the incidental finding of splenic aneurysm, I agree with continuing to follow to confirm no change. I am ok with scheduling an appt if she desires appt.

## 2023-11-14 ENCOUNTER — Telehealth (INDEPENDENT_AMBULATORY_CARE_PROVIDER_SITE_OTHER): Admitting: Internal Medicine

## 2023-11-14 VITALS — Ht 68.0 in | Wt 305.0 lb

## 2023-11-14 DIAGNOSIS — N200 Calculus of kidney: Secondary | ICD-10-CM

## 2023-11-14 DIAGNOSIS — E7800 Pure hypercholesterolemia, unspecified: Secondary | ICD-10-CM | POA: Diagnosis not present

## 2023-11-14 DIAGNOSIS — D509 Iron deficiency anemia, unspecified: Secondary | ICD-10-CM

## 2023-11-14 DIAGNOSIS — I728 Aneurysm of other specified arteries: Secondary | ICD-10-CM

## 2023-11-14 DIAGNOSIS — C649 Malignant neoplasm of unspecified kidney, except renal pelvis: Secondary | ICD-10-CM

## 2023-11-14 DIAGNOSIS — E1165 Type 2 diabetes mellitus with hyperglycemia: Secondary | ICD-10-CM | POA: Diagnosis not present

## 2023-11-14 DIAGNOSIS — I10 Essential (primary) hypertension: Secondary | ICD-10-CM

## 2023-11-14 NOTE — Progress Notes (Signed)
 Patient ID: Lindsey Strickland, female   DOB: June 03, 1976, 48 y.o.   MRN: [id number]   Virtual Visit via video Note  I connected with Toneka Fullen by a video enabled telemedicine application and verified that I am speaking with the correct person using two identifiers. Location patient: home Location provider: work Persons participating in the virtual visit: patient, provider  The limitations, risks, security and privacy concerns of performing an evaluation and management service by vidoe and the availability of in person appointments have ben discussed. It has also been discussed with the patient that there may be a patient responsible charge related to this service. The patient expressed understanding and agreed to proceed.   Reason for visit: work in appt.   HPI: Here for work in appt. Is s/p left percutaneous nephrostolithotomy/pyelostolithotomy w/wo dilat, endoscop, litho, stent (10/2023). Did well with surgery. Post op CT - resolution of large staghorn calculus and remaining small non obstructing left renal stones. Discharged on bactrim. 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 Recommended f/u CTA of abdomen in one year. Had questions about the splenic aneurysm and f/u. Overall doing well since her surgery. Breathing stable. No increased pain. Has f/u ultrasound 12/2023.    ROS: See pertinent positives and negatives per HPI.  Past Medical History:  Diagnosis Date   Abnormal bleeding in menstrual cycle    Anemia    Atypical endometrial hyperplasia    Cancer (HCC)    Clear cell carcinoma of kidney (HCC)    Diabetes mellitus (HCC)    Diarrhea    Endometrial hyperplasia without atypia, complex    Environmental allergies    GAD (generalized anxiety disorder)    Generalized anxiety disorder    GERD (gastroesophageal reflux disease)    Hypercholesterolemia    Hypertension    Insomnia    Iron deficiency anemia    Leukocytosis    Major depressive disorder     Numbness and tingling    Palpitations    Renal mass    Symptomatic anemia    Thrombocytosis    Type 2 diabetes mellitus without complications (HCC)    Vitamin D deficiency     Past Surgical History:  Procedure Laterality Date   BIOPSY  09/30/2023   Procedure: BIOPSY;  Surgeon: Shane Darling, MD;  Location: [facility] ENDOSCOPY;  Service: Gastroenterology;;   COLONOSCOPY     COLONOSCOPY WITH PROPOFOL N/A 05/28/2018   Procedure: COLONOSCOPY WITH PROPOFOL;  Surgeon: Cassie Click, MD;  Location: [facility] ENDOSCOPY;  Service: Endoscopy;  Laterality: N/A;   COLONOSCOPY WITH PROPOFOL N/A 09/30/2023   Procedure: COLONOSCOPY WITH PROPOFOL;  Surgeon: Shane Darling, MD;  Location: [facility] ENDOSCOPY;  Service: Gastroenterology;  Laterality: N/A;   DILATION AND CURETTAGE OF UTERUS     ESOPHAGOGASTRODUODENOSCOPY (EGD) WITH PROPOFOL N/A 05/28/2018   Procedure: ESOPHAGOGASTRODUODENOSCOPY (EGD) WITH PROPOFOL;  Surgeon: Cassie Click, MD;  Location: [facility] ENDOSCOPY;  Service: Endoscopy;  Laterality: N/A;   ESOPHAGOGASTRODUODENOSCOPY (EGD) WITH PROPOFOL N/A 09/30/2023   Procedure: ESOPHAGOGASTRODUODENOSCOPY (EGD) WITH PROPOFOL;  Surgeon: Shane Darling, MD;  Location: [facility] ENDOSCOPY;  Service: Gastroenterology;  Laterality: N/A;   HYSTEROSCOPY WITH D & C     and polyp removal   POLYPECTOMY  09/30/2023   Procedure: POLYPECTOMY;  Surgeon: Shane Darling, MD;  Location: [facility] ENDOSCOPY;  Service: Gastroenterology;;    Family History  Problem Relation Age of Onset   Hypertension Mother    Diabetes Mother    Diverticulosis Mother  Depression Mother    Hypercholesterolemia Father    Hypertension Father    Cancer Father    Heart disease Maternal Grandfather    Diverticulosis Paternal Grandmother    Hyperlipidemia Paternal Grandmother    Heart failure Paternal Grandmother    Cancer Maternal Grandmother    Alcoholism Paternal Uncle     SOCIAL HX: reviewed.    Current Outpatient  Medications:    albuterol (VENTOLIN HFA) 108 (90 Base) MCG/ACT inhaler, Inhale 2 puffs into the lungs every 6 (six) hours as needed for wheezing. Patient request ProAir brand only., Disp: 18 g, Rfl: 2   amLODipine (NORVASC) 10 MG tablet, Take 1/2 (one-half) tablet by mouth twice daily, Disp: 90 tablet, Rfl: 2   cefdinir (OMNICEF) 300 MG capsule, Take 300 mg by mouth 2 (two) times daily., Disp: , Rfl:    Cholecalciferol (VITAMIN D3) 50 MCG (2000 UT) capsule, Take 2,000 Units by mouth daily., Disp: , Rfl:    Cranberry (RA CRANBERRY) 500 MG CAPS, Take 500 mg by mouth daily., Disp: , Rfl:    D-Mannose 500 MG CAPS, Take 500 mg by mouth in the morning and at bedtime., Disp: , Rfl:    famotidine (PEPCID) 20 MG tablet, TAKE 2 TABLETS (40 MG) BY MOUTH NIGHTLY, Disp: , Rfl:    fluticasone (FLONASE) 50 MCG/ACT nasal spray, Place 1 spray into both nostrils daily., Disp: 48 g, Rfl: 1   glucose blood test strip, Use as directed to check sugars BID. Dx E11.9, Disp: 100 each, Rfl: 12   Lancets MISC, by Does not apply route. Use 1 lancets three times a day, Disp: , Rfl:    levonorgestrel (MIRENA) 20 MCG/DAY IUD, by Intrauterine route., Disp: , Rfl:    loratadine (CLARITIN) 10 MG tablet, Take 10 mg by mouth daily., Disp: , Rfl:    losartan (COZAAR) 100 MG tablet, Take 1 tablet (100 mg total) by mouth daily., Disp: 90 tablet, Rfl: 2   metFORMIN (GLUCOPHAGE) 500 MG tablet, Take 2 tablets (1,000 mg total) by mouth 2 (two) times daily., Disp: 360 tablet, Rfl: 1   metoprolol succinate (TOPROL-XL) 100 MG 24 hr tablet, Take 0.5 tablets (50 mg total) by mouth 2 (two) times daily. Take with or immediately following a meal., Disp: 90 tablet, Rfl: 2   pravastatin (PRAVACHOL) 40 MG tablet, Take 1 tablet by mouth once daily, Disp: 90 tablet, Rfl: 0   Probiotic Product (FEM-DOPHILUS WOMENS PO), Take 1 tablet by mouth daily., Disp: , Rfl:    Vilazodone HCl (VIIBRYD) 10 MG TABS, Take 5 mg by mouth daily., Disp: , Rfl:    EXAM:  GENERAL: alert, oriented, appears well and in no acute distress  HEENT: atraumatic, conjunttiva clear, no obvious abnormalities on inspection of external nose and ears  NECK: normal movements of the head and neck  LUNGS: on inspection no signs of respiratory distress, breathing rate appears normal, no obvious gross SOB, gasping or wheezing  CV: no obvious cyanosis  PSYCH/NEURO: pleasant and cooperative, no obvious depression or anxiety, speech and thought processing grossly intact  ASSESSMENT AND PLAN:  Discussed the following assessment and plan:  Problem List Items Addressed This Visit     Diabetes mellitus (HCC) - Primary   Recent surgery as outlined. Have discussed the need for optimal sugar control. Discussed diet and exercise. Discussed treatment options, including GLP 1 agonist.  She has been having increased diarrhea. Feels related to metformin. Have discussed changing medications. Wants to hold until further out from her  surgery. Low carb diet and exercise. Schedule f/u soon to reassess. Of note,  have also discussed SGLT2 inhibitors - including farxiga and jardiance. Have discussed risk of increased UTIs and yeast infections with these medications. Have discussed other oral hypoglycemics, including glipizide, januvia.        Hypercholesterolemia   Continue pravastatin.  Diet and exercise.  Had previously discussed changing to crestor.  Had wanted to hold on change.  Follow lipid panel and liver function tests.  Diet and exercise.       Hypertension   Continue losartan, metoprolol and amlodipine.  Follow pressures.       Iron deficiency anemia   Sees Dr Valentine Gasmen. Has follow up in 12/2023.       Nephrolithiasis   Is s/p left percutaneous nephrostolithotomy/pyelostolithotomy w/wo dilat, endoscop, litho, stent (10/2023). Did well with surgery. Post op CT - resolution of large staghorn calculus and remaining small non obstructing left renal stones. Doing well. Has  f/u planned in 12/2023.       Renal cell carcinoma (HCC)   S/p recent partial nephrectomy as outlined.  Continue f/u with urology for surveillance.       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 Will have [facility] evaluate and give recommendations for follow up (surveillance).       Relevant Orders   Ambulatory referral to Vascular Surgery    Return in about 4 weeks (around 12/12/2023).   I discussed the assessment and treatment plan with the patient. The patient was provided an opportunity to ask questions and all were answered. The patient agreed with the plan and demonstrated an understanding of the instructions.   The patient was advised to call back or seek an in-person evaluation if the symptoms worsen or if the condition fails to improve as anticipated.    Dellar Fenton, MD

## 2023-11-17 ENCOUNTER — Encounter: Payer: Self-pay | Admitting: Internal Medicine

## 2023-11-17 DIAGNOSIS — I728 Aneurysm of other specified arteries: Secondary | ICD-10-CM | POA: Insufficient documentation

## 2023-11-17 NOTE — Assessment & Plan Note (Signed)
 S/p recent partial nephrectomy as outlined.  Continue f/u with urology for surveillance.

## 2023-11-17 NOTE — Assessment & Plan Note (Signed)
 Is s/p left percutaneous nephrostolithotomy/pyelostolithotomy w/wo dilat, endoscop, litho, stent (10/2023). Did well with surgery. Post op CT - resolution of large staghorn calculus and remaining small non obstructing left renal stones. Doing well. Has f/u planned in 12/2023.

## 2023-11-17 NOTE — Assessment & Plan Note (Signed)
 Sees Dr Valentine Gasmen. Has follow up in 12/2023.

## 2023-11-17 NOTE — Assessment & Plan Note (Signed)
 Continue losartan, metoprolol and amlodipine.  Follow pressures.

## 2023-11-17 NOTE — Assessment & Plan Note (Signed)
 Continue pravastatin.  Diet and exercise.  Had previously discussed changing to crestor.  Had wanted to hold on change.  Follow lipid panel and liver function tests.  Diet and exercise.

## 2023-11-17 NOTE — Assessment & Plan Note (Signed)
 Recent surgery as outlined. Have discussed the need for optimal sugar control. Discussed diet and exercise. Discussed treatment options, including GLP 1 agonist.  She has been having increased diarrhea. Feels related to metformin. Have discussed changing medications. Wants to hold until further out from her surgery. Low carb diet and exercise. Schedule f/u soon to reassess. Of note,  have also discussed SGLT2 inhibitors - including farxiga and jardiance. Have discussed risk of increased UTIs and yeast infections with these medications. Have discussed other oral hypoglycemics, including glipizide, januvia.

## 2023-11-17 NOTE — Assessment & Plan Note (Signed)
 Found incidentally on recent scan. Will have [facility] evaluate and give recommendations for follow up (surveillance).

## 2023-11-18 ENCOUNTER — Telehealth: Payer: Self-pay

## 2023-11-18 NOTE — Telephone Encounter (Signed)
 I left a voicemail for patient letting her know Dr. Dellar Fenton asked us  to call her to schedule a follow-up appointment with her in four weeks.  I asked patient to please call us  back to schedule.  E2C2 - when patient calls back, please schedule an appointment for her with Dr. Dellar Fenton in four weeks.  If there is no availability, please transfer call to our office.

## 2023-11-18 NOTE — Telephone Encounter (Signed)
 I also sent a message to patient via MyChart.

## 2023-12-08 ENCOUNTER — Other Ambulatory Visit: Payer: Self-pay | Admitting: Internal Medicine

## 2023-12-16 ENCOUNTER — Ambulatory Visit: Admitting: Internal Medicine

## 2023-12-16 ENCOUNTER — Encounter: Payer: Self-pay | Admitting: Internal Medicine

## 2023-12-16 NOTE — Progress Notes (Signed)
 Patient ID: Lindsey Strickland, female   DOB: Mar 13, 1976, 48 y.o.   MRN: [id number] Did not keep appt.

## 2023-12-16 NOTE — Progress Notes (Deleted)
 Subjective:    Patient ID: Lindsey Strickland, female    DOB: Feb 01, 1976, 48 y.o.   MRN: [id number]  Patient here for No chief complaint on file.   HPI Here for a scheduled follow up. Is s/p left percutaneous nephrostolithotomy/pyelostolithotomy w/wo dilat, endoscop, litho, stent (10/2023). Did well with surgery. Post op CT - resolution of large staghorn calculus and remaining small non obstructing left renal stones. Discharged on bactrim. 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 Recommended f/u CTA of abdomen in one year.    Past Medical History:  Diagnosis Date   Abnormal bleeding in menstrual cycle    Anemia    Atypical endometrial hyperplasia    Cancer (HCC)    Clear cell carcinoma of kidney (HCC)    Diabetes mellitus (HCC)    Diarrhea    Endometrial hyperplasia without atypia, complex    Environmental allergies    GAD (generalized anxiety disorder)    Generalized anxiety disorder    GERD (gastroesophageal reflux disease)    Hypercholesterolemia    Hypertension    Insomnia    Iron  deficiency anemia    Leukocytosis    Major depressive disorder    Numbness and tingling    Palpitations    Renal mass    Symptomatic anemia    Thrombocytosis    Type 2 diabetes mellitus without complications (HCC)    Vitamin D  deficiency    Past Surgical History:  Procedure Laterality Date   BIOPSY  09/30/2023   Procedure: BIOPSY;  Surgeon: Shane Darling, MD;  Location: [facility] ENDOSCOPY;  Service: Gastroenterology;;   COLONOSCOPY     COLONOSCOPY WITH PROPOFOL  N/A 05/28/2018   Procedure: COLONOSCOPY WITH PROPOFOL ;  Surgeon: Cassie Click, MD;  Location: [facility] ENDOSCOPY;  Service: Endoscopy;  Laterality: N/A;   COLONOSCOPY WITH PROPOFOL  N/A 09/30/2023   Procedure: COLONOSCOPY WITH PROPOFOL ;  Surgeon: Shane Darling, MD;  Location: [facility] ENDOSCOPY;  Service: Gastroenterology;  Laterality: N/A;   DILATION AND CURETTAGE OF UTERUS      ESOPHAGOGASTRODUODENOSCOPY (EGD) WITH PROPOFOL  N/A 05/28/2018   Procedure: ESOPHAGOGASTRODUODENOSCOPY (EGD) WITH PROPOFOL ;  Surgeon: Cassie Click, MD;  Location: [facility] ENDOSCOPY;  Service: Endoscopy;  Laterality: N/A;   ESOPHAGOGASTRODUODENOSCOPY (EGD) WITH PROPOFOL  N/A 09/30/2023   Procedure: ESOPHAGOGASTRODUODENOSCOPY (EGD) WITH PROPOFOL ;  Surgeon: Shane Darling, MD;  Location: [facility] ENDOSCOPY;  Service: Gastroenterology;  Laterality: N/A;   HYSTEROSCOPY WITH D & C     and polyp removal   POLYPECTOMY  09/30/2023   Procedure: POLYPECTOMY;  Surgeon: Shane Darling, MD;  Location: [facility] ENDOSCOPY;  Service: Gastroenterology;;   Family History  Problem Relation Age of Onset   Hypertension Mother    Diabetes Mother    Diverticulosis Mother    Depression Mother    Hypercholesterolemia Father    Hypertension Father    Cancer Father    Heart disease Maternal Grandfather    Diverticulosis Paternal Grandmother    Hyperlipidemia Paternal Grandmother    Heart failure Paternal Grandmother    Cancer Maternal Grandmother    Alcoholism Paternal Uncle    Social History   Socioeconomic History   Marital status: Single    Spouse name: Not on file   Number of children: 2   Years of education: Not on file   Highest education level: High school graduate  Occupational History    Employer: [employer]  Tobacco Use   Smoking status: Former    Current packs/day: 0.00  Types: Cigarettes    Quit date: 08/06/2000    Years since quitting: 23.3   Smokeless tobacco: Never  Vaping Use   Vaping status: Never Used  Substance and Sexual Activity   Alcohol use: Not Currently    Comment: rare   Drug use: No   Sexual activity: Yes    Birth control/protection: I.U.D.  Other Topics Concern   Not on file  Social History Narrative   Not on file   Social Drivers of Health   Financial Resource Strain: Low Risk  (10/31/2023)   Received from [facility]   Overall Financial Resource  Strain (CARDIA)    Difficulty of Paying Living Expenses: Not hard at all  Food Insecurity: No Food Insecurity (10/31/2023)   [facility] from [facility]   Hunger Vital Sign    Worried About Running Out of Food in the Last Year: Never true    Ran Out of Food in the Last Year: Never true  Transportation Needs: No Transportation Needs (10/31/2023)   Received from [facility] - Transportation    Lack of Transportation (Medical): No    Lack of Transportation (Non-Medical): No  Physical Activity: Insufficiently Active (10/25/2023)   Exercise Vital Sign    Days of Exercise per Week: 1 day    Minutes of Exercise per Session: 10 min  Stress: Stress Concern Present (10/25/2023)   [employer] of Occupational Health - Occupational Stress Questionnaire    Feeling of Stress : Very much  Social Connections: Socially Isolated (10/25/2023)   Social Connection and Isolation Panel [NHANES]    Frequency of Communication with Friends and Family: Once a week    Frequency of Social Gatherings with Friends and Family: Never    Attends Religious Services: Never    Database administrator or Organizations: No    Attends Engineer, structural: Not on file    Marital Status: Never married     Review of Systems     Objective:     There were no vitals taken for this visit. Wt Readings from Last 3 Encounters:  11/14/23 (!) 305 lb (138.3 kg)  09/30/23 (!) 302 lb 6.4 oz (137.2 kg)  09/17/23 (!) 309 lb 9.6 oz (140.4 kg)    Physical Exam  {Perform Simple Foot Exam  Perform Detailed exam:1} {Insert foot Exam (Optional):30965}   Outpatient Encounter Medications as of 12/16/2023  Medication Sig   albuterol  (VENTOLIN  HFA) 108 (90 Base) MCG/ACT inhaler Inhale 2 puffs into the lungs every 6 (six) hours as needed for wheezing. Patient request ProAir  brand only.   amLODipine  (NORVASC ) 10 MG tablet Take 1/2 (one-half) tablet by mouth twice daily   cefdinir  (OMNICEF ) 300 MG capsule  Take 300 mg by mouth 2 (two) times daily.   Cholecalciferol (VITAMIN D3) 50 MCG (2000 UT) capsule Take 2,000 Units by mouth daily.   Cranberry (RA CRANBERRY) 500 MG CAPS Take 500 mg by mouth daily.   D-Mannose 500 MG CAPS Take 500 mg by mouth in the morning and at bedtime.   famotidine (PEPCID) 20 MG tablet TAKE 2 TABLETS (40 MG) BY MOUTH NIGHTLY   fluticasone  (FLONASE ) 50 MCG/ACT nasal spray Place 1 spray into both nostrils daily.   glucose blood test strip Use as directed to check sugars BID. Dx E11.9   Lancets MISC by Does not apply route. Use 1 lancets three times a day   levonorgestrel (MIRENA) 20 MCG/DAY IUD by Intrauterine route.   loratadine (  CLARITIN) 10 MG tablet Take 10 mg by mouth daily.   losartan  (COZAAR ) 100 MG tablet Take 1 tablet by mouth once daily   metFORMIN  (GLUCOPHAGE ) 500 MG tablet Take 2 tablets (1,000 mg total) by mouth 2 (two) times daily.   metoprolol  succinate (TOPROL -XL) 100 MG 24 hr tablet Take 0.5 tablets (50 mg total) by mouth 2 (two) times daily. Take with or immediately following a meal.   pravastatin  (PRAVACHOL ) 40 MG tablet Take 1 tablet by mouth once daily   Probiotic Product (FEM-DOPHILUS WOMENS PO) Take 1 tablet by mouth daily.   Vilazodone  HCl (VIIBRYD ) 10 MG TABS Take 5 mg by mouth daily.   No facility-administered encounter medications on file as of 12/16/2023.     Lab Results  Component Value Date   WBC 11.5 (H) 09/17/2023   HGB 12.1 09/17/2023   HCT 38.2 09/17/2023   PLT 397 09/17/2023   GLUCOSE 117 (H) 09/17/2023   CHOL 177 07/25/2023   TRIG 189.0 (H) 07/25/2023   HDL 43.00 07/25/2023   LDLDIRECT 132.0 11/02/2020   LDLCALC 96 07/25/2023   ALT 15 07/25/2023   AST 11 07/25/2023   NA 135 09/17/2023   K 4.1 09/17/2023   CL 101 09/17/2023   CREATININE 0.56 09/17/2023   BUN 17 09/17/2023   CO2 23 09/17/2023   TSH 3.55 07/25/2023   INR 0.93 05/05/2018   HGBA1C 8.1 (H) 07/25/2023   MICROALBUR 45.0 (H) 07/25/2023    No results  found.     Assessment & Plan:  There are no diagnoses linked to this encounter.   Dellar Fenton, MD

## 2023-12-16 NOTE — Assessment & Plan Note (Signed)
 Is s/p left percutaneous nephrostolithotomy/pyelostolithotomy w/wo dilat, endoscop, litho, stent (10/2023). Did well with surgery. Post op CT - resolution of large staghorn calculus and remaining small non obstructing left renal stones. Discharged on bactrim. 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 Recommended f/u CTA of abdomen in one year.

## 2023-12-20 ENCOUNTER — Encounter: Payer: Self-pay | Admitting: Internal Medicine

## 2023-12-20 ENCOUNTER — Ambulatory Visit (INDEPENDENT_AMBULATORY_CARE_PROVIDER_SITE_OTHER): Admitting: Internal Medicine

## 2023-12-20 VITALS — BP 124/72 | HR 87 | Temp 98.1°F | Ht 68.0 in | Wt 308.4 lb

## 2023-12-20 DIAGNOSIS — C649 Malignant neoplasm of unspecified kidney, except renal pelvis: Secondary | ICD-10-CM

## 2023-12-20 DIAGNOSIS — N390 Urinary tract infection, site not specified: Secondary | ICD-10-CM

## 2023-12-20 DIAGNOSIS — I10 Essential (primary) hypertension: Secondary | ICD-10-CM | POA: Diagnosis not present

## 2023-12-20 DIAGNOSIS — N200 Calculus of kidney: Secondary | ICD-10-CM | POA: Diagnosis not present

## 2023-12-20 DIAGNOSIS — E1165 Type 2 diabetes mellitus with hyperglycemia: Secondary | ICD-10-CM | POA: Diagnosis not present

## 2023-12-20 DIAGNOSIS — E7800 Pure hypercholesterolemia, unspecified: Secondary | ICD-10-CM

## 2023-12-20 DIAGNOSIS — K219 Gastro-esophageal reflux disease without esophagitis: Secondary | ICD-10-CM

## 2023-12-20 DIAGNOSIS — Z7985 Long-term (current) use of injectable non-insulin antidiabetic drugs: Secondary | ICD-10-CM

## 2023-12-20 DIAGNOSIS — D75839 Thrombocytosis, unspecified: Secondary | ICD-10-CM

## 2023-12-20 DIAGNOSIS — F439 Reaction to severe stress, unspecified: Secondary | ICD-10-CM

## 2023-12-20 DIAGNOSIS — I728 Aneurysm of other specified arteries: Secondary | ICD-10-CM

## 2023-12-20 LAB — HEMOGLOBIN A1C: Hgb A1c MFr Bld: 7.5 % — ABNORMAL HIGH (ref 4.6–6.5)

## 2023-12-20 LAB — CBC WITH DIFFERENTIAL/PLATELET
Basophils Absolute: 0 10*3/uL (ref 0.0–0.1)
Basophils Relative: 0.3 % (ref 0.0–3.0)
Eosinophils Absolute: 0.2 10*3/uL (ref 0.0–0.7)
Eosinophils Relative: 1.6 % (ref 0.0–5.0)
HCT: 36.2 % (ref 36.0–46.0)
Hemoglobin: 11.9 g/dL — ABNORMAL LOW (ref 12.0–15.0)
Lymphocytes Relative: 24.9 % (ref 12.0–46.0)
Lymphs Abs: 2.8 10*3/uL (ref 0.7–4.0)
MCHC: 32.8 g/dL (ref 30.0–36.0)
MCV: 87.3 fl (ref 78.0–100.0)
Monocytes Absolute: 0.7 10*3/uL (ref 0.1–1.0)
Monocytes Relative: 6.1 % (ref 3.0–12.0)
Neutro Abs: 7.6 10*3/uL (ref 1.4–7.7)
Neutrophils Relative %: 67.1 % (ref 43.0–77.0)
Platelets: 431 10*3/uL — ABNORMAL HIGH (ref 150.0–400.0)
RBC: 4.15 Mil/uL (ref 3.87–5.11)
RDW: 13.8 % (ref 11.5–15.5)
WBC: 11.3 10*3/uL — ABNORMAL HIGH (ref 4.0–10.5)

## 2023-12-20 LAB — LIPID PANEL
Cholesterol: 179 mg/dL (ref 0–200)
HDL: 41.5 mg/dL (ref >39.00)
LDL Cholesterol: 76 mg/dL (ref 0–99)
NonHDL: 137.83
Total CHOL/HDL Ratio: 4
Triglycerides: 310 mg/dL — ABNORMAL HIGH (ref 0.0–149.0)
VLDL: 62 mg/dL — ABNORMAL HIGH (ref 0.0–40.0)

## 2023-12-20 LAB — BASIC METABOLIC PANEL WITH GFR
BUN: 12 mg/dL (ref 6–23)
CO2: 26 meq/L (ref 19–32)
Calcium: 9.2 mg/dL (ref 8.4–10.5)
Chloride: 100 meq/L (ref 96–112)
Creatinine, Ser: 0.58 mg/dL (ref 0.40–1.20)
GFR: 109.31 mL/min (ref >60.00)
Glucose, Bld: 119 mg/dL — ABNORMAL HIGH (ref 70–99)
Potassium: 4 meq/L (ref 3.5–5.1)
Sodium: 136 meq/L (ref 135–145)

## 2023-12-20 LAB — HEPATIC FUNCTION PANEL
ALT: 19 U/L (ref 0–35)
AST: 14 U/L (ref 0–37)
Albumin: 4.3 g/dL (ref 3.5–5.2)
Alkaline Phosphatase: 63 U/L (ref 39–117)
Bilirubin, Direct: 0.1 mg/dL (ref 0.0–0.3)
Total Bilirubin: 0.3 mg/dL (ref 0.2–1.2)
Total Protein: 7.3 g/dL (ref 6.0–8.3)

## 2023-12-20 MED ORDER — OZEMPIC (0.25 OR 0.5 MG/DOSE) 2 MG/3ML ~~LOC~~ SOPN: 0.2500 mg | PEN_INJECTOR | SUBCUTANEOUS | 2 refills | Status: DC

## 2023-12-20 NOTE — Telephone Encounter (Signed)
 Can stop the metformin  and see if the symptoms resolve before starting ozempic. Let me know if persistent problems.

## 2023-12-20 NOTE — Patient Instructions (Signed)
 Stop metformin

## 2023-12-20 NOTE — Progress Notes (Signed)
 Subjective:    Patient ID: Lindsey Strickland, female    DOB: November 26, 1975, 48 y.o.   MRN: [id number]  Patient here for  Chief Complaint  Patient presents with   Medical Management of Chronic Issues    Follow-up    HPI Here for a scheduled follow up - follow up regarding hypercholesterolemia, hypertension and diabetes. She is accompanied by her husband. History obtained from both of them. 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 RUS 5/13 - small kidney stone in the right kidney (4mm). Recommended f/u CT in 5 months. Recommended to discontinue tamsulosin and solifenacin. Recommended f/u CT and CXR 05/2024 - RCC surveillance. Has been having increased diarrhea. She feels is related to metformin . Have discussed stopping metformin . Discussed other treatment options for her diabetes. Discussed GLP 1 agonist. Agreeable to start. Discussed possible side effects of the medication. Discussed contraindications. Breathing stable.    Past Medical History:  Diagnosis Date   Abnormal bleeding in menstrual cycle    Anemia    Atypical endometrial hyperplasia    Cancer (HCC)    Clear cell carcinoma of kidney (HCC)    Diabetes mellitus (HCC)    Diarrhea    Endometrial hyperplasia without atypia, complex    Environmental allergies    GAD (generalized anxiety disorder)    Generalized anxiety disorder    GERD (gastroesophageal reflux disease)    Hypercholesterolemia    Hypertension    Insomnia    Iron  deficiency anemia    Leukocytosis    Major depressive disorder    Numbness and tingling    Palpitations    Renal mass    Symptomatic anemia    Thrombocytosis    Type 2 diabetes mellitus without complications (HCC)    Vitamin D  deficiency    Past Surgical History:  Procedure Laterality Date   BIOPSY  09/30/2023   Procedure: BIOPSY;  Surgeon: Shane Darling, MD;  Location: [facility] ENDOSCOPY;  Service: Gastroenterology;;   COLONOSCOPY     COLONOSCOPY WITH  PROPOFOL  N/A 05/28/2018   Procedure: COLONOSCOPY WITH PROPOFOL ;  Surgeon: Cassie Click, MD;  Location: [facility] ENDOSCOPY;  Service: Endoscopy;  Laterality: N/A;   COLONOSCOPY WITH PROPOFOL  N/A 09/30/2023   Procedure: COLONOSCOPY WITH PROPOFOL ;  Surgeon: Shane Darling, MD;  Location: [facility] ENDOSCOPY;  Service: Gastroenterology;  Laterality: N/A;   DILATION AND CURETTAGE OF UTERUS     ESOPHAGOGASTRODUODENOSCOPY (EGD) WITH PROPOFOL  N/A 05/28/2018   Procedure: ESOPHAGOGASTRODUODENOSCOPY (EGD) WITH PROPOFOL ;  Surgeon: Cassie Click, MD;  Location: [facility] ENDOSCOPY;  Service: Endoscopy;  Laterality: N/A;   ESOPHAGOGASTRODUODENOSCOPY (EGD) WITH PROPOFOL  N/A 09/30/2023   Procedure: ESOPHAGOGASTRODUODENOSCOPY (EGD) WITH PROPOFOL ;  Surgeon: Shane Darling, MD;  Location: [facility] ENDOSCOPY;  Service: Gastroenterology;  Laterality: N/A;   HYSTEROSCOPY WITH D & C     and polyp removal   POLYPECTOMY  09/30/2023   Procedure: POLYPECTOMY;  Surgeon: Shane Darling, MD;  Location: [facility] ENDOSCOPY;  Service: Gastroenterology;;   Family History  Problem Relation Age of Onset   Hypertension Mother    Diabetes Mother    Diverticulosis Mother    Depression Mother    Hypercholesterolemia Father    Hypertension Father    Cancer Father    Heart disease Maternal Grandfather    Diverticulosis Paternal Grandmother    Hyperlipidemia Paternal Grandmother    Heart failure Paternal Grandmother    Cancer Maternal Grandmother    Alcoholism Paternal Uncle    Social  History   Socioeconomic History   Marital status: Single    Spouse name: Not on file   Number of children: 2   Years of education: Not on file   Highest education level: High school graduate  Occupational History    Employer: [employer]  Tobacco Use   Smoking status: Former    Current packs/day: 0.00    Types: Cigarettes    Quit date: 08/06/2000    Years since quitting: 23.4   Smokeless tobacco: Never  Vaping Use   Vaping  status: Never Used  Substance and Sexual Activity   Alcohol use: Not Currently    Comment: rare   Drug use: No   Sexual activity: Yes    Birth control/protection: I.U.D.  Other Topics Concern   Not on file  Social History Narrative   Not on file   Social Drivers of Health   Financial Resource Strain: Low Risk  (10/31/2023)   Received from [facility]   Overall Financial Resource Strain (CARDIA)    Difficulty of Paying Living Expenses: Not hard at all  Food Insecurity: No Food Insecurity (10/31/2023)   Received from [facility]   Hunger Vital Sign    Worried About Running Out of Food in the Last Year: Never true    Ran Out of Food in the Last Year: Never true  Transportation Needs: No Transportation Needs (10/31/2023)   Received from [facility] - Transportation    Lack of Transportation (Medical): No    Lack of Transportation (Non-Medical): No  Physical Activity: Insufficiently Active (10/25/2023)   Exercise Vital Sign    Days of Exercise per Week: 1 day    Minutes of Exercise per Session: 10 min  Stress: Stress Concern Present (10/25/2023)   [employer] of Occupational Health - Occupational Stress Questionnaire    Feeling of Stress : Very much  Social Connections: Socially Isolated (10/25/2023)   Social Connection and Isolation Panel [NHANES]    Frequency of Communication with Friends and Family: Once a week    Frequency of Social Gatherings with Friends and Family: Never    Attends Religious Services: Never    Database administrator or Organizations: No    Attends Engineer, structural: Not on file    Marital Status: Never married     Review of Systems  Constitutional:  Negative for appetite change and unexpected weight change.  HENT:  Negative for congestion and sinus pressure.   Respiratory:  Negative for cough, chest tightness and shortness of breath.   Cardiovascular:  Negative for chest pain, palpitations and leg swelling.   Gastrointestinal:  Positive for diarrhea. Negative for abdominal pain, nausea and vomiting.  Genitourinary:  Negative for difficulty urinating and dysuria.  Musculoskeletal:  Negative for joint swelling and myalgias.  Skin:  Negative for color change and rash.  Neurological:  Negative for dizziness and headaches.  Psychiatric/Behavioral:  Negative for agitation and dysphoric mood.        Objective:     BP 124/72 (Cuff Size: Large)   Pulse 87   Temp 98.1 F (36.7 C) (Oral)   Ht 5\' 8"  (1.727 m)   Wt (!) 308 lb 6 oz (139.9 kg)   SpO2 98%   BMI 46.89 kg/m  Wt Readings from Last 3 Encounters:  12/24/23 (!) 309 lb 12.8 oz (140.5 kg)  12/20/23 (!) 308 lb 6 oz (139.9 kg)  11/14/23 (!) 305 lb (138.3 kg)  Physical Exam Vitals reviewed.  Constitutional:      General: She is not in acute distress.    Appearance: Normal appearance.  HENT:     Head: Normocephalic and atraumatic.     Right Ear: External ear normal.     Left Ear: External ear normal.     Mouth/Throat:     Pharynx: No oropharyngeal exudate or posterior oropharyngeal erythema.  Eyes:     General: No scleral icterus.       Right eye: No discharge.        Left eye: No discharge.     Conjunctiva/sclera: Conjunctivae normal.  Neck:     Thyroid : No thyromegaly.  Cardiovascular:     Rate and Rhythm: Normal rate and regular rhythm.  Pulmonary:     Effort: No respiratory distress.     Breath sounds: Normal breath sounds. No wheezing.  Abdominal:     General: Bowel sounds are normal.     Palpations: Abdomen is soft.     Tenderness: There is no abdominal tenderness.  Musculoskeletal:        General: No swelling or tenderness.     Cervical back: Neck supple. No tenderness.  Lymphadenopathy:     Cervical: No cervical adenopathy.  Skin:    Findings: No erythema or rash.  Neurological:     Mental Status: She is alert.  Psychiatric:        Mood and Affect: Mood normal.        Behavior: Behavior normal.          Outpatient Encounter Medications as of 12/20/2023  Medication Sig   albuterol  (VENTOLIN  HFA) 108 (90 Base) MCG/ACT inhaler Inhale 2 puffs into the lungs every 6 (six) hours as needed for wheezing. Patient request ProAir  brand only.   Cholecalciferol (VITAMIN D3) 50 MCG (2000 UT) capsule Take 2,000 Units by mouth daily.   Cranberry (RA CRANBERRY) 500 MG CAPS Take 500 mg by mouth daily.   D-Mannose 500 MG CAPS Take 500 mg by mouth in the morning, at noon, and at bedtime.   famotidine (PEPCID) 20 MG tablet TAKE 2 TABLETS (40 MG) BY MOUTH NIGHTLY   fluticasone  (FLONASE ) 50 MCG/ACT nasal spray Place 1 spray into both nostrils daily.   glucose blood test strip Use as directed to check sugars BID. Dx E11.9   Lancets MISC by Does not apply route. Use 1 lancets three times a day   levonorgestrel (MIRENA) 20 MCG/DAY IUD by Intrauterine route.   loratadine (CLARITIN) 10 MG tablet Take 10 mg by mouth daily.   losartan  (COZAAR ) 100 MG tablet Take 1 tablet by mouth once daily   metFORMIN  (GLUCOPHAGE ) 500 MG tablet Take 2 tablets (1,000 mg total) by mouth 2 (two) times daily.   metoprolol  succinate (TOPROL -XL) 100 MG 24 hr tablet Take 0.5 tablets (50 mg total) by mouth 2 (two) times daily. Take with or immediately following a meal.   pravastatin  (PRAVACHOL ) 40 MG tablet Take 1 tablet by mouth once daily   Probiotic Product (FEM-DOPHILUS WOMENS PO) Take 1 tablet by mouth daily.   Vilazodone  HCl (VIIBRYD ) 10 MG TABS Take 5 mg by mouth daily.   [DISCONTINUED] cefdinir  (OMNICEF ) 300 MG capsule Take 300 mg by mouth 2 (two) times daily. (Patient not taking: Reported on 12/24/2023)   amLODipine  (NORVASC ) 10 MG tablet Take 1/2 (one-half) tablet by mouth twice daily   Semaglutide ,0.25 or 0.5MG /DOS, (OZEMPIC , 0.25 OR 0.5 MG/DOSE,) 2 MG/3ML SOPN Inject 0.25 mg into the skin once  a week. (Patient not taking: Reported on 12/24/2023)   No facility-administered encounter medications on file as of 12/20/2023.     Lab  Results  Component Value Date   WBC 11.6 (H) 12/24/2023   HGB 12.2 12/24/2023   HCT 37.9 12/24/2023   PLT 407 (H) 12/24/2023   GLUCOSE 117 (H) 12/24/2023   CHOL 179 12/20/2023   TRIG 310.0 (H) 12/20/2023   HDL 41.50 12/20/2023   LDLDIRECT 132.0 11/02/2020   LDLCALC 76 12/20/2023   ALT 19 12/20/2023   AST 14 12/20/2023   NA 134 (L) 12/24/2023   K 3.9 12/24/2023   CL 101 12/24/2023   CREATININE 0.74 12/24/2023   BUN 12 12/24/2023   CO2 22 12/24/2023   TSH 3.55 07/25/2023   INR 0.93 05/05/2018   HGBA1C 7.5 (H) 12/20/2023   MICROALBUR 45.0 (H) 07/25/2023       Assessment & Plan:  Nephrolithiasis Assessment & Plan: Is s/p left percutaneous nephrostolithotomy/pyelostolithotomy w/wo dilat, endoscop, litho, stent (10/2023). Did well with surgery. Post op CT - resolution of large staghorn calculus and remaining small non obstructing left renal stones. Discharged on bactrim. 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 Recommended f/u CTA of abdomen in one year. 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 RUS 5/13 - small kidney stone in the right kidney (4mm). Recommended f/u CT in 5 months. Recommended to discontinue tamsulosin and solifenacin. Recommended f/u CT and CXR 05/2024 - RCC surveillance.    Hypercholesterolemia Assessment & Plan: Continue pravastatin .  Diet and exercise.  Had previously discussed changing to crestor.  Had previously wanted to hold on change. Follow lipid panel. Continue diet and exercise.   Orders: -     Lipid panel -     Hepatic function panel -     CBC with Differential/Platelet  Type 2 diabetes mellitus with hyperglycemia, without long-term current use of insulin (HCC) Assessment & Plan: Low carb diet and exercise.  On metformin .  Appears that diarrhea is better off the medication. Have discussed other treatment options as outlined.  Has previously wanted to hold on changing medication. Ready  now. Will stop metformin . Start ozempic  .25mg  weekly. Discussed contraindications and possible side effects of medication. Continue low carb diet and exercise. Follow met b and A1c.   Orders: -     Hemoglobin A1c -     Basic metabolic panel with GFR  Frequent UTI Assessment & Plan: Evaluated [facility] - recurring UTIs. Had previously recommended topical estrogen and hydration. There was concern that stones may be contributing some to recurring infections.  Continue f/u with urology. Recent procedures as outlined. Hold on SGLT2 inhibitors. Follow. No current symptoms.    Thrombocytosis (HCC) Assessment & Plan: Has been evaluated by hematology.  Felt to be reactive.  Receiving iron  infusions. Continue f/u with hematology.    Stress Assessment & Plan: Overall appears to be handling things relatively well. Follow.    Splenic artery aneurysm [facility]) Assessment & Plan: Found incidentally on recent scan. Discussed having [facility] evaluate and give recommendations for follow up (surveillance).    Severe obesity (BMI >= 40) (HCC) Assessment & Plan: Continue diet and exercise. Start ozempic  as outlined.    Gastroesophageal reflux disease without esophagitis Assessment & Plan: Continue prilosec. No upper symptoms reported.    Primary hypertension Assessment & Plan: Continue losartan , metoprolol  and amlodipine .  Follow pressures. No changes in medication today. Blood pressure improved.  Renal cell carcinoma, unspecified laterality [facility]) Assessment & Plan:  Had f/u with urology 12/17/23 - history of left staghorn stone s/p PCNL 10/2023. RUS 5/13 - small kidney stone in the right kidney (4mm). Recommended f/u CT in 5 months. Recommended to discontinue tamsulosin and solifenacin. Recommended f/u CT and CXR 05/2024 - RCC surveillance.    Other orders -     Ozempic  (0.25 or 0.5 MG/DOSE); Inject 0.25 mg into the skin once a week. (Patient not taking: Reported on 12/24/2023)  Dispense: 3 mL; Refill:  2     Dellar Fenton, MD

## 2023-12-23 ENCOUNTER — Other Ambulatory Visit: Payer: Self-pay

## 2023-12-23 ENCOUNTER — Other Ambulatory Visit: Payer: Self-pay | Admitting: Internal Medicine

## 2023-12-23 ENCOUNTER — Ambulatory Visit: Payer: Self-pay | Admitting: Internal Medicine

## 2023-12-23 ENCOUNTER — Encounter: Payer: Self-pay | Admitting: Internal Medicine

## 2023-12-23 DIAGNOSIS — D509 Iron deficiency anemia, unspecified: Secondary | ICD-10-CM

## 2023-12-23 DIAGNOSIS — D72829 Elevated white blood cell count, unspecified: Secondary | ICD-10-CM

## 2023-12-23 DIAGNOSIS — D649 Anemia, unspecified: Secondary | ICD-10-CM

## 2023-12-23 NOTE — Progress Notes (Signed)
Orders placed for f/u labs.  

## 2023-12-24 ENCOUNTER — Inpatient Hospital Stay: Payer: 59

## 2023-12-24 ENCOUNTER — Inpatient Hospital Stay: Payer: 59 | Attending: Internal Medicine

## 2023-12-24 ENCOUNTER — Other Ambulatory Visit (HOSPITAL_COMMUNITY): Payer: Self-pay

## 2023-12-24 ENCOUNTER — Telehealth: Payer: Self-pay

## 2023-12-24 ENCOUNTER — Inpatient Hospital Stay (HOSPITAL_BASED_OUTPATIENT_CLINIC_OR_DEPARTMENT_OTHER): Payer: 59 | Admitting: Internal Medicine

## 2023-12-24 ENCOUNTER — Encounter: Payer: Self-pay | Admitting: Internal Medicine

## 2023-12-24 VITALS — BP 138/98 | HR 85 | Temp 99.5°F | Resp 16 | Ht 68.0 in | Wt 309.8 lb

## 2023-12-24 DIAGNOSIS — R197 Diarrhea, unspecified: Secondary | ICD-10-CM | POA: Diagnosis not present

## 2023-12-24 DIAGNOSIS — E559 Vitamin D deficiency, unspecified: Secondary | ICD-10-CM | POA: Diagnosis not present

## 2023-12-24 DIAGNOSIS — Z08 Encounter for follow-up examination after completed treatment for malignant neoplasm: Secondary | ICD-10-CM | POA: Diagnosis not present

## 2023-12-24 DIAGNOSIS — D649 Anemia, unspecified: Secondary | ICD-10-CM | POA: Diagnosis not present

## 2023-12-24 DIAGNOSIS — E611 Iron deficiency: Secondary | ICD-10-CM | POA: Insufficient documentation

## 2023-12-24 DIAGNOSIS — D509 Iron deficiency anemia, unspecified: Secondary | ICD-10-CM

## 2023-12-24 DIAGNOSIS — Z85528 Personal history of other malignant neoplasm of kidney: Secondary | ICD-10-CM | POA: Insufficient documentation

## 2023-12-24 LAB — CBC WITH DIFFERENTIAL (CANCER CENTER ONLY)
Abs Immature Granulocytes: 0.12 10*3/uL — ABNORMAL HIGH (ref 0.00–0.07)
Basophils Absolute: 0.1 10*3/uL (ref 0.0–0.1)
Basophils Relative: 0 %
Eosinophils Absolute: 0.2 10*3/uL (ref 0.0–0.5)
Eosinophils Relative: 2 %
HCT: 37.9 % (ref 36.0–46.0)
Hemoglobin: 12.2 g/dL (ref 12.0–15.0)
Immature Granulocytes: 1 %
Lymphocytes Relative: 25 %
Lymphs Abs: 2.9 10*3/uL (ref 0.7–4.0)
MCH: 28.8 pg (ref 26.0–34.0)
MCHC: 32.2 g/dL (ref 30.0–36.0)
MCV: 89.6 fL (ref 80.0–100.0)
Monocytes Absolute: 0.7 10*3/uL (ref 0.1–1.0)
Monocytes Relative: 6 %
Neutro Abs: 7.7 10*3/uL (ref 1.7–7.7)
Neutrophils Relative %: 66 %
Platelet Count: 407 10*3/uL — ABNORMAL HIGH (ref 150–400)
RBC: 4.23 MIL/uL (ref 3.87–5.11)
RDW: 13.6 % (ref 11.5–15.5)
WBC Count: 11.6 10*3/uL — ABNORMAL HIGH (ref 4.0–10.5)
nRBC: 0 % (ref 0.0–0.2)

## 2023-12-24 LAB — BASIC METABOLIC PANEL WITH GFR
Anion gap: 11 (ref 5–15)
BUN: 12 mg/dL (ref 6–20)
CO2: 22 mmol/L (ref 22–32)
Calcium: 8.8 mg/dL — ABNORMAL LOW (ref 8.9–10.3)
Chloride: 101 mmol/L (ref 98–111)
Creatinine, Ser: 0.74 mg/dL (ref 0.44–1.00)
GFR, Estimated: >60 mL/min (ref >60)
Glucose, Bld: 117 mg/dL — ABNORMAL HIGH (ref 70–99)
Potassium: 3.9 mmol/L (ref 3.5–5.1)
Sodium: 134 mmol/L — ABNORMAL LOW (ref 135–145)

## 2023-12-24 LAB — IRON AND TIBC
Iron: 55 ug/dL (ref 28–170)
Saturation Ratios: 13 % (ref 10.4–31.8)
TIBC: 426 ug/dL (ref 250–450)
UIBC: 371 ug/dL

## 2023-12-24 LAB — VITAMIN B12: Vitamin B-12: 254 pg/mL (ref 180–914)

## 2023-12-24 LAB — FERRITIN: Ferritin: 43 ng/mL (ref 11–307)

## 2023-12-24 NOTE — Progress Notes (Signed)
  [facility] Center CONSULT NOTE  Patient Care Team: Dellar Fenton, MD as PCP - General (Internal Medicine) Gwyn Leos, MD as Consulting Physician (Oncology)  CHIEF COMPLAINTS/PURPOSE OF CONSULTATION: ANEMIA   HEMATOLOGY HISTORY  # ANEMIA[Hb; MCV-platelets- WBC; Iron  sat; ferritin;  GFR- CT/US - ;  EGD/colonoscopy-   Latest Reference Range & Units 11/29/22 13:57  Iron  42 - 145 ug/dL 38 (L)  TIBC 098.1 - 191.4 mcg/dL 782.9 (H)  Saturation Ratios 20.0 - 50.0 % 8.4 (L)  Ferritin 10.0 - 291.0 ng/mL 8.5 (L)  Transferrin 212.0 - 360.0 mg/dL 562.1  (L): Data is abnormally low (H): Data is abnormally high  HISTORY OF PRESENTING ILLNESS: Patient ambulating-independently.  Accompanied by family.    Lindsey Strickland 48 y.o.  female pleasant patient staging kidney cancer and also history of heavy menstrual cycles/iron  deficient anemia is here for follow-up.  In the interim  patient underwent iron  infusions.  Last infusion was in February.  She feels improved and also energy.  She is not on oral iron  because of GI upset/chronic diarrhea.   Review of Systems  Constitutional:  Positive for malaise/fatigue. Negative for chills, diaphoresis, fever and weight loss.  HENT:  Negative for nosebleeds and sore throat.   Eyes:  Negative for double vision.  Respiratory:  Positive for shortness of breath. Negative for cough, hemoptysis, sputum production and wheezing.   Cardiovascular:  Negative for chest pain, palpitations, orthopnea and leg swelling.  Gastrointestinal:  Negative for abdominal pain, blood in stool, constipation, diarrhea, heartburn, melena, nausea and vomiting.  Genitourinary:  Negative for dysuria, frequency and urgency.  Musculoskeletal:  Negative for back pain and joint pain.  Skin: Negative.  Negative for itching and rash.  Neurological:  Positive for dizziness. Negative for tingling, focal weakness, weakness and headaches.  Endo/Heme/Allergies:  Does not  bruise/bleed easily.  Psychiatric/Behavioral:  Negative for depression. The patient is not nervous/anxious and does not have insomnia.      MEDICAL HISTORY:  Past Medical History:  Diagnosis Date   Abnormal bleeding in menstrual cycle    Anemia    Atypical endometrial hyperplasia    Cancer (HCC)    Clear cell carcinoma of kidney (HCC)    Diabetes mellitus (HCC)    Diarrhea    Endometrial hyperplasia without atypia, complex    Environmental allergies    GAD (generalized anxiety disorder)    Generalized anxiety disorder    GERD (gastroesophageal reflux disease)    Hypercholesterolemia    Hypertension    Insomnia    Iron  deficiency anemia    Leukocytosis    Major depressive disorder    Numbness and tingling    Palpitations    Renal mass    Symptomatic anemia    Thrombocytosis    Type 2 diabetes mellitus without complications (HCC)    Vitamin D  deficiency     SURGICAL HISTORY: Past Surgical History:  Procedure Laterality Date   BIOPSY  09/30/2023   Procedure: BIOPSY;  Surgeon: Shane Darling, MD;  Location: [facility] ENDOSCOPY;  Service: Gastroenterology;;   COLONOSCOPY     COLONOSCOPY WITH PROPOFOL  N/A 05/28/2018   Procedure: COLONOSCOPY WITH PROPOFOL ;  Surgeon: Cassie Click, MD;  Location: [facility] ENDOSCOPY;  Service: Endoscopy;  Laterality: N/A;   COLONOSCOPY WITH PROPOFOL  N/A 09/30/2023   Procedure: COLONOSCOPY WITH PROPOFOL ;  Surgeon: Shane Darling, MD;  Location: [facility] ENDOSCOPY;  Service: Gastroenterology;  Laterality: N/A;   DILATION AND CURETTAGE OF UTERUS     ESOPHAGOGASTRODUODENOSCOPY (EGD) WITH  PROPOFOL  N/A 05/28/2018   Procedure: ESOPHAGOGASTRODUODENOSCOPY (EGD) WITH PROPOFOL ;  Surgeon: Cassie Click, MD;  Location: [facility] ENDOSCOPY;  Service: Endoscopy;  Laterality: N/A;   ESOPHAGOGASTRODUODENOSCOPY (EGD) WITH PROPOFOL  N/A 09/30/2023   Procedure: ESOPHAGOGASTRODUODENOSCOPY (EGD) WITH PROPOFOL ;  Surgeon: Shane Darling, MD;  Location: [facility]  ENDOSCOPY;  Service: Gastroenterology;  Laterality: N/A;   HYSTEROSCOPY WITH D & C     and polyp removal   POLYPECTOMY  09/30/2023   Procedure: POLYPECTOMY;  Surgeon: Shane Darling, MD;  Location: [facility] ENDOSCOPY;  Service: Gastroenterology;;    SOCIAL HISTORY: Social History   Socioeconomic History   Marital status: Single    Spouse name: Not on file   Number of children: 2   Years of education: Not on file   Highest education level: High school graduate  Occupational History    Employer: [employer]  Tobacco Use   Smoking status: Former    Current packs/day: 0.00    Types: Cigarettes    Quit date: 08/06/2000    Years since quitting: 23.3   Smokeless tobacco: Never  Vaping Use   Vaping status: Never Used  Substance and Sexual Activity   Alcohol use: Not Currently    Comment: rare   Drug use: No   Sexual activity: Yes    Birth control/protection: I.U.D.  Other Topics Concern   Not on file  Social History Narrative   Not on file   Social Drivers of Health   Financial Resource Strain: Low Risk  (10/31/2023)   Received from [facility]   Overall Financial Resource Strain (CARDIA)    Difficulty of Paying Living Expenses: Not hard at all  Food Insecurity: No Food Insecurity (10/31/2023)   Received from [facility]   Hunger Vital Sign    Worried About Running Out of Food in the Last Year: Never true    Ran Out of Food in the Last Year: Never true  Transportation Needs: No Transportation Needs (10/31/2023)   Received from [facility] - Transportation    Lack of Transportation (Medical): No    Lack of Transportation (Non-Medical): No  Physical Activity: Insufficiently Active (10/25/2023)   Exercise Vital Sign    Days of Exercise per Week: 1 day    Minutes of Exercise per Session: 10 min  Stress: Stress Concern Present (10/25/2023)   [employer] of Occupational Health - Occupational Stress Questionnaire    Feeling of Stress : Very much   Social Connections: Socially Isolated (10/25/2023)   Social Connection and Isolation Panel [NHANES]    Frequency of Communication with Friends and Family: Once a week    Frequency of Social Gatherings with Friends and Family: Never    Attends Religious Services: Never    Database administrator or Organizations: No    Attends Engineer, structural: Not on file    Marital Status: Never married  Intimate Partner Violence: Not At Risk (01/21/2023)   Humiliation, Afraid, Rape, and Kick questionnaire    Fear of Current or Ex-Partner: No    Emotionally Abused: No    Physically Abused: No    Sexually Abused: No    FAMILY HISTORY: Family History  Problem Relation Age of Onset   Hypertension Mother    Diabetes Mother    Diverticulosis Mother    Depression Mother    Hypercholesterolemia Father    Hypertension Father    Cancer Father    Heart disease Maternal Grandfather  Diverticulosis Paternal Grandmother    Hyperlipidemia Paternal Grandmother    Heart failure Paternal Grandmother    Cancer Maternal Grandmother    Alcoholism Paternal Uncle     ALLERGIES:  is allergic to tetracycline, augmentin [amoxicillin-pot clavulanate], citalopram, hctz [hydrochlorothiazide], lisinopril, other, prednisone, sertraline, ciprofloxacin , and levofloxacin .  MEDICATIONS:  Current Outpatient Medications  Medication Sig Dispense Refill   albuterol  (VENTOLIN  HFA) 108 (90 Base) MCG/ACT inhaler Inhale 2 puffs into the lungs every 6 (six) hours as needed for wheezing. Patient request ProAir  brand only. 18 g 2   amLODipine  (NORVASC ) 10 MG tablet Take 1/2 (one-half) tablet by mouth twice daily 90 tablet 0   Cholecalciferol (VITAMIN D3) 50 MCG (2000 UT) capsule Take 2,000 Units by mouth daily.     Cranberry (RA CRANBERRY) 500 MG CAPS Take 500 mg by mouth daily.     D-Mannose 500 MG CAPS Take 500 mg by mouth in the morning, at noon, and at bedtime.     famotidine (PEPCID) 20 MG tablet TAKE 2 TABLETS  (40 MG) BY MOUTH NIGHTLY     fluticasone  (FLONASE ) 50 MCG/ACT nasal spray Place 1 spray into both nostrils daily. 48 g 1   glucose blood test strip Use as directed to check sugars BID. Dx E11.9 100 each 12   Lancets MISC by Does not apply route. Use 1 lancets three times a day     levonorgestrel (MIRENA) 20 MCG/DAY IUD by Intrauterine route.     loratadine (CLARITIN) 10 MG tablet Take 10 mg by mouth daily.     losartan  (COZAAR ) 100 MG tablet Take 1 tablet by mouth once daily 90 tablet 0   metFORMIN  (GLUCOPHAGE ) 500 MG tablet Take 2 tablets (1,000 mg total) by mouth 2 (two) times daily. 360 tablet 1   metoprolol  succinate (TOPROL -XL) 100 MG 24 hr tablet Take 0.5 tablets (50 mg total) by mouth 2 (two) times daily. Take with or immediately following a meal. 90 tablet 2   Multiple Vitamin (MULTIVITAMIN) tablet Take 1 tablet by mouth daily.     pravastatin  (PRAVACHOL ) 40 MG tablet Take 1 tablet by mouth once daily 90 tablet 0   Probiotic Product (FEM-DOPHILUS WOMENS PO) Take 1 tablet by mouth daily.     Vilazodone  HCl (VIIBRYD ) 10 MG TABS Take 5 mg by mouth daily.     Semaglutide ,0.25 or 0.5MG /DOS, (OZEMPIC , 0.25 OR 0.5 MG/DOSE,) 2 MG/3ML SOPN Inject 0.25 mg into the skin once a week. (Patient not taking: Reported on 12/24/2023) 3 mL 2   No current facility-administered medications for this visit.     PHYSICAL EXAMINATION:   Vitals:   12/24/23 1506 12/24/23 1528  BP: (!) 146/90 (!) 138/98  Pulse: 85   Resp: 16   Temp: 99.5 F (37.5 C)   SpO2: 99%      Filed Weights   12/24/23 1506  Weight: (!) 309 lb 12.8 oz (140.5 kg)      Physical Exam Vitals and nursing note reviewed.  HENT:     Head: Normocephalic and atraumatic.     Mouth/Throat:     Pharynx: Oropharynx is clear.  Eyes:     Extraocular Movements: Extraocular movements intact.     Pupils: Pupils are equal, round, and reactive to light.  Cardiovascular:     Rate and Rhythm: Normal rate and regular rhythm.  Pulmonary:      Comments: Decreased breath sounds bilaterally.  Abdominal:     Palpations: Abdomen is soft.  Musculoskeletal:  General: Normal range of motion.     Cervical back: Normal range of motion.  Skin:    General: Skin is warm.  Neurological:     General: No focal deficit present.     Mental Status: She is alert and oriented to person, place, and time.  Psychiatric:        Behavior: Behavior normal.        Judgment: Judgment normal.      LABORATORY DATA:  I have reviewed the data as listed Lab Results  Component Value Date   WBC 11.6 (H) 12/24/2023   HGB 12.2 12/24/2023   HCT 37.9 12/24/2023   MCV 89.6 12/24/2023   PLT 407 (H) 12/24/2023   Recent Labs    06/18/23 1538 07/25/23 1029 07/26/23 1203 09/17/23 1451 12/20/23 1223 12/24/23 1505  NA 137  --    < > 135 136 134*  K 4.5  --    < > 4.1 4.0 3.9  CL 99  --    < > 101 100 101  CO2 24  --    < > 23 26 22   GLUCOSE 129*  --    < > 117* 119* 117*  BUN 12  --    < > 17 12 12   CREATININE 0.70  --    < > 0.56 0.58 0.74  CALCIUM 9.4  --    < > 9.1 9.2 8.8*  GFRNONAA >60  --   --  >60  --  >60  PROT  --  7.0  --   --  7.3  --   ALBUMIN  --  4.2  --   --  4.3  --   AST  --  11  --   --  14  --   ALT  --  15  --   --  19  --   ALKPHOS  --  68  --   --  63  --   BILITOT  --  0.3  --   --  0.3  --   BILIDIR  --  0.0  --   --  0.1  --    < > = values in this interval not displayed.     No results found.  ASSESSMENT & PLAN:   Symptomatic anemia # Anemia- Hb- 10.5; Ferritin- 8.5 [PCP- APRIL 2024] symptomatic.  Likely due to iron  deficiency -menorrhagia.  Poor tolerance/lack of improvement on oral iron .    # Today hemoglobin 12.2-  HOLD infusion. Not on iron  sec to chronic mild diarrhea-see below. consider iron  next visit  # Etiology of iron  deficiency: s/p IUD-Heavy  Menstrual cycles- Amy Bryant; GYN [[facility]]- colo 2019- internal bleed [[facility]] - stable.   # Hx of right RCC [incidental- Dr.raynor- [place] ] s/p partial  nephrectomy- CT- [ Oct 2024- [facility]]; s/p  staghorn stone extraction.   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 The patient knows to call the clinic with any problems, questions or concerns.    Gwyn Leos, MD 12/24/2023 4:01 PM

## 2023-12-24 NOTE — Telephone Encounter (Signed)
 Pharmacy Patient Advocate Encounter  Received notification from [facility] that Prior Authorization for Ozempic  (0.25 or 0.5 MG/DOSE) 2MG /3ML pen-injectors has been APPROVED from 12/24/2023 to 12/24/2026. Ran test claim, Copay is $0.00. This test claim was processed through [facility]- copay amounts may vary at other pharmacies due to pharmacy/plan contracts, or as the patient moves through the different stages of their insurance plan.   PA #/Case ID/Reference #: Key: [id number]

## 2023-12-24 NOTE — Telephone Encounter (Signed)
 Pharmacy Patient Advocate Encounter   Received notification from CoverMyMeds that prior authorization for Ozempic  (0.25 or 0.5 MG/DOSE) 2MG /3ML pen-injectors is required/requested.   Insurance verification completed.   The patient is insured through [facility] .   Per test claim: PA required; PA submitted to above mentioned insurance via CoverMyMeds Key/confirmation #/EOC [id number] Status is pending

## 2023-12-24 NOTE — Assessment & Plan Note (Addendum)
#   Anemia- Hb- 10.5; Ferritin- 8.5 [PCP- APRIL 2024] symptomatic.  Likely due to iron  deficiency -menorrhagia.  Poor tolerance/lack of improvement on oral iron .    # Today hemoglobin 12.2-  HOLD infusion. Not on iron  sec to chronic mild diarrhea-see below. consider iron  next visit  # Etiology of iron  deficiency: s/p IUD-Heavy  Menstrual cycles- Amy Bryant; GYN [[facility]]- colo 2019- internal bleed [[facility]] - stable.   # Hx of right RCC [incidental- Dr.raynor- [place] ] s/p partial nephrectomy- CT- [ Oct 2024- [facility]]; s/p  staghorn stone extraction.   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

## 2023-12-24 NOTE — Progress Notes (Signed)
 She is having a work up for copd/lung, seeing Dr. Jamal Mays. No SOB.  10/31/23 PCNL-kidney stone removal, at [facility] urology.  11/01/23 Splenic artery aneurism CT done at [facility].  Fatigue/weakness: NO Dyspena: NO Light headedness: NO  Blood in stool: NO

## 2023-12-30 ENCOUNTER — Encounter: Payer: Self-pay | Admitting: Internal Medicine

## 2023-12-30 NOTE — Assessment & Plan Note (Signed)
 Evaluated [facility] - recurring UTIs. Had previously recommended topical estrogen and hydration. There was concern that stones may be contributing some to recurring infections.  Continue f/u with urology. Recent procedures as outlined. Hold on SGLT2 inhibitors. Follow. No current symptoms.

## 2023-12-30 NOTE — Assessment & Plan Note (Signed)
 Continue losartan , metoprolol  and amlodipine .  Follow pressures. No changes in medication today. Blood pressure improved.

## 2023-12-30 NOTE — Assessment & Plan Note (Signed)
 Has been evaluated by hematology.  Felt to be reactive.  Receiving iron  infusions. Continue f/u with hematology.

## 2023-12-30 NOTE — Assessment & Plan Note (Addendum)
 Is s/p left percutaneous nephrostolithotomy/pyelostolithotomy w/wo dilat, endoscop, litho, stent (10/2023). Did well with surgery. Post op CT - resolution of large staghorn calculus and remaining small non obstructing left renal stones. Discharged on bactrim. 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 Recommended f/u CTA of abdomen in one year. 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 RUS 5/13 - small kidney stone in the right kidney (4mm). Recommended f/u CT in 5 months. Recommended to discontinue tamsulosin and solifenacin. Recommended f/u CT and CXR 05/2024 - RCC surveillance.

## 2023-12-30 NOTE — Assessment & Plan Note (Signed)
Overall appears to be handling things relatively well.  Follow.   

## 2023-12-30 NOTE — Assessment & Plan Note (Deleted)
 Is s/p left percutaneous nephrostolithotomy/pyelostolithotomy w/wo dilat, endoscop, litho, stent (10/2023). Did well with surgery. Post op CT - resolution of large staghorn calculus and remaining small non obstructing left renal stones. Discharged on bactrim. 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 Recommended f/u CTA of abdomen in one year.

## 2023-12-30 NOTE — Assessment & Plan Note (Signed)
 Continue prilosec. No upper symptoms reported.

## 2023-12-30 NOTE — Assessment & Plan Note (Signed)
 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 RUS 5/13 - small kidney stone in the right kidney (4mm). Recommended f/u CT in 5 months. Recommended to discontinue tamsulosin and solifenacin. Recommended f/u CT and CXR 05/2024 - RCC surveillance.

## 2023-12-30 NOTE — Assessment & Plan Note (Signed)
 Continue diet and exercise. Start ozempic  as outlined.

## 2023-12-30 NOTE — Assessment & Plan Note (Signed)
 Found incidentally on recent scan. Discussed having [facility] evaluate and give recommendations for follow up (surveillance).

## 2023-12-30 NOTE — Assessment & Plan Note (Signed)
 Continue pravastatin .  Diet and exercise.  Had previously discussed changing to crestor.  Had previously wanted to hold on change. Follow lipid panel. Continue diet and exercise.

## 2023-12-30 NOTE — Assessment & Plan Note (Signed)
 Low carb diet and exercise.  On metformin .  Appears that diarrhea is better off the medication. Have discussed other treatment options as outlined.  Has previously wanted to hold on changing medication. Ready now. Will stop metformin . Start ozempic  .25mg  weekly. Discussed contraindications and possible side effects of medication. Continue low carb diet and exercise. Follow met b and A1c.

## 2024-01-03 ENCOUNTER — Ambulatory Visit

## 2024-01-03 NOTE — Progress Notes (Addendum)
 Teaching given on Ozempic  0.25 MG. Patient administered injection into her Left Lower side of Abdomen. Patient watched teaching Ozempic  video via Ozempic  website twice with Virgina Grills, CMA in the room present. Patient waited to make sure she tolerated injection well without any injections.

## 2024-01-07 ENCOUNTER — Encounter: Payer: Self-pay | Admitting: Internal Medicine

## 2024-01-12 ENCOUNTER — Other Ambulatory Visit: Payer: Self-pay | Admitting: Internal Medicine

## 2024-02-08 ENCOUNTER — Encounter: Payer: Self-pay | Admitting: Internal Medicine

## 2024-02-10 NOTE — Telephone Encounter (Signed)
 Okay to send extra needles for pt ?

## 2024-02-10 NOTE — Telephone Encounter (Signed)
 See me before calling Lindsey Strickland regarding her question about the pens.

## 2024-02-10 NOTE — Telephone Encounter (Signed)
 Sorry to bother you. Had a question. I have not had a pt call in with this concern previously. She is on ozempic .25mg  weekly. Rx sent in for 3ml. She only received 6. Will insurance cover and is there something different I need to do.

## 2024-02-11 NOTE — Telephone Encounter (Signed)
 Informed pt we have pen needles for her in office. Pt will get at her appt on Friday

## 2024-02-11 NOTE — Telephone Encounter (Signed)
 Spoke to pt. Pt stated that she doesn't want to increase her dosage. Pt stated that she is doing well with the 0.25mg  and would like extra needles sent in. Informed pt insurance may not cover and to let us  know if she runs into any issues. Pen needles have been pended for approval

## 2024-02-14 ENCOUNTER — Ambulatory Visit: Admitting: Internal Medicine

## 2024-02-14 VITALS — BP 112/72 | HR 82 | Temp 98.0°F | Resp 16 | Ht 68.0 in | Wt 307.6 lb

## 2024-02-14 DIAGNOSIS — K219 Gastro-esophageal reflux disease without esophagitis: Secondary | ICD-10-CM

## 2024-02-14 DIAGNOSIS — F439 Reaction to severe stress, unspecified: Secondary | ICD-10-CM

## 2024-02-14 DIAGNOSIS — I10 Essential (primary) hypertension: Secondary | ICD-10-CM | POA: Diagnosis not present

## 2024-02-14 DIAGNOSIS — Z0184 Encounter for antibody response examination: Secondary | ICD-10-CM

## 2024-02-14 DIAGNOSIS — E1165 Type 2 diabetes mellitus with hyperglycemia: Secondary | ICD-10-CM | POA: Diagnosis not present

## 2024-02-14 DIAGNOSIS — Z7985 Long-term (current) use of injectable non-insulin antidiabetic drugs: Secondary | ICD-10-CM

## 2024-02-14 DIAGNOSIS — I728 Aneurysm of other specified arteries: Secondary | ICD-10-CM

## 2024-02-14 DIAGNOSIS — D75839 Thrombocytosis, unspecified: Secondary | ICD-10-CM

## 2024-02-14 DIAGNOSIS — E7800 Pure hypercholesterolemia, unspecified: Secondary | ICD-10-CM

## 2024-02-14 DIAGNOSIS — N8501 Benign endometrial hyperplasia: Secondary | ICD-10-CM

## 2024-02-14 DIAGNOSIS — C649 Malignant neoplasm of unspecified kidney, except renal pelvis: Secondary | ICD-10-CM

## 2024-02-14 DIAGNOSIS — J3489 Other specified disorders of nose and nasal sinuses: Secondary | ICD-10-CM

## 2024-02-14 NOTE — Progress Notes (Unsigned)
 Subjective:    Patient ID: Lindsey Strickland, female    DOB: Mar 12, 1976, 47 y.o.   MRN: [id number]  Patient here for  Chief Complaint  Patient presents with   Medical Management of Chronic Issues    HPI Here for a scheduled follow up - follow up regarding diabetes, hypertension and hypercholesterolemia.  She is accompanied by her husband. History obtained from both of them. 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 RUS 5/13 - small kidney stone in the right kidney (4mm). Recommended f/u CT in 5 months. Recommended to discontinue tamsulosin and solifenacin. Recommended f/u CT and CXR 05/2024 - RCC surveillance. Recently started on ozempic . Metformin  - contributing to diarrhea. Saw Dr Rennie - 12/24/23 - anemia. Hgb 12.2. improved. Hold iron  infusion. Saw gyn - 01/07/24 - pap performed. Discussed hysterectomy - endometrial hyperplasia. Referred to urogyn - stress incontinence. Saw Dr Maree - 01/22/24 - visual disturbance, migraine - recommended mag glycinate. Pulmonary 01/22/24 - continue cpap. Weight loss. Saw GI 02/06/24 - diarrhea felt to be related to metformin .    Past Medical History:  Diagnosis Date   Abnormal bleeding in menstrual cycle    Anemia    Atypical endometrial hyperplasia    Cancer (HCC)    Clear cell carcinoma of kidney (HCC)    Diabetes mellitus (HCC)    Diarrhea    Endometrial hyperplasia without atypia, complex    Environmental allergies    GAD (generalized anxiety disorder)    Generalized anxiety disorder    GERD (gastroesophageal reflux disease)    Hypercholesterolemia    Hypertension    Insomnia    Iron  deficiency anemia    Leukocytosis    Major depressive disorder    Numbness and tingling    Palpitations    Renal mass    Symptomatic anemia    Thrombocytosis    Type 2 diabetes mellitus without complications (HCC)    Vitamin D  deficiency    Past Surgical History:  Procedure Laterality Date   BIOPSY  09/30/2023    Procedure: BIOPSY;  Surgeon: Maryruth Ole DASEN, MD;  Location: [facility] ENDOSCOPY;  Service: Gastroenterology;;   COLONOSCOPY     COLONOSCOPY WITH PROPOFOL  N/A 05/28/2018   Procedure: COLONOSCOPY WITH PROPOFOL ;  Surgeon: Viktoria Lamar DASEN, MD;  Location: [facility] ENDOSCOPY;  Service: Endoscopy;  Laterality: N/A;   COLONOSCOPY WITH PROPOFOL  N/A 09/30/2023   Procedure: COLONOSCOPY WITH PROPOFOL ;  Surgeon: Maryruth Ole DASEN, MD;  Location: [facility] ENDOSCOPY;  Service: Gastroenterology;  Laterality: N/A;   DILATION AND CURETTAGE OF UTERUS     ESOPHAGOGASTRODUODENOSCOPY (EGD) WITH PROPOFOL  N/A 05/28/2018   Procedure: ESOPHAGOGASTRODUODENOSCOPY (EGD) WITH PROPOFOL ;  Surgeon: Viktoria Lamar DASEN, MD;  Location: [facility] ENDOSCOPY;  Service: Endoscopy;  Laterality: N/A;   ESOPHAGOGASTRODUODENOSCOPY (EGD) WITH PROPOFOL  N/A 09/30/2023   Procedure: ESOPHAGOGASTRODUODENOSCOPY (EGD) WITH PROPOFOL ;  Surgeon: Maryruth Ole DASEN, MD;  Location: [facility] ENDOSCOPY;  Service: Gastroenterology;  Laterality: N/A;   HYSTEROSCOPY WITH D & C     and polyp removal   POLYPECTOMY  09/30/2023   Procedure: POLYPECTOMY;  Surgeon: Maryruth Ole DASEN, MD;  Location: [facility] ENDOSCOPY;  Service: Gastroenterology;;   Family History  Problem Relation Age of Onset   Hypertension Mother    Diabetes Mother    Diverticulosis Mother    Depression Mother    Hypercholesterolemia Father    Hypertension Father    Cancer Father    Heart disease Maternal Grandfather    Diverticulosis Paternal Grandmother  Hyperlipidemia Paternal Grandmother    Heart failure Paternal Grandmother    Cancer Maternal Grandmother    Alcoholism Paternal Uncle    Social History   Socioeconomic History   Marital status: Single    Spouse name: Not on file   Number of children: 2   Years of education: Not on file   Highest education level: Associate degree: academic program  Occupational History    Employer: [employer]  Tobacco Use   Smoking status: Former     Current packs/day: 0.00    Types: Cigarettes    Quit date: 08/06/2000    Years since quitting: 23.5   Smokeless tobacco: Never  Vaping Use   Vaping status: Never Used  Substance and Sexual Activity   Alcohol use: Not Currently    Comment: rare   Drug use: No   Sexual activity: Yes    Birth control/protection: I.U.D.  Other Topics Concern   Not on file  Social History Narrative   Not on file   Social Drivers of Health   Financial Resource Strain: Low Risk  (02/14/2024)   Overall Financial Resource Strain (CARDIA)    Difficulty of Paying Living Expenses: Not hard at all  Food Insecurity: No Food Insecurity (02/14/2024)   Hunger Vital Sign    Worried About Running Out of Food in the Last Year: Never true    Ran Out of Food in the Last Year: Never true  Transportation Needs: No Transportation Needs (02/14/2024)   PRAPARE - Administrator, Civil Service (Medical): No    Lack of Transportation (Non-Medical): No  Physical Activity: Insufficiently Active (02/14/2024)   Exercise Vital Sign    Days of Exercise per Week: 1 day    Minutes of Exercise per Session: 20 min  Stress: Stress Concern Present (02/14/2024)   [employer] of Occupational Health - Occupational Stress Questionnaire    Feeling of Stress: Very much  Social Connections: Unknown (02/14/2024)   Social Connection and Isolation Panel    Frequency of Communication with Friends and Family: Not on file    Frequency of Social Gatherings with Friends and Family: Not on file    Attends Religious Services: Not on file    Active Member of Clubs or Organizations: No    Attends Engineer, structural: Not on file    Marital Status: Not on file     Review of Systems     Objective:     BP 112/72   Pulse 82   Temp 98 F (36.7 C)   Resp 16   Ht 5' 8 (1.727 m)   Wt (!) 307 lb 9.6 oz (139.5 kg)   SpO2 99%   BMI 46.77 kg/m  Wt Readings from Last 3 Encounters:  02/14/24 (!) 307 lb 9.6 oz (139.5  kg)  12/24/23 (!) 309 lb 12.8 oz (140.5 kg)  12/20/23 (!) 308 lb 6 oz (139.9 kg)    Physical Exam  {Perform Simple Foot Exam  Perform Detailed exam:1} {Insert foot Exam (Optional):30965}   Outpatient Encounter Medications as of 02/14/2024  Medication Sig   albuterol  (VENTOLIN  HFA) 108 (90 Base) MCG/ACT inhaler Inhale 2 puffs into the lungs every 6 (six) hours as needed for wheezing. Patient request ProAir  brand only.   amLODipine  (NORVASC ) 10 MG tablet Take 1/2 (one-half) tablet by mouth twice daily   Cholecalciferol (VITAMIN D3) 50 MCG (2000 UT) capsule Take 2,000 Units by mouth daily.   Cranberry (RA CRANBERRY) 500 MG CAPS  Take 500 mg by mouth daily.   D-Mannose 500 MG CAPS Take 500 mg by mouth in the morning, at noon, and at bedtime.   famotidine (PEPCID) 20 MG tablet TAKE 2 TABLETS (40 MG) BY MOUTH NIGHTLY   fluticasone  (FLONASE ) 50 MCG/ACT nasal spray Place 1 spray into both nostrils daily.   glucose blood test strip Use as directed to check sugars BID. Dx E11.9   Lancets MISC by Does not apply route. Use 1 lancets three times a day   levonorgestrel (MIRENA) 20 MCG/DAY IUD by Intrauterine route.   loratadine (CLARITIN) 10 MG tablet Take 10 mg by mouth daily.   losartan  (COZAAR ) 100 MG tablet Take 1 tablet by mouth once daily   metoprolol  succinate (TOPROL -XL) 100 MG 24 hr tablet TAKE ONE-HALF TABLET BY MOUTH TWICE DAILY. TAKE WITH OR IMMEDIATELY FOLLOWING A MEAL.   Multiple Vitamin (MULTIVITAMIN) tablet Take 1 tablet by mouth daily.   pravastatin  (PRAVACHOL ) 40 MG tablet Take 1 tablet by mouth once daily   Probiotic Product (FEM-DOPHILUS WOMENS PO) Take 1 tablet by mouth daily.   Semaglutide ,0.25 or 0.5MG /DOS, (OZEMPIC , 0.25 OR 0.5 MG/DOSE,) 2 MG/3ML SOPN Inject 0.25 mg into the skin once a week.   solifenacin (VESICARE) 5 MG tablet Take 5 mg by mouth daily.   tamsulosin (FLOMAX) 0.4 MG CAPS capsule Take 0.4 mg by mouth daily.   Vilazodone  HCl (VIIBRYD ) 10 MG TABS Take 5 mg by  mouth daily. (Patient taking differently: Take 10 mg by mouth daily.)   [DISCONTINUED] metFORMIN  (GLUCOPHAGE ) 500 MG tablet Take 2 tablets (1,000 mg total) by mouth 2 (two) times daily.   No facility-administered encounter medications on file as of 02/14/2024.     Lab Results  Component Value Date   WBC 11.6 (H) 12/24/2023   HGB 12.2 12/24/2023   HCT 37.9 12/24/2023   PLT 407 (H) 12/24/2023   GLUCOSE 117 (H) 12/24/2023   CHOL 179 12/20/2023   TRIG 310.0 (H) 12/20/2023   HDL 41.50 12/20/2023   LDLDIRECT 132.0 11/02/2020   LDLCALC 76 12/20/2023   ALT 19 12/20/2023   AST 14 12/20/2023   NA 134 (L) 12/24/2023   K 3.9 12/24/2023   CL 101 12/24/2023   CREATININE 0.74 12/24/2023   BUN 12 12/24/2023   CO2 22 12/24/2023   TSH 3.55 07/25/2023   INR 0.93 05/05/2018   HGBA1C 7.5 (H) 12/20/2023    No results found.     Assessment & Plan:  There are no diagnoses linked to this encounter.   Allena Hamilton, MD

## 2024-02-16 ENCOUNTER — Ambulatory Visit: Payer: Self-pay | Admitting: Internal Medicine

## 2024-02-16 LAB — MEASLES/MUMPS/RUBELLA IMMUNITY
Mumps IgG: 23.7 [AU]/ml
Rubella: 17.8 {index}
Rubeola IgG: <13.5 [AU]/ml — ABNORMAL LOW

## 2024-02-18 ENCOUNTER — Encounter: Payer: Self-pay | Admitting: Internal Medicine

## 2024-02-18 MED ORDER — MUPIROCIN 2 % EX OINT
1.0000 | TOPICAL_OINTMENT | Freq: Two times a day (BID) | CUTANEOUS | 0 refills | Status: DC
Start: 2024-02-18 — End: 2024-04-29

## 2024-02-18 NOTE — Telephone Encounter (Signed)
 I have sent in the bactroban  ointment. Also, regarding the MMR vaccine, I wanted her to let them know that she did not have immunity and would be getting the MMR - for their information.

## 2024-02-19 ENCOUNTER — Encounter: Payer: Self-pay | Admitting: Internal Medicine

## 2024-02-19 DIAGNOSIS — J3489 Other specified disorders of nose and nasal sinuses: Secondary | ICD-10-CM | POA: Insufficient documentation

## 2024-02-19 NOTE — Assessment & Plan Note (Signed)
 Found incidentally on recent scan. Have previously discussed having [facility] evaluate and give recommendations for follow up (surveillance).

## 2024-02-19 NOTE — Assessment & Plan Note (Signed)
 Seeing psychiatry. On viibryd . Appears to be stable.

## 2024-02-19 NOTE — Assessment & Plan Note (Signed)
 Continue pravastatin .  Diet and exercise.  Had previously discussed changing to crestor.  Had previously wanted to hold on change. Follow lipid panel. Continue diet and exercise.  Lab Results  Component Value Date   CHOL 179 12/20/2023   HDL 41.50 12/20/2023   LDLCALC 76 12/20/2023   LDLDIRECT 132.0 11/02/2020   TRIG 310.0 (H) 12/20/2023   CHOLHDL 4 12/20/2023

## 2024-02-19 NOTE — Telephone Encounter (Signed)
 Noted

## 2024-02-19 NOTE — Assessment & Plan Note (Signed)
 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 RUS 5/13 - small kidney stone in the right kidney (4mm). Recommended f/u CT in 5 months. Recommended to discontinue tamsulosin and solifenacin. Recommended f/u CT and CXR 05/2024 - RCC surveillance.

## 2024-02-19 NOTE — Assessment & Plan Note (Signed)
 Nasal lesions/facial lesion - refer to dermatology given persistence. Nasal lesion - bactroban .

## 2024-02-19 NOTE — Assessment & Plan Note (Signed)
 Saw gyn - 01/07/24 - pap performed. Discussed hysterectomy - endometrial hyperplasia. Per note, referred to urogyn - stress incontinence.

## 2024-02-19 NOTE — Assessment & Plan Note (Signed)
 Has been evaluated by hematology.  Felt to be reactive.  Receiving iron  infusions. Continue f/u with hematology.

## 2024-02-19 NOTE — Assessment & Plan Note (Signed)
 Continue prilosec. No upper symptoms reported.

## 2024-02-19 NOTE — Assessment & Plan Note (Signed)
 Diet and exercise. Taking low dose ozempic . Just started. Appears to be tolerating relatively well. Follow.

## 2024-02-19 NOTE — Assessment & Plan Note (Signed)
 Low carb diet and exercise.  Off metformin  now. Appears that diarrhea is better off the medication.  Now on ozempic  .25mg  weekly. Appears to be tolerating relatively well. Discussed importance of keeping bowels moving. Miralax as directed if needed. Continue low carb diet and exercise. Follow met b and A1c.

## 2024-02-19 NOTE — Assessment & Plan Note (Signed)
 Continue losartan , metoprolol  and amlodipine .  Follow pressures. No changes in medication today. Blood pressure as outlined. Spot check pressures.

## 2024-03-08 ENCOUNTER — Other Ambulatory Visit: Payer: Self-pay | Admitting: Internal Medicine

## 2024-03-10 ENCOUNTER — Encounter (INDEPENDENT_AMBULATORY_CARE_PROVIDER_SITE_OTHER): Payer: Self-pay | Admitting: Vascular Surgery

## 2024-03-16 ENCOUNTER — Encounter: Payer: Self-pay | Admitting: Internal Medicine

## 2024-03-16 NOTE — Telephone Encounter (Signed)
 We only carry the MMR in our office. Does the pt need the MMR vaccine or just the measles? If she needs just the measles she will have to go to the health dept.

## 2024-03-16 NOTE — Telephone Encounter (Signed)
 MMR. Recommendation - MMR and repeat x 1 after 28 days

## 2024-03-16 NOTE — Telephone Encounter (Signed)
 Does patient have a order for a MMR  Copied from CRM #[id number]. Topic: Appointments - Scheduling Inquiry for Clinic >> Mar 16, 2024  9:56 AM Lindsey Strickland wrote: Reason for CRM: Patient calling to schedule a nurse visit to get the MMR.

## 2024-03-17 NOTE — Telephone Encounter (Signed)
 Pt scheduled for first MMR vaccine.

## 2024-03-19 ENCOUNTER — Ambulatory Visit (INDEPENDENT_AMBULATORY_CARE_PROVIDER_SITE_OTHER)

## 2024-03-19 DIAGNOSIS — Z23 Encounter for immunization: Secondary | ICD-10-CM

## 2024-03-19 NOTE — Progress Notes (Signed)
 Patient is in office today for a nurse visit for MMR. Patient Injection was given in the  Right deltoid. Patient tolerated injection well.

## 2024-03-25 ENCOUNTER — Inpatient Hospital Stay

## 2024-03-25 ENCOUNTER — Encounter: Payer: Self-pay | Admitting: Internal Medicine

## 2024-03-25 ENCOUNTER — Inpatient Hospital Stay: Admitting: Internal Medicine

## 2024-03-26 NOTE — Telephone Encounter (Signed)
 I am ok with her increasing to .5mg  if she is taking the .25mg  and tolerating. Please confirm if she needs new rx and which pharmacy.

## 2024-04-07 ENCOUNTER — Other Ambulatory Visit: Payer: Self-pay

## 2024-04-07 MED ORDER — OZEMPIC (0.25 OR 0.5 MG/DOSE) 2 MG/3ML ~~LOC~~ SOPN: 0.5000 mg | PEN_INJECTOR | SUBCUTANEOUS | 2 refills | Status: DC

## 2024-04-12 ENCOUNTER — Other Ambulatory Visit: Payer: Self-pay | Admitting: Internal Medicine

## 2024-04-14 ENCOUNTER — Ambulatory Visit (INDEPENDENT_AMBULATORY_CARE_PROVIDER_SITE_OTHER): Admitting: Vascular Surgery

## 2024-04-14 VITALS — BP 157/93 | HR 92 | Resp 18 | Ht 68.0 in | Wt 311.6 lb

## 2024-04-14 DIAGNOSIS — I10 Essential (primary) hypertension: Secondary | ICD-10-CM

## 2024-04-14 DIAGNOSIS — E1165 Type 2 diabetes mellitus with hyperglycemia: Secondary | ICD-10-CM | POA: Diagnosis not present

## 2024-04-14 DIAGNOSIS — I728 Aneurysm of other specified arteries: Secondary | ICD-10-CM | POA: Diagnosis not present

## 2024-04-14 NOTE — Assessment & Plan Note (Signed)
 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 She has another scan in about 2 months for her renal cancer. I will see her back in 3 months to review.  I had a long discussion with the patient and her husband today about the pathophysiology and natural history of splenic artery aneurysms.  It small at this time and does not need repaired.  It will need to be monitored regularly with either duplex or CT scan.  She is going to be getting CT scans for her renal cancer.  That will be a perfectly adequate way to follow this.  As long as she does not have any pregnancy planned, it does not need repair until it reaches at least 2 cm in diameter.

## 2024-04-14 NOTE — Assessment & Plan Note (Signed)
 blood glucose control important in reducing the progression of atherosclerotic disease. Also, involved in wound healing. On appropriate medications.

## 2024-04-14 NOTE — Assessment & Plan Note (Signed)
 blood pressure control important in reducing the progression of atherosclerotic disease. On appropriate oral medications.

## 2024-04-14 NOTE — Progress Notes (Signed)
 Patient ID: Lindsey Strickland, female   DOB: 1976-06-07, 48 y.o.   MRN: [id number]  Chief Complaint  Patient presents with   New Patient (Initial Visit)    Ref Glendia consult splenic artery aneurysm     HPI Lindsey Strickland is a 48 y.o. female.  I am asked to see the patient by Dr. Glendia for evaluation of splenic artery aneurysm.  This was an incidental finding on the CT scan of the abdomen pelvis performed in March of this year for urologic issues.  This was done at [facility] so I cannot see the images but have the result for review.  This was reported as a 1 cm splenic artery aneurysm.  Comment was not made on this location whether it was near the hilum of the spleen or the more proximal splenic artery. She has no plans for further pregnancy. She has no aneurysm related symptoms such as abdominal pain or flank pain.     Past Medical History:  Diagnosis Date   Abnormal bleeding in menstrual cycle    Anemia    Atypical endometrial hyperplasia    Cancer (HCC)    Clear cell carcinoma of kidney (HCC)    Diabetes mellitus (HCC)    Diarrhea    Endometrial hyperplasia without atypia, complex    Environmental allergies    GAD (generalized anxiety disorder)    Generalized anxiety disorder    GERD (gastroesophageal reflux disease)    Hypercholesterolemia    Hypertension    Insomnia    Iron  deficiency anemia    Leukocytosis    Major depressive disorder    Numbness and tingling    Palpitations    Renal mass    Symptomatic anemia    Thrombocytosis    Type 2 diabetes mellitus without complications (HCC)    Vitamin D  deficiency     Past Surgical History:  Procedure Laterality Date   BIOPSY  09/30/2023   Procedure: BIOPSY;  Surgeon: Maryruth Ole DASEN, MD;  Location: [facility] ENDOSCOPY;  Service: Gastroenterology;;   COLONOSCOPY     COLONOSCOPY WITH PROPOFOL  N/A 05/28/2018   Procedure: COLONOSCOPY WITH PROPOFOL ;  Surgeon: Viktoria Lamar DASEN, MD;  Location: [facility] ENDOSCOPY;  Service: Endoscopy;   Laterality: N/A;   COLONOSCOPY WITH PROPOFOL  N/A 09/30/2023   Procedure: COLONOSCOPY WITH PROPOFOL ;  Surgeon: Maryruth Ole DASEN, MD;  Location: [facility] ENDOSCOPY;  Service: Gastroenterology;  Laterality: N/A;   DILATION AND CURETTAGE OF UTERUS     ESOPHAGOGASTRODUODENOSCOPY (EGD) WITH PROPOFOL  N/A 05/28/2018   Procedure: ESOPHAGOGASTRODUODENOSCOPY (EGD) WITH PROPOFOL ;  Surgeon: Viktoria Lamar DASEN, MD;  Location: [facility] ENDOSCOPY;  Service: Endoscopy;  Laterality: N/A;   ESOPHAGOGASTRODUODENOSCOPY (EGD) WITH PROPOFOL  N/A 09/30/2023   Procedure: ESOPHAGOGASTRODUODENOSCOPY (EGD) WITH PROPOFOL ;  Surgeon: Maryruth Ole DASEN, MD;  Location: [facility] ENDOSCOPY;  Service: Gastroenterology;  Laterality: N/A;   HYSTEROSCOPY WITH D & C     and polyp removal   POLYPECTOMY  09/30/2023   Procedure: POLYPECTOMY;  Surgeon: Maryruth Ole DASEN, MD;  Location: [facility] ENDOSCOPY;  Service: Gastroenterology;;     Family History  Problem Relation Age of Onset   Hypertension Mother    Diabetes Mother    Diverticulosis Mother    Depression Mother    Hypercholesterolemia Father    Hypertension Father    Cancer Father    Heart disease Maternal Grandfather    Diverticulosis Paternal Grandmother    Hyperlipidemia Paternal Grandmother    Heart failure Paternal Grandmother    Cancer Maternal Grandmother  Alcoholism Paternal Uncle       Social History   Tobacco Use   Smoking status: Former    Current packs/day: 0.00    Types: Cigarettes    Quit date: 08/06/2000    Years since quitting: 23.7   Smokeless tobacco: Never  Vaping Use   Vaping status: Never Used  Substance Use Topics   Alcohol use: Not Currently    Comment: rare   Drug use: No     Allergies  Allergen Reactions   Tetracycline Other (See Comments)    Other reaction(s): Unknown    Augmentin [Amoxicillin-Pot Clavulanate]     GI intolerance    Citalopram     Didn't do well   Hctz [Hydrochlorothiazide]    Lisinopril     Other  reaction(s): Other (See Comments) cough   Other     Mycins   Prednisone     Chest pain   Sertraline     Didn't do well   Ciprofloxacin  Nausea Only and Palpitations   Levofloxacin  Nausea Only and Palpitations    Current Outpatient Medications  Medication Sig Dispense Refill   albuterol  (VENTOLIN  HFA) 108 (90 Base) MCG/ACT inhaler Inhale 2 puffs into the lungs every 6 (six) hours as needed for wheezing. Patient request ProAir  brand only. 18 g 2   amLODipine  (NORVASC ) 10 MG tablet TAKE ONE-HALF TABLET BY MOUTH TWICE DAILY 90 tablet 0   Cholecalciferol (VITAMIN D3) 50 MCG (2000 UT) capsule Take 2,000 Units by mouth daily.     Cranberry (RA CRANBERRY) 500 MG CAPS Take 500 mg by mouth daily.     D-Mannose 500 MG CAPS Take 500 mg by mouth in the morning, at noon, and at bedtime.     famotidine (PEPCID) 20 MG tablet TAKE 2 TABLETS (40 MG) BY MOUTH NIGHTLY     fluticasone  (FLONASE ) 50 MCG/ACT nasal spray Place 1 spray into both nostrils daily. 48 g 1   glucose blood test strip Use as directed to check sugars BID. Dx E11.9 100 each 12   Lancets MISC by Does not apply route. Use 1 lancets three times a day     levonorgestrel (MIRENA) 20 MCG/DAY IUD by Intrauterine route.     loratadine (CLARITIN) 10 MG tablet Take 10 mg by mouth daily.     losartan  (COZAAR ) 100 MG tablet Take 1 tablet by mouth once daily 90 tablet 0   metoprolol  succinate (TOPROL -XL) 100 MG 24 hr tablet TAKE 1/2 (ONE-HALF) TABLET BY MOUTH TWICE DAILY (TAKE  WITH  OR  IMMEDIATELY  FOLLOWING  A  MEAL) 90 tablet 1   Multiple Vitamin (MULTIVITAMIN) tablet Take 1 tablet by mouth daily.     mupirocin  ointment (BACTROBAN ) 2 % Apply 1 Application topically 2 (two) times daily. 22 g 0   pravastatin  (PRAVACHOL ) 40 MG tablet Take 1 tablet by mouth once daily 90 tablet 0   Probiotic Product (FEM-DOPHILUS WOMENS PO) Take 1 tablet by mouth daily.     Semaglutide ,0.25 or 0.5MG /DOS, (OZEMPIC , 0.25 OR 0.5 MG/DOSE,) 2 MG/3ML SOPN Inject 0.5 mg  into the skin once a week. 3 mL 2   solifenacin (VESICARE) 5 MG tablet Take 5 mg by mouth daily.     tamsulosin (FLOMAX) 0.4 MG CAPS capsule Take 0.4 mg by mouth daily.     Vilazodone  HCl (VIIBRYD ) 10 MG TABS Take 5 mg by mouth daily. (Patient taking differently: Take 10 mg by mouth daily.)     No current facility-administered medications for this visit.  REVIEW OF SYSTEMS (Negative unless checked)  Constitutional: [] Weight loss  [] Fever  [] Chills Cardiac: [] Chest pain   [] Chest pressure   [] Palpitations   [] Shortness of breath when laying flat   [] Shortness of breath at rest   [] Shortness of breath with exertion. Vascular:  [] Pain in legs with walking   [] Pain in legs at rest   [] Pain in legs when laying flat   [] Claudication   [] Pain in feet when walking  [] Pain in feet at rest  [] Pain in feet when laying flat   [] History of DVT   [] Phlebitis   [] Swelling in legs   [] Varicose veins   [] Non-healing ulcers Pulmonary:   [] Uses home oxygen   [] Productive cough   [] Hemoptysis   [] Wheeze  [] COPD   [] Asthma Neurologic:  [] Dizziness  [] Blackouts   [] Seizures   [] History of stroke   [] History of TIA  [] Aphasia   [] Temporary blindness   [] Dysphagia   [] Weakness or numbness in arms   [] Weakness or numbness in legs Musculoskeletal:  [x] Arthritis   [] Joint swelling   [] Joint pain   [] Low back pain Hematologic:  [] Easy bruising  [] Easy bleeding   [] Hypercoagulable state   [x] Anemic  [] Hepatitis Gastrointestinal:  [] Blood in stool   [] Vomiting blood  [x] Gastroesophageal reflux/heartburn   [] Abdominal pain Genitourinary:  [] Chronic kidney disease   [] Difficult urination  [x] Frequent urination  [] Burning with urination   [] Hematuria Skin:  [] Rashes   [] Ulcers   [] Wounds Psychological:  [x] History of anxiety   []  History of major depression.    Physical Exam BP (!) 157/93   Pulse 92   Resp 18   Ht 5' 8 (1.727 m)   Wt (!) 311 lb 9.6 oz (141.3 kg)   BMI 47.38 kg/m  Gen:  WD/WN, NAD Head: [place]/AT,  No temporalis wasting. obese Ear/Nose/Throat: Hearing grossly intact, nares w/o erythema or drainage, oropharynx w/o Erythema/Exudate Eyes: Conjunctiva clear, sclera non-icteric  Neck: trachea midline.  No JVD.  Pulmonary:  Good air movement, respirations not labored, no use of accessory muscles  Cardiac: RRR, no JVD Vascular:  Vessel Right Left  Radial Palpable Palpable                                   Gastrointestinal:. No tenderness Musculoskeletal: M/S 5/5 throughout.  Extremities without ischemic changes.  No deformity or atrophy. No edema. Neurologic: Sensation grossly intact in extremities.  Symmetrical.  Speech is fluent. Motor exam as listed above. Psychiatric: Judgment intact, Mood & affect appropriate for pt's clinical situation. Dermatologic: No rashes or ulcers noted.  No cellulitis or open wounds.    Radiology No results found.  Labs Recent Results (from the past 2160 hours)  Measles/Mumps/Rubella Immunity     Status: Abnormal   Collection Time: 02/14/24 12:58 PM  Result Value Ref Range   Rubeola IgG <13.50 (L) AU/mL    Comment: AU/mL            Interpretation -----            -------------- <13.50           Not consistent with immunity 13.50-16.49      Equivocal >16.49           Consistent with immunity . The presence of measles IgG suggests immunization or past or current infection with measles virus. . For additional information, please refer to http://education.[facility].com/faq/FAQ162 (This link is being provided for informational/ educational purposes  only.) .    Mumps IgG 23.70 AU/mL    Comment:  AU/mL           Interpretation -------         ---------------- <9.00             Not consistent with immunity 9.00-10.99        Equivocal >10.99            Consistent with immunity . The presence of mumps IgG antibody suggests immunization or past or current infection with mumps virus. .    Rubella 17.80 Index    Comment:     Index             Interpretation     -----            --------------       <0.90            Not consistent with immunity     0.90-0.99        Equivocal     > or = 1.00      Consistent with immunity  . The presence of rubella IgG antibody suggests  immunization or past or current infection with rubella virus.     Assessment/Plan:  Hypertension blood pressure control important in reducing the progression of atherosclerotic disease. On appropriate oral medications.   Type 2 diabetes mellitus with hyperglycemia (HCC) blood glucose control important in reducing the progression of atherosclerotic disease. Also, involved in wound healing. On appropriate medications.   Splenic artery aneurysm (HCC) Small, 1 cm splenic artery aneurysm on scan from 6 months ago. She has another scan in about 2 months for her renal cancer. I will see her back in 3 months to review.  I had a long discussion with the patient and her husband today about the pathophysiology and natural history of splenic artery aneurysms.  It small at this time and does not need repaired.  It will need to be monitored regularly with either duplex or CT scan.  She is going to be getting CT scans for her renal cancer.  That will be a perfectly adequate way to follow this.  As long as she does not have any pregnancy planned, it does not need repair until it reaches at least 2 cm in diameter.      Selinda Gu 04/14/2024, 12:24 PM   This note was created with Dragon medical transcription system.  Any errors from dictation are unintentional.

## 2024-04-17 ENCOUNTER — Ambulatory Visit: Admitting: Internal Medicine

## 2024-04-17 VITALS — BP 122/70 | HR 89 | Resp 16 | Ht 68.0 in | Wt 312.0 lb

## 2024-04-17 DIAGNOSIS — E1165 Type 2 diabetes mellitus with hyperglycemia: Secondary | ICD-10-CM | POA: Diagnosis not present

## 2024-04-17 DIAGNOSIS — N200 Calculus of kidney: Secondary | ICD-10-CM

## 2024-04-17 DIAGNOSIS — Z23 Encounter for immunization: Secondary | ICD-10-CM

## 2024-04-17 DIAGNOSIS — E7800 Pure hypercholesterolemia, unspecified: Secondary | ICD-10-CM

## 2024-04-17 DIAGNOSIS — I10 Essential (primary) hypertension: Secondary | ICD-10-CM

## 2024-04-17 DIAGNOSIS — D75839 Thrombocytosis, unspecified: Secondary | ICD-10-CM | POA: Diagnosis not present

## 2024-04-17 DIAGNOSIS — D509 Iron deficiency anemia, unspecified: Secondary | ICD-10-CM

## 2024-04-17 DIAGNOSIS — E559 Vitamin D deficiency, unspecified: Secondary | ICD-10-CM

## 2024-04-17 DIAGNOSIS — I728 Aneurysm of other specified arteries: Secondary | ICD-10-CM

## 2024-04-17 DIAGNOSIS — C649 Malignant neoplasm of unspecified kidney, except renal pelvis: Secondary | ICD-10-CM

## 2024-04-17 DIAGNOSIS — F439 Reaction to severe stress, unspecified: Secondary | ICD-10-CM

## 2024-04-17 MED ORDER — NYSTATIN 100000 UNIT/GM EX CREA: 1.0000 | TOPICAL_CREAM | Freq: Two times a day (BID) | CUTANEOUS | 0 refills | Status: AC

## 2024-04-17 NOTE — Progress Notes (Unsigned)
 Subjective:    Patient ID: Lindsey Strickland, female    DOB: 1976-02-25, 48 y.o.   MRN: [id number]  Patient here for  Chief Complaint  Patient presents with  . Medical Management of Chronic Issues    HPI Here for a scheduled follow up -  follow up regarding diabetes, hypertension and hypercholesterolemia.  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 RUS 5/13 - small kidney stone in the right kidney (4mm). Recommended f/u CT in 5 months. Recommended to discontinue tamsulosin and solifenacin. Recommended f/u CT and CXR 05/2024 - RCC surveillance. Recently started on ozempic . Metformin  - contributing to diarrhea. Appears to be tolerating ozempic  relatively well. Saw Dr Rennie - 12/24/23 - anemia. Hgb 12.2. improved. Hold iron  infusion. Saw gyn - 01/07/24 - pap performed. Discussed hysterectomy - endometrial hyperplasia. Per note, referred to urogyn - stress incontinence. Saw Dr Maree - 01/22/24 - visual disturbance, migraine - recommended mag glycinate. Pulmonary 01/22/24 - continue cpap. Weight loss. Saw GI 02/06/24 - diarrhea felt to be related to metformin . Off now as outlined.     Past Medical History:  Diagnosis Date  . Abnormal bleeding in menstrual cycle   . Anemia   . Atypical endometrial hyperplasia   . Cancer (HCC)   . Clear cell carcinoma of kidney (HCC)   . Diabetes mellitus (HCC)   . Diarrhea   . Endometrial hyperplasia without atypia, complex   . Environmental allergies   . GAD (generalized anxiety disorder)   . Generalized anxiety disorder   . GERD (gastroesophageal reflux disease)   . Hypercholesterolemia   . Hypertension   . Insomnia   . Iron  deficiency anemia   . Leukocytosis   . Major depressive disorder   . Numbness and tingling   . Palpitations   . Renal mass   . Symptomatic anemia   . Thrombocytosis   . Type 2 diabetes mellitus without complications (HCC)   . Vitamin D  deficiency    Past Surgical History:  Procedure  Laterality Date  . BIOPSY  09/30/2023   Procedure: BIOPSY;  Surgeon: Maryruth Ole DASEN, MD;  Location: [facility] ENDOSCOPY;  Service: Gastroenterology;;  . COLONOSCOPY    . COLONOSCOPY WITH PROPOFOL  N/A 05/28/2018   Procedure: COLONOSCOPY WITH PROPOFOL ;  Surgeon: Viktoria Lamar DASEN, MD;  Location: [facility] ENDOSCOPY;  Service: Endoscopy;  Laterality: N/A;  . COLONOSCOPY WITH PROPOFOL  N/A 09/30/2023   Procedure: COLONOSCOPY WITH PROPOFOL ;  Surgeon: Maryruth Ole DASEN, MD;  Location: [facility] ENDOSCOPY;  Service: Gastroenterology;  Laterality: N/A;  . DILATION AND CURETTAGE OF UTERUS    . ESOPHAGOGASTRODUODENOSCOPY (EGD) WITH PROPOFOL  N/A 05/28/2018   Procedure: ESOPHAGOGASTRODUODENOSCOPY (EGD) WITH PROPOFOL ;  Surgeon: Viktoria Lamar DASEN, MD;  Location: [facility] ENDOSCOPY;  Service: Endoscopy;  Laterality: N/A;  . ESOPHAGOGASTRODUODENOSCOPY (EGD) WITH PROPOFOL  N/A 09/30/2023   Procedure: ESOPHAGOGASTRODUODENOSCOPY (EGD) WITH PROPOFOL ;  Surgeon: Maryruth Ole DASEN, MD;  Location: [facility] ENDOSCOPY;  Service: Gastroenterology;  Laterality: N/A;  . HYSTEROSCOPY WITH D & C     and polyp removal  . POLYPECTOMY  09/30/2023   Procedure: POLYPECTOMY;  Surgeon: Maryruth Ole DASEN, MD;  Location: [facility] ENDOSCOPY;  Service: Gastroenterology;;   Family History  Problem Relation Age of Onset  . Hypertension Mother   . Diabetes Mother   . Diverticulosis Mother   . Depression Mother   . Hypercholesterolemia Father   . Hypertension Father   . Cancer Father   . Heart disease Maternal Grandfather   . Diverticulosis  Paternal Grandmother   . Hyperlipidemia Paternal Grandmother   . Heart failure Paternal Grandmother   . Cancer Maternal Grandmother   . Alcoholism Paternal Uncle    Social History   Socioeconomic History  . Marital status: Single    Spouse name: Not on file  . Number of children: 2  . Years of education: Not on file  . Highest education level: Associate degree: academic program  Occupational History     Employer: [employer]  Tobacco Use  . Smoking status: Former    Current packs/day: 0.00    Types: Cigarettes    Quit date: 08/06/2000    Years since quitting: 23.7  . Smokeless tobacco: Never  Vaping Use  . Vaping status: Never Used  Substance and Sexual Activity  . Alcohol use: Not Currently    Comment: rare  . Drug use: No  . Sexual activity: Yes    Birth control/protection: I.U.D.  Other Topics Concern  . Not on file  Social History Narrative  . Not on file   Social Drivers of Health   Financial Resource Strain: Low Risk  (02/14/2024)   Overall Financial Resource Strain (CARDIA)   . Difficulty of Paying Living Expenses: Not hard at all  Food Insecurity: No Food Insecurity (02/14/2024)   Hunger Vital Sign   . Worried About Programme researcher, broadcasting/film/video in the Last Year: Never true   . Ran Out of Food in the Last Year: Never true  Transportation Needs: No Transportation Needs (02/14/2024)   PRAPARE - Transportation   . Lack of Transportation (Medical): No   . Lack of Transportation (Non-Medical): No  Physical Activity: Insufficiently Active (02/14/2024)   Exercise Vital Sign   . Days of Exercise per Week: 1 day   . Minutes of Exercise per Session: 20 min  Stress: Stress Concern Present (02/14/2024)   [employer] of Occupational Health - Occupational Stress Questionnaire   . Feeling of Stress: Very much  Social Connections: Unknown (02/14/2024)   Social Connection and Isolation Panel   . Frequency of Communication with Friends and Family: Not on file   . Frequency of Social Gatherings with Friends and Family: Not on file   . Attends Religious Services: Not on file   . Active Member of Clubs or Organizations: No   . Attends Banker Meetings: Not on file   . Marital Status: Not on file     Review of Systems     Objective:     BP 122/70   Pulse 89   Resp 16   Ht 5' 8 (1.727 m)   Wt (!) 312 lb (141.5 kg)   SpO2 98%   BMI 47.44 kg/m  Wt Readings from  Last 3 Encounters:  04/17/24 (!) 312 lb (141.5 kg)  04/14/24 (!) 311 lb 9.6 oz (141.3 kg)  02/14/24 (!) 307 lb 9.6 oz (139.5 kg)    Physical Exam  {Perform Simple Foot Exam  Perform Detailed exam:1} {Insert foot Exam (Optional):30965}   Outpatient Encounter Medications as of 04/17/2024  Medication Sig  . albuterol  (VENTOLIN  HFA) 108 (90 Base) MCG/ACT inhaler Inhale 2 puffs into the lungs every 6 (six) hours as needed for wheezing. Patient request ProAir  brand only.  . amLODipine  (NORVASC ) 10 MG tablet TAKE ONE-HALF TABLET BY MOUTH TWICE DAILY  . Cholecalciferol (VITAMIN D3) 50 MCG (2000 UT) capsule Take 2,000 Units by mouth daily.  . Cranberry (RA CRANBERRY) 500 MG CAPS Take 500 mg by mouth daily.  SABRA  D-Mannose 500 MG CAPS Take 500 mg by mouth in the morning, at noon, and at bedtime.  . famotidine (PEPCID) 20 MG tablet TAKE 2 TABLETS (40 MG) BY MOUTH NIGHTLY  . fluticasone  (FLONASE ) 50 MCG/ACT nasal spray Place 1 spray into both nostrils daily.  SABRA glucose blood test strip Use as directed to check sugars BID. Dx E11.9  . Lancets MISC by Does not apply route. Use 1 lancets three times a day  . levonorgestrel (MIRENA) 20 MCG/DAY IUD by Intrauterine route.  . loratadine (CLARITIN) 10 MG tablet Take 10 mg by mouth daily.  . losartan  (COZAAR ) 100 MG tablet Take 1 tablet by mouth once daily  . metoprolol  succinate (TOPROL -XL) 100 MG 24 hr tablet TAKE 1/2 (ONE-HALF) TABLET BY MOUTH TWICE DAILY (TAKE  WITH  OR  IMMEDIATELY  FOLLOWING  A  MEAL)  . Multiple Vitamin (MULTIVITAMIN) tablet Take 1 tablet by mouth daily.  . mupirocin  ointment (BACTROBAN ) 2 % Apply 1 Application topically 2 (two) times daily.  . pravastatin  (PRAVACHOL ) 40 MG tablet Take 1 tablet by mouth once daily  . Probiotic Product (FEM-DOPHILUS WOMENS PO) Take 1 tablet by mouth daily.  . Semaglutide ,0.25 or 0.5MG /DOS, (OZEMPIC , 0.25 OR 0.5 MG/DOSE,) 2 MG/3ML SOPN Inject 0.5 mg into the skin once a week.  . solifenacin (VESICARE) 5  MG tablet Take 5 mg by mouth daily.  . tamsulosin (FLOMAX) 0.4 MG CAPS capsule Take 0.4 mg by mouth daily.  . Vilazodone  HCl (VIIBRYD ) 10 MG TABS Take 5 mg by mouth daily. (Patient taking differently: Take 10 mg by mouth daily.)   No facility-administered encounter medications on file as of 04/17/2024.     Lab Results  Component Value Date   WBC 11.6 (H) 12/24/2023   HGB 12.2 12/24/2023   HCT 37.9 12/24/2023   PLT 407 (H) 12/24/2023   GLUCOSE 117 (H) 12/24/2023   CHOL 179 12/20/2023   TRIG 310.0 (H) 12/20/2023   HDL 41.50 12/20/2023   LDLDIRECT 132.0 11/02/2020   LDLCALC 76 12/20/2023   ALT 19 12/20/2023   AST 14 12/20/2023   NA 134 (L) 12/24/2023   K 3.9 12/24/2023   CL 101 12/24/2023   CREATININE 0.74 12/24/2023   BUN 12 12/24/2023   CO2 22 12/24/2023   TSH 3.55 07/25/2023   INR 0.93 05/05/2018   HGBA1C 7.5 (H) 12/20/2023    No results found.     Assessment & Plan:  Type 2 diabetes mellitus with hyperglycemia, without long-term current use of insulin (HCC)  Thrombocytosis (HCC)  Renal cell carcinoma, unspecified laterality (HCC)  Hypercholesterolemia  Primary hypertension     Allena Hamilton, MD

## 2024-04-19 ENCOUNTER — Other Ambulatory Visit: Payer: Self-pay | Admitting: Internal Medicine

## 2024-04-19 ENCOUNTER — Encounter: Payer: Self-pay | Admitting: Internal Medicine

## 2024-04-19 NOTE — Assessment & Plan Note (Signed)
 Continue pravastatin .  Diet and exercise.  Had previously discussed changing to crestor.  Had previously wanted to hold on change. Follow lipid panel. Continue diet and exercise.  Lab Results  Component Value Date   CHOL 179 12/20/2023   HDL 41.50 12/20/2023   LDLCALC 76 12/20/2023   LDLDIRECT 132.0 11/02/2020   TRIG 310.0 (H) 12/20/2023   CHOLHDL 4 12/20/2023

## 2024-04-19 NOTE — Assessment & Plan Note (Signed)
Follow vitamin D level.  

## 2024-04-19 NOTE — Assessment & Plan Note (Signed)
 Currently on ozempic  .5mg . No checking sugars. Low carb diet and exercise. Follow met b and A1c. Discussed possible side effects.

## 2024-04-19 NOTE — Assessment & Plan Note (Signed)
 Seeing psychiatry. Continues on vibryd. Follow. Has good support.

## 2024-04-19 NOTE — Assessment & Plan Note (Signed)
 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 RUS 5/13 - small kidney stone in the right kidney (4mm). Recommended f/u CT in 5 months. Recommended to discontinue tamsulosin and solifenacin. Recommended f/u CT and CXR 05/2024 - RCC surveillance.

## 2024-04-19 NOTE — Assessment & Plan Note (Signed)
 Sees Dr Rennie.

## 2024-04-19 NOTE — Assessment & Plan Note (Signed)
 Has been evaluated by hematology.  Felt to be reactive.  Receiving iron  infusions. Continue f/u with hematology. Follow cbc.

## 2024-04-19 NOTE — Assessment & Plan Note (Signed)
 Continue losartan , metoprolol  and amlodipine .  Follow pressures. No changes in medication today. Blood pressure as outlined. Follow metabolic panel.

## 2024-04-19 NOTE — Assessment & Plan Note (Signed)
 Saw Dr Marea. No further intervention at this time. Currently getting regular surveillance CTs - will follow with these CT.

## 2024-04-19 NOTE — Assessment & Plan Note (Signed)
 Tolerating ozempic . Low carb diet and exercise.

## 2024-04-19 NOTE — Assessment & Plan Note (Signed)
 Is s/p left percutaneous nephrostolithotomy/pyelostolithotomy w/wo dilat, endoscop, litho, stent (10/2023). Did well with surgery. Post op CT - resolution of large staghorn calculus and remaining small non obstructing left renal stones. Discharged on bactrim. 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 Recommended f/u CTA of abdomen in one year. 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 RUS 5/13 - small kidney stone in the right kidney (4mm). Recommended f/u CT in 5 months. Recommended to discontinue tamsulosin and solifenacin. Recommended f/u CT and CXR 05/2024 - RCC surveillance.

## 2024-04-29 ENCOUNTER — Inpatient Hospital Stay (HOSPITAL_BASED_OUTPATIENT_CLINIC_OR_DEPARTMENT_OTHER): Admitting: Internal Medicine

## 2024-04-29 ENCOUNTER — Inpatient Hospital Stay

## 2024-04-29 ENCOUNTER — Encounter: Payer: Self-pay | Admitting: Internal Medicine

## 2024-04-29 ENCOUNTER — Inpatient Hospital Stay: Attending: Internal Medicine

## 2024-04-29 VITALS — BP 167/89 | HR 96 | Temp 100.4°F | Resp 16 | Ht 68.0 in | Wt 312.7 lb

## 2024-04-29 DIAGNOSIS — E611 Iron deficiency: Secondary | ICD-10-CM | POA: Insufficient documentation

## 2024-04-29 DIAGNOSIS — E1165 Type 2 diabetes mellitus with hyperglycemia: Secondary | ICD-10-CM

## 2024-04-29 DIAGNOSIS — C649 Malignant neoplasm of unspecified kidney, except renal pelvis: Secondary | ICD-10-CM | POA: Insufficient documentation

## 2024-04-29 DIAGNOSIS — D649 Anemia, unspecified: Secondary | ICD-10-CM | POA: Diagnosis present

## 2024-04-29 LAB — CBC WITH DIFFERENTIAL (CANCER CENTER ONLY)
Abs Immature Granulocytes: 0.11 K/uL — ABNORMAL HIGH (ref 0.00–0.07)
Basophils Absolute: 0 K/uL (ref 0.0–0.1)
Basophils Relative: 0 %
Eosinophils Absolute: 0.2 K/uL (ref 0.0–0.5)
Eosinophils Relative: 2 %
HCT: 38.4 % (ref 36.0–46.0)
Hemoglobin: 12.7 g/dL (ref 12.0–15.0)
Immature Granulocytes: 1 %
Lymphocytes Relative: 26 %
Lymphs Abs: 3.3 K/uL (ref 0.7–4.0)
MCH: 28.9 pg (ref 26.0–34.0)
MCHC: 33.1 g/dL (ref 30.0–36.0)
MCV: 87.5 fL (ref 80.0–100.0)
Monocytes Absolute: 0.8 K/uL (ref 0.1–1.0)
Monocytes Relative: 6 %
Neutro Abs: 8.1 K/uL — ABNORMAL HIGH (ref 1.7–7.7)
Neutrophils Relative %: 65 %
Platelet Count: 364 K/uL (ref 150–400)
RBC: 4.39 MIL/uL (ref 3.87–5.11)
RDW: 13.9 % (ref 11.5–15.5)
WBC Count: 12.5 K/uL — ABNORMAL HIGH (ref 4.0–10.5)
nRBC: 0 % (ref 0.0–0.2)

## 2024-04-29 LAB — BASIC METABOLIC PANEL WITH GFR
Anion gap: 10 (ref 5–15)
BUN: 15 mg/dL (ref 6–20)
CO2: 24 mmol/L (ref 22–32)
Calcium: 9.9 mg/dL (ref 8.9–10.3)
Chloride: 100 mmol/L (ref 98–111)
Creatinine, Ser: 0.73 mg/dL (ref 0.44–1.00)
GFR, Estimated: >60 mL/min (ref >60)
Glucose, Bld: 149 mg/dL — ABNORMAL HIGH (ref 70–99)
Potassium: 4.5 mmol/L (ref 3.5–5.1)
Sodium: 134 mmol/L — ABNORMAL LOW (ref 135–145)

## 2024-04-29 LAB — FERRITIN: Ferritin: 59 ng/mL (ref 11–307)

## 2024-04-29 LAB — IRON AND TIBC
Iron: 66 ug/dL (ref 28–170)
Saturation Ratios: 15 % (ref 10.4–31.8)
TIBC: 434 ug/dL (ref 250–450)
UIBC: 368 ug/dL

## 2024-04-29 NOTE — Assessment & Plan Note (Addendum)
#   Anemia- Hb- 10.5; Ferritin- 8.5 [PCP- APRIL 2024] symptomatic.  Likely due to iron  deficiency -menorrhagia.     # Today hemoglobin 12.2-  HOLD infusion Venofer - . Not on iron  sec to chronic mild diarrhea-see below. consider iron  next visit  # Etiology of iron  deficiency: s/p IUD-Heavy  Menstrual cycles- Amy Armida; GYN [[facility]]- colo 2019 [[facility]] -  awaiting evaluation with Gyn- [facility] re: Hysterctomy.   # Hx of right RCC [incidental- Dr.raynor- [place] ] s/p partial nephrectomy- CT- [ Oct 2024- [facility]]; s/p  staghorn stone extraction.    # vit D def- [Dr.Shah/PCP]-ok with vit D as per PCP   HCG Urine- IUD-declined  # DISPOSITION: # HOLD venofer  today # follow up 6  month MD; labs- cbc/bmp; iron  studies; ferritin;  possible venofer  -Dr.B

## 2024-04-29 NOTE — Progress Notes (Signed)
 [facility] CONSULT NOTE  Patient Care Team: Glendia Shad, MD as PCP - General (Internal Medicine) Rennie Cindy SAUNDERS, MD as Consulting Physician (Oncology)  CHIEF COMPLAINTS/PURPOSE OF CONSULTATION: ANEMIA   HEMATOLOGY HISTORY  # ANEMIA[Hb; MCV-platelets- WBC; Iron  sat; ferritin;  GFR- CT/US - ;  EGD/colonoscopy-   Latest Reference Range & Units 11/29/22 13:57  Iron  42 - 145 ug/dL 38 (L)  TIBC 749.9 - 549.9 mcg/dL 549.1 (H)  Saturation Ratios 20.0 - 50.0 % 8.4 (L)  Ferritin 10.0 - 291.0 ng/mL 8.5 (L)  Transferrin 212.0 - 360.0 mg/dL 677.9  (L): Data is abnormally low (H): Data is abnormally high  HISTORY OF PRESENTING ILLNESS: Patient ambulating-independently.  Accompanied by family.    Lindsey Strickland 48 y.o.  female pleasant patient staging kidney cancer and also history of heavy menstrual cycles/iron  deficient anemia is here for follow-up.  Continues to have intermittent spotting and bleeding. She feels improved and also energy.  She is not on oral iron  because of GI upset/chronic diarrhea.  Currently on Ozempic .   Review of Systems  Constitutional:  Negative for chills, diaphoresis, fever and weight loss.  HENT:  Negative for nosebleeds and sore throat.   Eyes:  Negative for double vision.  Respiratory:  Negative for cough, hemoptysis, sputum production and wheezing.   Cardiovascular:  Negative for chest pain, palpitations, orthopnea and leg swelling.  Gastrointestinal:  Negative for abdominal pain, blood in stool, constipation, diarrhea, heartburn, melena, nausea and vomiting.  Genitourinary:  Negative for dysuria, frequency and urgency.  Musculoskeletal:  Negative for back pain and joint pain.  Skin: Negative.  Negative for itching and rash.  Neurological:  Negative for tingling, focal weakness, weakness and headaches.  Endo/Heme/Allergies:  Does not bruise/bleed easily.  Psychiatric/Behavioral:  Negative for depression. The patient is not  nervous/anxious and does not have insomnia.      MEDICAL HISTORY:  Past Medical History:  Diagnosis Date   Abnormal bleeding in menstrual cycle    Anemia    Atypical endometrial hyperplasia    Cancer (HCC)    Clear cell carcinoma of kidney (HCC)    Diabetes mellitus (HCC)    Diarrhea    Endometrial hyperplasia without atypia, complex    Environmental allergies    GAD (generalized anxiety disorder)    Generalized anxiety disorder    GERD (gastroesophageal reflux disease)    Hypercholesterolemia    Hypertension    Insomnia    Iron  deficiency anemia    Leukocytosis    Major depressive disorder    Numbness and tingling    Palpitations    Renal mass    Symptomatic anemia    Thrombocytosis    Type 2 diabetes mellitus without complications (HCC)    Vitamin D  deficiency     SURGICAL HISTORY: Past Surgical History:  Procedure Laterality Date   BIOPSY  09/30/2023   Procedure: BIOPSY;  Surgeon: Maryruth Ole DASEN, MD;  Location: [facility] ENDOSCOPY;  Service: Gastroenterology;;   COLONOSCOPY     COLONOSCOPY WITH PROPOFOL  N/A 05/28/2018   Procedure: COLONOSCOPY WITH PROPOFOL ;  Surgeon: Viktoria Lamar DASEN, MD;  Location: [facility] ENDOSCOPY;  Service: Endoscopy;  Laterality: N/A;   COLONOSCOPY WITH PROPOFOL  N/A 09/30/2023   Procedure: COLONOSCOPY WITH PROPOFOL ;  Surgeon: Maryruth Ole DASEN, MD;  Location: [facility] ENDOSCOPY;  Service: Gastroenterology;  Laterality: N/A;   DILATION AND CURETTAGE OF UTERUS     ESOPHAGOGASTRODUODENOSCOPY (EGD) WITH PROPOFOL  N/A 05/28/2018   Procedure: ESOPHAGOGASTRODUODENOSCOPY (EGD) WITH PROPOFOL ;  Surgeon: Viktoria Lamar DASEN,  MD;  Location: [facility] ENDOSCOPY;  Service: Endoscopy;  Laterality: N/A;   ESOPHAGOGASTRODUODENOSCOPY (EGD) WITH PROPOFOL  N/A 09/30/2023   Procedure: ESOPHAGOGASTRODUODENOSCOPY (EGD) WITH PROPOFOL ;  Surgeon: Maryruth Ole DASEN, MD;  Location: [facility] ENDOSCOPY;  Service: Gastroenterology;  Laterality: N/A;   HYSTEROSCOPY WITH D & C     and polyp  removal   POLYPECTOMY  09/30/2023   Procedure: POLYPECTOMY;  Surgeon: Maryruth Ole DASEN, MD;  Location: [facility] ENDOSCOPY;  Service: Gastroenterology;;    SOCIAL HISTORY: Social History   Socioeconomic History   Marital status: Single    Spouse name: Not on file   Number of children: 2   Years of education: Not on file   Highest education level: Associate degree: academic program  Occupational History    Employer: [employer]  Tobacco Use   Smoking status: Former    Current packs/day: 0.00    Types: Cigarettes    Quit date: 08/06/2000    Years since quitting: 23.7   Smokeless tobacco: Never  Vaping Use   Vaping status: Never Used  Substance and Sexual Activity   Alcohol use: Not Currently    Comment: rare   Drug use: No   Sexual activity: Yes    Birth control/protection: I.U.D.  Other Topics Concern   Not on file  Social History Narrative   Not on file   Social Drivers of Health   Financial Resource Strain: Low Risk  (02/14/2024)   Overall Financial Resource Strain (CARDIA)    Difficulty of Paying Living Expenses: Not hard at all  Food Insecurity: No Food Insecurity (02/14/2024)   Hunger Vital Sign    Worried About Running Out of Food in the Last Year: Never true    Ran Out of Food in the Last Year: Never true  Transportation Needs: No Transportation Needs (02/14/2024)   PRAPARE - Administrator, Civil Service (Medical): No    Lack of Transportation (Non-Medical): No  Physical Activity: Insufficiently Active (02/14/2024)   Exercise Vital Sign    Days of Exercise per Week: 1 day    Minutes of Exercise per Session: 20 min  Stress: Stress Concern Present (02/14/2024)   [employer] of Occupational Health - Occupational Stress Questionnaire    Feeling of Stress: Very much  Social Connections: Unknown (02/14/2024)   Social Connection and Isolation Panel    Frequency of Communication with Friends and Family: Not on file    Frequency of Social Gatherings  with Friends and Family: Not on file    Attends Religious Services: Not on file    Active Member of Clubs or Organizations: No    Attends Banker Meetings: Not on file    Marital Status: Not on file  Intimate Partner Violence: Not At Risk (01/21/2023)   Humiliation, Afraid, Rape, and Kick questionnaire    Fear of Current or Ex-Partner: No    Emotionally Abused: No    Physically Abused: No    Sexually Abused: No    FAMILY HISTORY: Family History  Problem Relation Age of Onset   Hypertension Mother    Diabetes Mother    Diverticulosis Mother    Depression Mother    Hypercholesterolemia Father    Hypertension Father    Cancer Father    Heart disease Maternal Grandfather    Diverticulosis Paternal Grandmother    Hyperlipidemia Paternal Grandmother    Heart failure Paternal Grandmother    Cancer Maternal Grandmother    Alcoholism Paternal Uncle  ALLERGIES:  is allergic to tetracycline, augmentin [amoxicillin-pot clavulanate], citalopram, hctz [hydrochlorothiazide], lisinopril, other, prednisone, sertraline, ciprofloxacin , and levofloxacin .  MEDICATIONS:  Current Outpatient Medications  Medication Sig Dispense Refill   albuterol  (VENTOLIN  HFA) 108 (90 Base) MCG/ACT inhaler Inhale 2 puffs into the lungs every 6 (six) hours as needed for wheezing. Patient request ProAir  brand only. 18 g 2   amLODipine  (NORVASC ) 10 MG tablet TAKE ONE-HALF TABLET BY MOUTH TWICE DAILY 90 tablet 0   Cholecalciferol (VITAMIN D3) 50 MCG (2000 UT) capsule Take 2,000 Units by mouth daily.     Cranberry (RA CRANBERRY) 500 MG CAPS Take 500 mg by mouth daily.     D-Mannose 500 MG CAPS Take 500 mg by mouth in the morning, at noon, and at bedtime.     famotidine (PEPCID) 20 MG tablet TAKE 2 TABLETS (40 MG) BY MOUTH NIGHTLY     fluticasone  (FLONASE ) 50 MCG/ACT nasal spray Place 1 spray into both nostrils daily. 48 g 1   glucose blood test strip Use as directed to check sugars BID. Dx E11.9 100  each 12   Lancets MISC by Does not apply route. Use 1 lancets three times a day     levonorgestrel (MIRENA) 20 MCG/DAY IUD by Intrauterine route.     loratadine (CLARITIN) 10 MG tablet Take 10 mg by mouth daily.     losartan  (COZAAR ) 100 MG tablet Take 1 tablet by mouth once daily 90 tablet 0   metoprolol  succinate (TOPROL -XL) 100 MG 24 hr tablet TAKE 1/2 (ONE-HALF) TABLET BY MOUTH TWICE DAILY (TAKE  WITH  OR  IMMEDIATELY  FOLLOWING  A  MEAL) 90 tablet 1   Multiple Vitamin (MULTIVITAMIN) tablet Take 1 tablet by mouth daily.     nystatin  cream (MYCOSTATIN ) Apply 1 Application topically 2 (two) times daily. 30 g 0   pravastatin  (PRAVACHOL ) 40 MG tablet Take 1 tablet by mouth once daily 90 tablet 0   Probiotic Product (FEM-DOPHILUS WOMENS PO) Take 1 tablet by mouth daily.     Semaglutide ,0.25 or 0.5MG /DOS, (OZEMPIC , 0.25 OR 0.5 MG/DOSE,) 2 MG/3ML SOPN Inject 0.5 mg into the skin once a week. 3 mL 2   solifenacin (VESICARE) 5 MG tablet Take 5 mg by mouth daily.     tamsulosin (FLOMAX) 0.4 MG CAPS capsule Take 0.4 mg by mouth daily.     Vilazodone  HCl (VIIBRYD ) 10 MG TABS Take 5 mg by mouth daily. (Patient taking differently: Take 10 mg by mouth daily.)     No current facility-administered medications for this visit.     PHYSICAL EXAMINATION:   Vitals:   04/29/24 1431 04/29/24 1516  BP: (!) 177/93 (!) 167/89  Pulse: 94 96  Resp: 16   Temp: (!) 100.4 F (38 C)   SpO2: 98%      Filed Weights   04/29/24 1431  Weight: (!) 312 lb 11.2 oz (141.8 kg)      Physical Exam Vitals and nursing note reviewed.  HENT:     Head: Normocephalic and atraumatic.     Mouth/Throat:     Pharynx: Oropharynx is clear.  Eyes:     Extraocular Movements: Extraocular movements intact.     Pupils: Pupils are equal, round, and reactive to light.  Cardiovascular:     Rate and Rhythm: Normal rate and regular rhythm.  Pulmonary:     Comments: Decreased breath sounds bilaterally.  Abdominal:      Palpations: Abdomen is soft.  Musculoskeletal:  General: Normal range of motion.     Cervical back: Normal range of motion.  Skin:    General: Skin is warm.  Neurological:     General: No focal deficit present.     Mental Status: She is alert and oriented to person, place, and time.  Psychiatric:        Behavior: Behavior normal.        Judgment: Judgment normal.      LABORATORY DATA:  I have reviewed the data as listed Lab Results  Component Value Date   WBC 12.5 (H) 04/29/2024   HGB 12.7 04/29/2024   HCT 38.4 04/29/2024   MCV 87.5 04/29/2024   PLT 364 04/29/2024   Recent Labs    07/25/23 1029 07/26/23 1203 09/17/23 1451 12/20/23 1223 12/24/23 1505 04/29/24 1433  NA  --    < > 135 136 134* 134*  K  --    < > 4.1 4.0 3.9 4.5  CL  --    < > 101 100 101 100  CO2  --    < > 23 26 22 24   GLUCOSE  --    < > 117* 119* 117* 149*  BUN  --    < > 17 12 12 15   CREATININE  --    < > 0.56 0.58 0.74 0.73  CALCIUM  --    < > 9.1 9.2 8.8* 9.9  GFRNONAA  --   --  >60  --  >60 >60  PROT 7.0  --   --  7.3  --   --   ALBUMIN 4.2  --   --  4.3  --   --   AST 11  --   --  14  --   --   ALT 15  --   --  19  --   --   ALKPHOS 68  --   --  63  --   --   BILITOT 0.3  --   --  0.3  --   --   BILIDIR 0.0  --   --  0.1  --   --    < > = values in this interval not displayed.     No results found.  ASSESSMENT & PLAN:   Symptomatic anemia # Anemia- Hb- 10.5; Ferritin- 8.5 [PCP- APRIL 2024] symptomatic.  Likely due to iron  deficiency -menorrhagia.     # Today hemoglobin 12.2-  HOLD infusion Venofer - . Not on iron  sec to chronic mild diarrhea-see below. consider iron  next visit  # Etiology of iron  deficiency: s/p IUD-Heavy  Menstrual cycles- Amy Armida; GYN [[facility]]- colo 2019 [[facility]] -  awaiting evaluation with Gyn- [facility] re: Hysterctomy.   # Hx of right RCC [incidental- Dr.raynor- [place] ] s/p partial nephrectomy- CT- [ Oct 2024- [facility]]; s/p  staghorn stone extraction.    # vit D def-  [Dr.Shah/PCP]-ok with vit D as per PCP   HCG Urine- IUD-declined  # DISPOSITION: # HOLD venofer  today # follow up 6  month MD; labs- cbc/bmp; iron  studies; ferritin;  possible venofer  -Dr.B    All questions were answered. The patient knows to call the clinic with any problems, questions or concerns.    Cindy JONELLE Joe, MD 04/29/2024 3:29 PM

## 2024-04-29 NOTE — Progress Notes (Signed)
 Fatigue/weakness: NO Dyspena: NO  Light headedness: NO  Blood in stool: NO

## 2024-05-01 ENCOUNTER — Encounter: Payer: Self-pay | Admitting: Internal Medicine

## 2024-05-04 NOTE — Telephone Encounter (Signed)
 Noted

## 2024-05-26 ENCOUNTER — Encounter: Payer: Self-pay | Admitting: Internal Medicine

## 2024-05-26 MED ORDER — ALBUTEROL SULFATE HFA 108 (90 BASE) MCG/ACT IN AERS: 2.0000 | INHALATION_SPRAY | Freq: Four times a day (QID) | RESPIRATORY_TRACT | 1 refills | Status: AC | PRN

## 2024-05-26 NOTE — Telephone Encounter (Signed)
 I have refilled the albuterol  inhaler. Please confirm she is doing ok. Also, please confirm what scan she is scheduled to have, so that I can be watching for results. Will need to stay hydrated. No other change or recommendations.

## 2024-05-27 ENCOUNTER — Other Ambulatory Visit: Payer: Self-pay | Admitting: Internal Medicine

## 2024-05-27 NOTE — Telephone Encounter (Signed)
 Rx already sent in for albuterol  inhaler.

## 2024-05-31 ENCOUNTER — Other Ambulatory Visit: Payer: Self-pay | Admitting: Internal Medicine

## 2024-06-07 ENCOUNTER — Other Ambulatory Visit: Payer: Self-pay | Admitting: Internal Medicine

## 2024-06-19 ENCOUNTER — Encounter: Payer: Self-pay | Admitting: Internal Medicine

## 2024-06-19 ENCOUNTER — Ambulatory Visit: Admitting: Internal Medicine

## 2024-06-19 VITALS — BP 120/80 | HR 95 | Temp 98.4°F | Ht 68.0 in | Wt 312.0 lb

## 2024-06-19 DIAGNOSIS — E7800 Pure hypercholesterolemia, unspecified: Secondary | ICD-10-CM | POA: Diagnosis not present

## 2024-06-19 DIAGNOSIS — E559 Vitamin D deficiency, unspecified: Secondary | ICD-10-CM | POA: Diagnosis not present

## 2024-06-19 DIAGNOSIS — D509 Iron deficiency anemia, unspecified: Secondary | ICD-10-CM

## 2024-06-19 DIAGNOSIS — F439 Reaction to severe stress, unspecified: Secondary | ICD-10-CM

## 2024-06-19 DIAGNOSIS — K219 Gastro-esophageal reflux disease without esophagitis: Secondary | ICD-10-CM

## 2024-06-19 DIAGNOSIS — I728 Aneurysm of other specified arteries: Secondary | ICD-10-CM

## 2024-06-19 DIAGNOSIS — K760 Fatty (change of) liver, not elsewhere classified: Secondary | ICD-10-CM

## 2024-06-19 DIAGNOSIS — E1165 Type 2 diabetes mellitus with hyperglycemia: Secondary | ICD-10-CM

## 2024-06-19 DIAGNOSIS — D75839 Thrombocytosis, unspecified: Secondary | ICD-10-CM

## 2024-06-19 DIAGNOSIS — D649 Anemia, unspecified: Secondary | ICD-10-CM

## 2024-06-19 DIAGNOSIS — I10 Essential (primary) hypertension: Secondary | ICD-10-CM

## 2024-06-19 DIAGNOSIS — C649 Malignant neoplasm of unspecified kidney, except renal pelvis: Secondary | ICD-10-CM

## 2024-06-19 DIAGNOSIS — N200 Calculus of kidney: Secondary | ICD-10-CM

## 2024-06-19 MED ORDER — LOSARTAN POTASSIUM 100 MG PO TABS: 100.0000 mg | ORAL_TABLET | Freq: Every day | ORAL | 1 refills | Status: DC

## 2024-06-19 MED ORDER — PRAVASTATIN SODIUM 40 MG PO TABS: 40.0000 mg | ORAL_TABLET | Freq: Every day | ORAL | 1 refills | Status: AC

## 2024-06-19 MED ORDER — OZEMPIC (0.25 OR 0.5 MG/DOSE) 2 MG/3ML ~~LOC~~ SOPN: 0.5000 mg | PEN_INJECTOR | SUBCUTANEOUS | 2 refills | Status: DC

## 2024-06-19 MED ORDER — AMLODIPINE BESYLATE 10 MG PO TABS: ORAL_TABLET | ORAL | 1 refills | Status: AC

## 2024-06-19 MED ORDER — METOPROLOL SUCCINATE ER 100 MG PO TB24: ORAL_TABLET | ORAL | 1 refills | Status: AC

## 2024-06-19 NOTE — Telephone Encounter (Signed)
 Can eat a small breakfast. Not eat at least a few hours before labs drawn.

## 2024-06-19 NOTE — Progress Notes (Signed)
 Subjective:    Patient ID: Lindsey Strickland, female    DOB: Aug 08, 1975, 48 y.o.   MRN: [id number]  Patient here for  Chief Complaint  Patient presents with   Medical Management of Chronic Issues    HPI Here for a scheduled follow up -  follow up regarding diabetes, hypertension and hypercholesterolemia.  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 RUS 5/13 - small kidney stone in the right kidney (4mm). Recommended f/u CT in 5 months. Recommended to discontinue tamsulosin and solifenacin. Recommended f/u CT and CXR 05/2024 - RCC surveillance.  Had f/u 06/02/24 - recommended continued surveillance with annual imaging and labs. Recommended f/u CXR and CT abd one year. Also bilateral nephrolithiasis - 24 hour urine collection. Saw Dr Theotis 06/17/24 - f/u OSA - on cpap. CT - scattered thin wall cyst- recommended to follow and weight loss. Saw Dr Florencio - 05/25/24 - f/u palpitations - stable - metoprolol . No changes made.  F/u with Dr Rennie - 04/29/24 - hgb 12.2 - hold on infusion venofer . Currently on low dose ozempic . Notices some nausea. Peppermint gum. Diarrhea is better. Still some diarrhea. Discussed continuing. F/u Dr Maryruth - fatty liver. Seeing psychiatry. Wanted to increase vibryd.    Past Medical History:  Diagnosis Date   Abnormal bleeding in menstrual cycle    Allergy    Anemia    Atypical endometrial hyperplasia    Cancer (HCC)    Clear cell carcinoma of kidney (HCC)    Clotting disorder    GYN issues   Diabetes mellitus (HCC)    Diarrhea    Endometrial hyperplasia without atypia, complex    Environmental allergies    GAD (generalized anxiety disorder)    Generalized anxiety disorder    GERD (gastroesophageal reflux disease)    Hypercholesterolemia    Hypertension    Insomnia    Iron  deficiency anemia    Leukocytosis    Major depressive disorder    Neuromuscular disorder (HCC)    Neuropathy   Numbness and tingling     Palpitations    Renal mass    Symptomatic anemia    Thrombocytosis    Type 2 diabetes mellitus without complications (HCC)    Vitamin D  deficiency    Past Surgical History:  Procedure Laterality Date   BIOPSY  09/30/2023   Procedure: BIOPSY;  Surgeon: Maryruth Ole DASEN, MD;  Location: [facility] ENDOSCOPY;  Service: Gastroenterology;;   COLONOSCOPY     COLONOSCOPY WITH PROPOFOL  N/A 05/28/2018   Procedure: COLONOSCOPY WITH PROPOFOL ;  Surgeon: Viktoria Lamar DASEN, MD;  Location: [facility] ENDOSCOPY;  Service: Endoscopy;  Laterality: N/A;   COLONOSCOPY WITH PROPOFOL  N/A 09/30/2023   Procedure: COLONOSCOPY WITH PROPOFOL ;  Surgeon: Maryruth Ole DASEN, MD;  Location: [facility] ENDOSCOPY;  Service: Gastroenterology;  Laterality: N/A;   DILATION AND CURETTAGE OF UTERUS     ESOPHAGOGASTRODUODENOSCOPY (EGD) WITH PROPOFOL  N/A 05/28/2018   Procedure: ESOPHAGOGASTRODUODENOSCOPY (EGD) WITH PROPOFOL ;  Surgeon: Viktoria Lamar DASEN, MD;  Location: [facility] ENDOSCOPY;  Service: Endoscopy;  Laterality: N/A;   ESOPHAGOGASTRODUODENOSCOPY (EGD) WITH PROPOFOL  N/A 09/30/2023   Procedure: ESOPHAGOGASTRODUODENOSCOPY (EGD) WITH PROPOFOL ;  Surgeon: Maryruth Ole DASEN, MD;  Location: [facility] ENDOSCOPY;  Service: Gastroenterology;  Laterality: N/A;   HYSTEROSCOPY WITH D & C     and polyp removal   POLYPECTOMY  09/30/2023   Procedure: POLYPECTOMY;  Surgeon: Maryruth Ole DASEN, MD;  Location: [facility] ENDOSCOPY;  Service: Gastroenterology;;   Family History  Problem Relation  Age of Onset   Hypertension Mother    Diabetes Mother    Diverticulosis Mother    Depression Mother    Hyperlipidemia Mother    Obesity Mother    Hypercholesterolemia Father    Hypertension Father    Cancer Father    Heart disease Father    Hyperlipidemia Father    Kidney disease Father    Miscarriages / Stillbirths Sister    Obesity Sister    Miscarriages / Stillbirths Sister    Heart disease Maternal Grandfather    Diverticulosis Paternal Grandmother     Hyperlipidemia Paternal Grandmother    Heart failure Paternal Grandmother    Cancer Maternal Grandmother    Alcoholism Paternal Uncle    ADD / ADHD Daughter    Depression Daughter    Anxiety disorder Daughter    Anxiety disorder Son    Obesity Brother    Social History   Socioeconomic History   Marital status: Single    Spouse name: Not on file   Number of children: 2   Years of education: Not on file   Highest education level: Associate degree: academic program  Occupational History    Employer: [employer]  Tobacco Use   Smoking status: Former    Current packs/day: 0.00    Types: Cigarettes    Quit date: 08/06/2000    Years since quitting: 23.9   Smokeless tobacco: Never  Vaping Use   Vaping status: Never Used  Substance and Sexual Activity   Alcohol use: Not Currently    Comment: rare   Drug use: No   Sexual activity: Yes    Birth control/protection: I.U.D.  Other Topics Concern   Not on file  Social History Narrative   Not on file   Social Drivers of Health   Financial Resource Strain: Low Risk  (06/19/2024)   Overall Financial Resource Strain (CARDIA)    Difficulty of Paying Living Expenses: Not hard at all  Food Insecurity: No Food Insecurity (06/19/2024)   Hunger Vital Sign    Worried About Running Out of Food in the Last Year: Never true    Ran Out of Food in the Last Year: Never true  Transportation Needs: No Transportation Needs (06/19/2024)   PRAPARE - Administrator, Civil Service (Medical): No    Lack of Transportation (Non-Medical): No  Physical Activity: Insufficiently Active (06/19/2024)   Exercise Vital Sign    Days of Exercise per Week: 1 day    Minutes of Exercise per Session: 10 min  Stress: Stress Concern Present (06/19/2024)   [employer] of Occupational Health - Occupational Stress Questionnaire    Feeling of Stress: Very much  Social Connections: Unknown (06/19/2024)   Social Connection and Isolation Panel     Frequency of Communication with Friends and Family: Patient declined    Frequency of Social Gatherings with Friends and Family: Patient declined    Attends Religious Services: Patient declined    Database Administrator or Organizations: No    Attends Engineer, Structural: Not on file    Marital Status: Patient declined     Review of Systems  Constitutional:  Negative for appetite change and unexpected weight change.  HENT:  Negative for congestion and sinus pressure.   Respiratory:  Negative for cough, chest tightness and shortness of breath.   Cardiovascular:  Negative for chest pain and palpitations.       No increased swelling.   Gastrointestinal:  Positive for diarrhea  and nausea. Negative for vomiting.  Genitourinary:  Negative for difficulty urinating and dysuria.  Musculoskeletal:  Negative for joint swelling and myalgias.  Skin:  Negative for color change and rash.  Neurological:  Negative for dizziness and headaches.  Psychiatric/Behavioral:  Negative for agitation.        Continue f/u with psychiatry. Discussed - confirms she would not hurt herself.        Objective:     BP 120/80   Pulse 95   Temp 98.4 F (36.9 C) (Oral)   Ht 5' 8 (1.727 m)   Wt (!) 312 lb (141.5 kg)   SpO2 96%   BMI 47.44 kg/m  Wt Readings from Last 3 Encounters:  06/19/24 (!) 312 lb (141.5 kg)  04/29/24 (!) 312 lb 11.2 oz (141.8 kg)  04/17/24 (!) 312 lb (141.5 kg)    Physical Exam Vitals reviewed.  Constitutional:      General: She is not in acute distress.    Appearance: Normal appearance.  HENT:     Head: Normocephalic and atraumatic.     Right Ear: External ear normal.     Left Ear: External ear normal.     Mouth/Throat:     Pharynx: No oropharyngeal exudate or posterior oropharyngeal erythema.  Eyes:     General: No scleral icterus.       Right eye: No discharge.        Left eye: No discharge.     Conjunctiva/sclera: Conjunctivae normal.  Neck:     Thyroid : No  thyromegaly.  Cardiovascular:     Rate and Rhythm: Normal rate and regular rhythm.  Pulmonary:     Effort: No respiratory distress.     Breath sounds: Normal breath sounds. No wheezing.  Abdominal:     General: Bowel sounds are normal.     Palpations: Abdomen is soft.     Tenderness: There is no abdominal tenderness.  Musculoskeletal:        General: No swelling or tenderness.     Cervical back: Neck supple. No tenderness.  Lymphadenopathy:     Cervical: No cervical adenopathy.  Skin:    Findings: No erythema or rash.  Neurological:     Mental Status: She is alert.  Psychiatric:        Mood and Affect: Mood normal.        Behavior: Behavior normal.         Outpatient Encounter Medications as of 06/19/2024  Medication Sig   albuterol  (VENTOLIN  HFA) 108 (90 Base) MCG/ACT inhaler Inhale 2 puffs into the lungs every 6 (six) hours as needed for wheezing. Patient request ProAir  brand only.   Cholecalciferol (VITAMIN D3) 50 MCG (2000 UT) capsule Take 2,000 Units by mouth daily.   Cranberry (RA CRANBERRY) 500 MG CAPS Take 500 mg by mouth daily.   D-Mannose 500 MG CAPS Take 500 mg by mouth in the morning, at noon, and at bedtime.   famotidine (PEPCID) 20 MG tablet TAKE 2 TABLETS (40 MG) BY MOUTH NIGHTLY   fluticasone  (FLONASE ) 50 MCG/ACT nasal spray Place 1 spray into both nostrils daily.   glucose blood test strip Use as directed to check sugars BID. Dx E11.9   Lancets MISC by Does not apply route. Use 1 lancets three times a day   levonorgestrel (MIRENA) 20 MCG/DAY IUD by Intrauterine route.   loratadine (CLARITIN) 10 MG tablet Take 10 mg by mouth daily.   Multiple Vitamin (MULTIVITAMIN) tablet Take 1 tablet by mouth daily.  nystatin  cream (MYCOSTATIN ) Apply 1 Application topically 2 (two) times daily.   Probiotic Product (FEM-DOPHILUS WOMENS PO) Take 1 tablet by mouth daily.   solifenacin (VESICARE) 5 MG tablet Take 5 mg by mouth daily.   tamsulosin (FLOMAX) 0.4 MG CAPS capsule  Take 0.4 mg by mouth daily.   Vilazodone  HCl (VIIBRYD ) 10 MG TABS Take 5 mg by mouth daily. (Patient taking differently: Take 10 mg by mouth daily.)   amLODipine  (NORVASC ) 10 MG tablet 1/2 tablet bid.   losartan  (COZAAR ) 100 MG tablet Take 1 tablet (100 mg total) by mouth daily.   metoprolol  succinate (TOPROL -XL) 100 MG 24 hr tablet 1/2 tablet bid. Take with or immediately following a meal.   pravastatin  (PRAVACHOL ) 40 MG tablet Take 1 tablet (40 mg total) by mouth daily.   Semaglutide ,0.25 or 0.5MG /DOS, (OZEMPIC , 0.25 OR 0.5 MG/DOSE,) 2 MG/3ML SOPN Inject 0.5 mg into the skin once a week.   [DISCONTINUED] amLODipine  (NORVASC ) 10 MG tablet Take 1/2 (one-half) tablet by mouth twice daily   [DISCONTINUED] losartan  (COZAAR ) 100 MG tablet Take 1 tablet by mouth once daily   [DISCONTINUED] metoprolol  succinate (TOPROL -XL) 100 MG 24 hr tablet TAKE 1/2 (ONE-HALF) TABLET BY MOUTH TWICE DAILY (TAKE  WITH  OR  IMMEDIATELY  FOLLOWING  A  MEAL)   [DISCONTINUED] pravastatin  (PRAVACHOL ) 40 MG tablet Take 1 tablet by mouth once daily   [DISCONTINUED] Semaglutide ,0.25 or 0.5MG /DOS, (OZEMPIC , 0.25 OR 0.5 MG/DOSE,) 2 MG/3ML SOPN Inject 0.5 mg into the skin once a week.   No facility-administered encounter medications on file as of 06/19/2024.     Lab Results  Component Value Date   WBC 12.1 (H) 06/19/2024   HGB 12.7 06/19/2024   HCT 39.4 06/19/2024   PLT 385 06/19/2024   GLUCOSE 138 (H) 06/19/2024   CHOL 236 (H) 06/19/2024   TRIG 358 (H) 06/19/2024   HDL 41 06/19/2024   LDLDIRECT 132.0 11/02/2020   LDLCALC 131 (H) 06/19/2024   ALT 26 06/19/2024   AST 28 06/19/2024   NA 138 06/19/2024   K 4.9 06/19/2024   CL 98 06/19/2024   CREATININE 0.58 06/19/2024   BUN 13 06/19/2024   CO2 21 06/19/2024   TSH 4.870 (H) 06/19/2024   INR 0.93 05/05/2018   HGBA1C 9.0 (H) 06/19/2024       Assessment & Plan:  Vitamin D  deficiency Assessment & Plan: Need to check vitamin D  level with next labs.    Type 2  diabetes mellitus with hyperglycemia, without long-term current use of insulin (HCC) -     Hemoglobin A1c -     Microalbumin / creatinine urine ratio  Symptomatic anemia -     Basic metabolic panel with GFR  Hypercholesterolemia Assessment & Plan: Continue pravastatin .  Diet and exercise.  Had previously discussed changing to crestor.  Had previously wanted to hold on change. Follow lipid panel. Continue diet and exercise.  Lab Results  Component Value Date   CHOL 236 (H) 06/19/2024   HDL 41 06/19/2024   LDLCALC 131 (H) 06/19/2024   LDLDIRECT 132.0 11/02/2020   TRIG 358 (H) 06/19/2024   CHOLHDL 5.8 (H) 06/19/2024     Orders: -     Lipid panel -     TSH -     Hepatic function panel  Thrombocytosis (HCC) Assessment & Plan: Has been evaluated by hematology.  Felt to be reactive.  Receiving iron  infusions. Continue f/u with hematology. Follow cbc.   Orders: -     CBC  with Differential/Platelet  Stress Assessment & Plan: Seeing psychiatry. On vibryd. Planning to increase the dose. Follow.    Splenic artery aneurysm Assessment & Plan: Saw Dr Marea. No further intervention at this time. Currently getting regular surveillance CTs - will follow with these CT.    Severe obesity (BMI >= 40) (HCC) Assessment & Plan: With associated diabetes and hypertension. Continues ozempic . Will continue current dose as outlined. Follow. Low carb diet and exercise.    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 RUS 5/13 - small kidney stone in the right kidney (4mm). Recommended f/u CT in 5 months. Recommended to discontinue tamsulosin and solifenacin. Recommended f/u CT and CXR 05/2024 - RCC surveillance.  Had f/u 06/02/24 - recommended continued surveillance with annual imaging and labs. Recommended f/u CXR and CT abd one year.    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 RUS 5/13 - small kidney stone in the right kidney (4mm). Recommended f/u CT in 5 months. Recommended to discontinue tamsulosin and solifenacin. Recommended f/u CT and CXR 05/2024 - RCC surveillance.  Had f/u 06/02/24 - recommended continued surveillance with annual imaging and labs. Recommended f/u CXR and CT abd one year.    Iron  deficiency anemia, unspecified iron  deficiency anemia type Assessment & Plan: Followed by hematology.    Primary hypertension Assessment & Plan: Continue losartan , metoprolol  and amlodipine .  Follow pressures. No changes in medication today. Blood pressure as outlined. Follow metabolic panel.    Gastroesophageal reflux disease without esophagitis Assessment & Plan: Continues on prilosec.    Fatty liver Assessment & Plan: Continue diet and exercise. Recent liver panel wnl. F/u - Dr Maryruth.    Other orders -     amLODIPine  Besylate; 1/2 tablet bid.  Dispense: 90 tablet; Refill: 1 -     Losartan  Potassium; Take 1 tablet (100 mg total) by mouth daily.  Dispense: 90 tablet; Refill: 1 -     Metoprolol  Succinate ER; 1/2 tablet bid. Take with or immediately following a meal.  Dispense: 90 tablet; Refill: 1 -     Pravastatin  Sodium; Take 1 tablet (40 mg total) by mouth daily.  Dispense: 90 tablet; Refill: 1 -     Ozempic  (0.25 or 0.5 MG/DOSE); Inject 0.5 mg into the skin once a week.  Dispense: 3 mL; Refill: 2   I spent 45 minutes with the patient.  Time spent discussing her current concerns and symptoms. Specifically time spent discussing her diabetes, ozempic , hypertension and f/u psych.  Time also spent discussing further w/up, evaluation and treatment.    Allena Hamilton, MD

## 2024-06-20 LAB — LIPID PANEL
Chol/HDL Ratio: 5.8 ratio — ABNORMAL HIGH (ref 0.0–4.4)
Cholesterol, Total: 236 mg/dL — ABNORMAL HIGH (ref 100–199)
HDL: 41 mg/dL (ref >39)
LDL Chol Calc (NIH): 131 mg/dL — ABNORMAL HIGH (ref 0–99)
Triglycerides: 358 mg/dL — ABNORMAL HIGH (ref 0–149)
VLDL Cholesterol Cal: 64 mg/dL — ABNORMAL HIGH (ref 5–40)

## 2024-06-20 LAB — CBC WITH DIFFERENTIAL/PLATELET
Basophils Absolute: 0.1 x10E3/uL (ref 0.0–0.2)
Basos: 1 %
EOS (ABSOLUTE): 0.2 x10E3/uL (ref 0.0–0.4)
Eos: 1 %
Hematocrit: 39.4 % (ref 34.0–46.6)
Hemoglobin: 12.7 g/dL (ref 11.1–15.9)
Immature Grans (Abs): 0.1 x10E3/uL (ref 0.0–0.1)
Immature Granulocytes: 1 %
Lymphocytes Absolute: 3.5 x10E3/uL — ABNORMAL HIGH (ref 0.7–3.1)
Lymphs: 29 %
MCH: 28.9 pg (ref 26.6–33.0)
MCHC: 32.2 g/dL (ref 31.5–35.7)
MCV: 90 fL (ref 79–97)
Monocytes Absolute: 0.7 x10E3/uL (ref 0.1–0.9)
Monocytes: 6 %
Neutrophils Absolute: 7.6 x10E3/uL — ABNORMAL HIGH (ref 1.4–7.0)
Neutrophils: 62 %
Platelets: 385 x10E3/uL (ref 150–450)
RBC: 4.39 x10E6/uL (ref 3.77–5.28)
RDW: 13.6 % (ref 11.7–15.4)
WBC: 12.1 x10E3/uL — ABNORMAL HIGH (ref 3.4–10.8)

## 2024-06-20 LAB — TSH: TSH: 4.87 u[IU]/mL — ABNORMAL HIGH (ref 0.450–4.500)

## 2024-06-20 LAB — HEPATIC FUNCTION PANEL
ALT: 26 IU/L (ref 0–32)
AST: 28 IU/L (ref 0–40)
Albumin: 4.7 g/dL (ref 3.9–4.9)
Alkaline Phosphatase: 83 IU/L (ref 41–116)
Bilirubin Total: 0.2 mg/dL (ref 0.0–1.2)
Bilirubin, Direct: 0.09 mg/dL (ref 0.00–0.40)
Total Protein: 7.5 g/dL (ref 6.0–8.5)

## 2024-06-20 LAB — BASIC METABOLIC PANEL WITH GFR
BUN/Creatinine Ratio: 22 (ref 9–23)
BUN: 13 mg/dL (ref 6–24)
CO2: 21 mmol/L (ref 20–29)
Calcium: 10.5 mg/dL — ABNORMAL HIGH (ref 8.7–10.2)
Chloride: 98 mmol/L (ref 96–106)
Creatinine, Ser: 0.58 mg/dL (ref 0.57–1.00)
Glucose: 138 mg/dL — ABNORMAL HIGH (ref 70–99)
Potassium: 4.9 mmol/L (ref 3.5–5.2)
Sodium: 138 mmol/L (ref 134–144)
eGFR: 114 mL/min/1.73 (ref >59)

## 2024-06-20 LAB — MICROALBUMIN / CREATININE URINE RATIO
Creatinine, Urine: 85.1 mg/dL
Microalb/Creat Ratio: 62 mg/g{creat} — ABNORMAL HIGH (ref 0–29)
Microalbumin, Urine: 52.5 ug/mL

## 2024-06-20 LAB — HEMOGLOBIN A1C
Est. average glucose Bld gHb Est-mCnc: 212 mg/dL
Hgb A1c MFr Bld: 9 % — ABNORMAL HIGH (ref 4.8–5.6)

## 2024-06-28 ENCOUNTER — Ambulatory Visit: Payer: Self-pay | Admitting: Internal Medicine

## 2024-06-28 ENCOUNTER — Other Ambulatory Visit: Payer: Self-pay | Admitting: Internal Medicine

## 2024-06-28 DIAGNOSIS — E559 Vitamin D deficiency, unspecified: Secondary | ICD-10-CM

## 2024-06-28 DIAGNOSIS — K760 Fatty (change of) liver, not elsewhere classified: Secondary | ICD-10-CM | POA: Insufficient documentation

## 2024-06-28 DIAGNOSIS — R7989 Other specified abnormal findings of blood chemistry: Secondary | ICD-10-CM

## 2024-06-28 NOTE — Assessment & Plan Note (Signed)
 Saw Dr Marea. No further intervention at this time. Currently getting regular surveillance CTs - will follow with these CT.

## 2024-06-28 NOTE — Assessment & Plan Note (Signed)
 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 RUS 5/13 - small kidney stone in the right kidney (4mm). Recommended f/u CT in 5 months. Recommended to discontinue tamsulosin and solifenacin. Recommended f/u CT and CXR 05/2024 - RCC surveillance.  Had f/u 06/02/24 - recommended continued surveillance with annual imaging and labs. Recommended f/u CXR and CT abd one year.

## 2024-06-28 NOTE — Assessment & Plan Note (Signed)
 Currently on ozempic  .5mg . Nausea as outlined. Still some diarrhea. Will hold on increasing the dose. Plans to increase vibryd. Prefers to change one medication at a time. Low carb diet and exercise. Follow met b and A1c.

## 2024-06-28 NOTE — Assessment & Plan Note (Signed)
Continues on prilosec.  

## 2024-06-28 NOTE — Assessment & Plan Note (Signed)
 Need to check vitamin D  level with next labs.

## 2024-06-28 NOTE — Assessment & Plan Note (Signed)
 Continue losartan , metoprolol  and amlodipine .  Follow pressures. No changes in medication today. Blood pressure as outlined. Follow metabolic panel.

## 2024-06-28 NOTE — Assessment & Plan Note (Signed)
 Has been evaluated by hematology.  Felt to be reactive.  Receiving iron  infusions. Continue f/u with hematology. Follow cbc.

## 2024-06-28 NOTE — Assessment & Plan Note (Signed)
 Seeing psychiatry. On vibryd. Planning to increase the dose. Follow.

## 2024-06-28 NOTE — Assessment & Plan Note (Signed)
 Followed by hematology

## 2024-06-28 NOTE — Assessment & Plan Note (Signed)
 With associated diabetes and hypertension. Continues ozempic . Will continue current dose as outlined. Follow. Low carb diet and exercise.

## 2024-06-28 NOTE — Progress Notes (Signed)
 Order placed for future labs

## 2024-06-28 NOTE — Assessment & Plan Note (Signed)
 Continue diet and exercise. Recent liver panel wnl. F/u - Dr Maryruth.

## 2024-06-28 NOTE — Assessment & Plan Note (Signed)
 Continue pravastatin .  Diet and exercise.  Had previously discussed changing to crestor.  Had previously wanted to hold on change. Follow lipid panel. Continue diet and exercise.  Lab Results  Component Value Date   CHOL 236 (H) 06/19/2024   HDL 41 06/19/2024   LDLCALC 131 (H) 06/19/2024   LDLDIRECT 132.0 11/02/2020   TRIG 358 (H) 06/19/2024   CHOLHDL 5.8 (H) 06/19/2024

## 2024-06-29 ENCOUNTER — Other Ambulatory Visit: Payer: Self-pay | Admitting: *Deleted

## 2024-06-29 DIAGNOSIS — R7989 Other specified abnormal findings of blood chemistry: Secondary | ICD-10-CM

## 2024-07-14 ENCOUNTER — Ambulatory Visit (INDEPENDENT_AMBULATORY_CARE_PROVIDER_SITE_OTHER): Admitting: Vascular Surgery

## 2024-07-14 ENCOUNTER — Encounter (INDEPENDENT_AMBULATORY_CARE_PROVIDER_SITE_OTHER): Payer: Self-pay | Admitting: Vascular Surgery

## 2024-07-22 ENCOUNTER — Telehealth: Admitting: Internal Medicine

## 2024-07-22 ENCOUNTER — Encounter: Payer: Self-pay | Admitting: Internal Medicine

## 2024-07-22 VITALS — Ht 68.0 in | Wt 312.0 lb

## 2024-07-22 DIAGNOSIS — I728 Aneurysm of other specified arteries: Secondary | ICD-10-CM | POA: Diagnosis not present

## 2024-07-22 DIAGNOSIS — I10 Essential (primary) hypertension: Secondary | ICD-10-CM | POA: Diagnosis not present

## 2024-07-22 DIAGNOSIS — N8501 Benign endometrial hyperplasia: Secondary | ICD-10-CM | POA: Diagnosis not present

## 2024-07-22 DIAGNOSIS — E1165 Type 2 diabetes mellitus with hyperglycemia: Secondary | ICD-10-CM | POA: Diagnosis not present

## 2024-07-22 DIAGNOSIS — K760 Fatty (change of) liver, not elsewhere classified: Secondary | ICD-10-CM | POA: Diagnosis not present

## 2024-07-22 DIAGNOSIS — F439 Reaction to severe stress, unspecified: Secondary | ICD-10-CM | POA: Diagnosis not present

## 2024-07-22 DIAGNOSIS — Z1231 Encounter for screening mammogram for malignant neoplasm of breast: Secondary | ICD-10-CM

## 2024-07-22 DIAGNOSIS — D75839 Thrombocytosis, unspecified: Secondary | ICD-10-CM

## 2024-07-22 DIAGNOSIS — D509 Iron deficiency anemia, unspecified: Secondary | ICD-10-CM | POA: Diagnosis not present

## 2024-07-22 NOTE — Progress Notes (Addendum)
 Patient ID: Lindsey Strickland, female   DOB: Dec 27, 1975, 48 y.o.   MRN: [id number]   Virtual Visit via video Note  I connected with Devanny Palecek today at by a video enabled telemedicine application and verified that I am speaking with the correct person using two identifiers. Location patient: home Location provider: work Persons participating in the virtual visit: patient, provider  The limitations, risks, security and privacy concerns of performing an evaluation and management service by video and the availability of in person appointments have been discussed. It has also been discussed with the patient that there may be a patient responsible charge related to this service. The patient expressed understanding and agreed to proceed.   Reason for visit: follow up appt.   HPI: Follow up regarding diabetes, hypercholesterolemia and hypertension. Evaluated 06/22/24 - high risk screening clinic - oncology. Recommended to continue screening mammogram. On ozempic  now. Appears to be tolerating. Off metformin . Diarrhea is better. Discussed recent labs. Discussed diet and exercise. Decreased sweets. Decreased bread. Discussed pravastatin  - possibly changing to crestor. Breathing stable. No chest pain reported. Handling stress. Seeing therapist.    ROS: See pertinent positives and negatives per HPI.  Past Medical History:  Diagnosis Date   Abnormal bleeding in menstrual cycle    Allergy    Anemia    Atypical endometrial hyperplasia    Cancer (HCC)    Clear cell carcinoma of kidney (HCC)    Clotting disorder    GYN issues   Diabetes mellitus (HCC)    Diarrhea    Endometrial hyperplasia without atypia, complex    Environmental allergies    GAD (generalized anxiety disorder)    Generalized anxiety disorder    GERD (gastroesophageal reflux disease)    Hypercholesterolemia    Hypertension    Insomnia    Iron  deficiency anemia    Leukocytosis    Major depressive disorder    Neuromuscular  disorder (HCC)    Neuropathy   Numbness and tingling    Palpitations    Renal mass    Symptomatic anemia    Thrombocytosis    Type 2 diabetes mellitus without complications (HCC)    Vitamin D  deficiency     Past Surgical History:  Procedure Laterality Date   BIOPSY  09/30/2023   Procedure: BIOPSY;  Surgeon: Maryruth Ole DASEN, MD;  Location: [facility] ENDOSCOPY;  Service: Gastroenterology;;   COLONOSCOPY     COLONOSCOPY WITH PROPOFOL  N/A 05/28/2018   Procedure: COLONOSCOPY WITH PROPOFOL ;  Surgeon: Viktoria Lamar DASEN, MD;  Location: [facility] ENDOSCOPY;  Service: Endoscopy;  Laterality: N/A;   COLONOSCOPY WITH PROPOFOL  N/A 09/30/2023   Procedure: COLONOSCOPY WITH PROPOFOL ;  Surgeon: Maryruth Ole DASEN, MD;  Location: [facility] ENDOSCOPY;  Service: Gastroenterology;  Laterality: N/A;   DILATION AND CURETTAGE OF UTERUS     ESOPHAGOGASTRODUODENOSCOPY (EGD) WITH PROPOFOL  N/A 05/28/2018   Procedure: ESOPHAGOGASTRODUODENOSCOPY (EGD) WITH PROPOFOL ;  Surgeon: Viktoria Lamar DASEN, MD;  Location: [facility] ENDOSCOPY;  Service: Endoscopy;  Laterality: N/A;   ESOPHAGOGASTRODUODENOSCOPY (EGD) WITH PROPOFOL  N/A 09/30/2023   Procedure: ESOPHAGOGASTRODUODENOSCOPY (EGD) WITH PROPOFOL ;  Surgeon: Maryruth Ole DASEN, MD;  Location: [facility] ENDOSCOPY;  Service: Gastroenterology;  Laterality: N/A;   HYSTEROSCOPY WITH D & C     and polyp removal   POLYPECTOMY  09/30/2023   Procedure: POLYPECTOMY;  Surgeon: Maryruth Ole DASEN, MD;  Location: [facility] ENDOSCOPY;  Service: Gastroenterology;;    Family History  Problem Relation Age of Onset   Hypertension Mother    Diabetes Mother  Diverticulosis Mother    Depression Mother    Hyperlipidemia Mother    Obesity Mother    Hypercholesterolemia Father    Hypertension Father    Cancer Father    Heart disease Father    Hyperlipidemia Father    Kidney disease Father    Miscarriages / Stillbirths Sister    Obesity Sister    Miscarriages / Stillbirths Sister    Heart disease Maternal  Grandfather    Diverticulosis Paternal Grandmother    Hyperlipidemia Paternal Grandmother    Heart failure Paternal Grandmother    Cancer Maternal Grandmother    Alcoholism Paternal Uncle    ADD / ADHD Daughter    Depression Daughter    Anxiety disorder Daughter    Anxiety disorder Son    Obesity Brother     SOCIAL HX: reviewed.   Current Medications[1]  EXAM:  GENERAL: alert, oriented, appears well and in no acute distress  HEENT: atraumatic, conjunttiva clear, no obvious abnormalities on inspection of external nose and ears  NECK: normal movements of the head and neck  LUNGS: on inspection no signs of respiratory distress, breathing rate appears normal, no obvious gross SOB, gasping or wheezing  CV: no obvious cyanosis  PSYCH/NEURO: pleasant and cooperative, no obvious depression or anxiety, speech and thought processing grossly intact  ASSESSMENT AND PLAN:  Discussed the following assessment and plan:  Problem List Items Addressed This Visit     Type 2 diabetes mellitus with hyperglycemia (HCC) - Primary   Currently on ozempic  .5mg . Discussed diet. Discussed exercise. Low carb diet and exercise. Follow met b and A1c. Wants to hold on changing medication at this time. Follow met b and A1c.       Thrombocytosis (HCC)   Has been evaluated by hematology.  Felt to be reactive.  Receiving iron  infusions. Continue f/u with hematology. Follow cbc.       Stress   Seeing psychiatry. On vibryd. Planning to increase the dose. Follow.       Splenic artery aneurysm   Saw Dr Marea. No further intervention at this time. Currently getting regular surveillance CTs - will follow with these CT.       Severe obesity (BMI >= 40) (HCC)   With associated diabetes and hypertension. Continues ozempic . Discussed diet and exercise. Follow.       Iron  deficiency anemia   Followed by hematology.       Hypertension   Continue losartan , metoprolol  and amlodipine .  Follow pressures.  Follow metabolic panel.       Hypercalcemia   Calcium slightly increased on recent lab testing. Recheck calcium and vitamin d  level. Follow. Stay hydrated.       Fatty liver   Continue diet and exercise. Follow liver function tests. Continue f/u with GI.       Endometrial hyperplasia without atypia, complex   Saw gyn - 01/07/24 - pap performed. Discussed hysterectomy - endometrial hyperplasia. Per note, referred to urogyn - stress incontinence.      Other Visit Diagnoses       Encounter for screening mammogram for malignant neoplasm of breast           Return for keep scheduled. .   I discussed the assessment and treatment plan with the patient. The patient was provided an opportunity to ask questions and all were answered. The patient agreed with the plan and demonstrated an understanding of the instructions.   The patient was advised to call back or seek an in-person  evaluation if the symptoms worsen or if the condition fails to improve as anticipated.    Allena Hamilton, MD       [1]  Current Outpatient Medications:    albuterol  (VENTOLIN  HFA) 108 (90 Base) MCG/ACT inhaler, Inhale 2 puffs into the lungs every 6 (six) hours as needed for wheezing. Patient request ProAir  brand only., Disp: 18 g, Rfl: 1   amLODipine  (NORVASC ) 10 MG tablet, 1/2 tablet bid., Disp: 90 tablet, Rfl: 1   Cholecalciferol (VITAMIN D3) 50 MCG (2000 UT) capsule, Take 2,000 Units by mouth daily., Disp: , Rfl:    D-Mannose 500 MG CAPS, Take 500 mg by mouth in the morning, at noon, and at bedtime., Disp: , Rfl:    famotidine (PEPCID) 20 MG tablet, TAKE 2 TABLETS (40 MG) BY MOUTH NIGHTLY, Disp: , Rfl:    fluticasone  (FLONASE ) 50 MCG/ACT nasal spray, Place 1 spray into both nostrils daily., Disp: 48 g, Rfl: 1   glucose blood test strip, Use as directed to check sugars BID. Dx E11.9, Disp: 100 each, Rfl: 12   Lancets MISC, by Does not apply route. Use 1 lancets three times a day, Disp: , Rfl:     levonorgestrel (MIRENA) 20 MCG/DAY IUD, by Intrauterine route., Disp: , Rfl:    loratadine (CLARITIN) 10 MG tablet, Take 10 mg by mouth daily., Disp: , Rfl:    losartan  (COZAAR ) 100 MG tablet, Take 1 tablet (100 mg total) by mouth daily., Disp: 90 tablet, Rfl: 1   metoprolol  succinate (TOPROL -XL) 100 MG 24 hr tablet, 1/2 tablet bid. Take with or immediately following a meal., Disp: 90 tablet, Rfl: 1   Multiple Vitamin (MULTIVITAMIN) tablet, Take 1 tablet by mouth daily., Disp: , Rfl:    nystatin  cream (MYCOSTATIN ), Apply 1 Application topically 2 (two) times daily., Disp: 30 g, Rfl: 0   pravastatin  (PRAVACHOL ) 40 MG tablet, Take 1 tablet (40 mg total) by mouth daily., Disp: 90 tablet, Rfl: 1   Probiotic Product (FEM-DOPHILUS WOMENS PO), Take 1 tablet by mouth daily., Disp: , Rfl:    Vilazodone  HCl (VIIBRYD ) 10 MG TABS, Take 5 mg by mouth daily., Disp: , Rfl:    Semaglutide , 1 MG/DOSE, 4 MG/3ML SOPN, Inject 1 mg as directed once a week., Disp: 3 mL, Rfl: 3   solifenacin (VESICARE) 5 MG tablet, Take 5 mg by mouth daily. (Patient not taking: Reported on 07/22/2024), Disp: , Rfl:    tamsulosin (FLOMAX) 0.4 MG CAPS capsule, Take 0.4 mg by mouth daily. (Patient not taking: Reported on 07/22/2024), Disp: , Rfl:

## 2024-07-23 ENCOUNTER — Ambulatory Visit: Admitting: Internal Medicine

## 2024-07-24 ENCOUNTER — Other Ambulatory Visit

## 2024-07-24 ENCOUNTER — Encounter: Payer: Self-pay | Admitting: Internal Medicine

## 2024-07-24 ENCOUNTER — Ambulatory Visit (INDEPENDENT_AMBULATORY_CARE_PROVIDER_SITE_OTHER): Admitting: Vascular Surgery

## 2024-07-26 ENCOUNTER — Encounter: Payer: Self-pay | Admitting: Internal Medicine

## 2024-07-27 MED ORDER — SEMAGLUTIDE (1 MG/DOSE) 4 MG/3ML ~~LOC~~ SOPN: 1.0000 mg | PEN_INJECTOR | SUBCUTANEOUS | 3 refills | Status: AC

## 2024-07-27 NOTE — Telephone Encounter (Signed)
 Rx sent in for ozempic  1mg  q week.

## 2024-07-31 ENCOUNTER — Encounter: Payer: Self-pay | Admitting: Internal Medicine

## 2024-07-31 NOTE — Assessment & Plan Note (Signed)
 Saw gyn - 01/07/24 - pap performed. Discussed hysterectomy - endometrial hyperplasia. Per note, referred to urogyn - stress incontinence.

## 2024-07-31 NOTE — Assessment & Plan Note (Signed)
 Saw Dr Marea. No further intervention at this time. Currently getting regular surveillance CTs - will follow with these CT.

## 2024-07-31 NOTE — Assessment & Plan Note (Signed)
Continue losartan, metoprolol and amlodipine.  Follow pressures.  Follow metabolic panel.  

## 2024-07-31 NOTE — Assessment & Plan Note (Signed)
 Continue diet and exercise. Follow liver function tests. Continue f/u with GI.

## 2024-07-31 NOTE — Assessment & Plan Note (Signed)
 Has been evaluated by hematology.  Felt to be reactive.  Receiving iron  infusions. Continue f/u with hematology. Follow cbc.

## 2024-07-31 NOTE — Assessment & Plan Note (Signed)
 With associated diabetes and hypertension. Continues ozempic . Discussed diet and exercise. Follow.

## 2024-07-31 NOTE — Assessment & Plan Note (Signed)
 Seeing psychiatry. On vibryd. Planning to increase the dose. Follow.

## 2024-07-31 NOTE — Assessment & Plan Note (Signed)
 Calcium slightly increased on recent lab testing. Recheck calcium and vitamin d  level. Follow. Stay hydrated.

## 2024-07-31 NOTE — Assessment & Plan Note (Signed)
 Currently on ozempic  .5mg . Discussed diet. Discussed exercise. Low carb diet and exercise. Follow met b and A1c. Wants to hold on changing medication at this time. Follow met b and A1c.

## 2024-07-31 NOTE — Assessment & Plan Note (Signed)
 Followed by hematology

## 2024-08-03 ENCOUNTER — Other Ambulatory Visit (INDEPENDENT_AMBULATORY_CARE_PROVIDER_SITE_OTHER)

## 2024-08-03 DIAGNOSIS — E559 Vitamin D deficiency, unspecified: Secondary | ICD-10-CM

## 2024-08-03 DIAGNOSIS — R7989 Other specified abnormal findings of blood chemistry: Secondary | ICD-10-CM

## 2024-08-04 LAB — VITAMIN D 25 HYDROXY (VIT D DEFICIENCY, FRACTURES): VITD: 24.21 ng/mL — ABNORMAL LOW (ref 30.00–100.00)

## 2024-08-04 LAB — T4, FREE: Free T4: 0.62 ng/dL (ref 0.60–1.60)

## 2024-08-04 LAB — TSH: TSH: 3.55 u[IU]/mL (ref 0.35–5.50)

## 2024-08-04 LAB — CALCIUM: Calcium: 9.8 mg/dL (ref 8.4–10.5)

## 2024-08-05 ENCOUNTER — Ambulatory Visit: Payer: Self-pay | Admitting: Internal Medicine

## 2024-09-08 ENCOUNTER — Encounter: Payer: Self-pay | Admitting: Internal Medicine

## 2024-09-09 MED ORDER — LOSARTAN POTASSIUM 100 MG PO TABS: 100.0000 mg | ORAL_TABLET | Freq: Every day | ORAL | 1 refills | Status: AC

## 2024-09-22 ENCOUNTER — Ambulatory Visit: Admitting: Internal Medicine

## 2024-09-25 ENCOUNTER — Ambulatory Visit: Admitting: Internal Medicine

## 2024-09-30 ENCOUNTER — Ambulatory Visit: Admitting: Internal Medicine

## 2024-10-27 ENCOUNTER — Other Ambulatory Visit

## 2024-10-27 ENCOUNTER — Ambulatory Visit: Admitting: Internal Medicine

## 2024-10-27 ENCOUNTER — Ambulatory Visit
